# Patient Record
Sex: Male | Born: 1960 | Race: Black or African American | Hispanic: No | Smoking: Former smoker
Health system: Southern US, Community
[De-identification: ages and names within clinical notes are randomized; demographics above are authoritative.]

## PROBLEM LIST (undated history)

## (undated) DIAGNOSIS — E785 Hyperlipidemia, unspecified: Secondary | ICD-10-CM

## (undated) DIAGNOSIS — F1910 Other psychoactive substance abuse, uncomplicated: Secondary | ICD-10-CM

## (undated) DIAGNOSIS — I639 Cerebral infarction, unspecified: Secondary | ICD-10-CM

## (undated) DIAGNOSIS — M1990 Unspecified osteoarthritis, unspecified site: Secondary | ICD-10-CM

## (undated) DIAGNOSIS — I10 Essential (primary) hypertension: Secondary | ICD-10-CM

## (undated) HISTORY — PX: KNEE SURGERY: SHX244

---

## 1999-01-26 ENCOUNTER — Encounter: Payer: Self-pay | Admitting: *Deleted

## 1999-01-26 ENCOUNTER — Emergency Department (HOSPITAL_COMMUNITY): Admission: EM | Admit: 1999-01-26 | Discharge: 1999-01-26 | Payer: Self-pay | Admitting: Emergency Medicine

## 1999-02-03 ENCOUNTER — Emergency Department (HOSPITAL_COMMUNITY): Admission: EM | Admit: 1999-02-03 | Discharge: 1999-02-03 | Payer: Self-pay | Admitting: Emergency Medicine

## 2013-02-28 ENCOUNTER — Emergency Department (HOSPITAL_COMMUNITY)
Admission: EM | Admit: 2013-02-28 | Discharge: 2013-02-28 | Disposition: A | Discharge status: Home / Self Care | Payer: PRIVATE HEALTH INSURANCE | Source: Home / Self Care

## 2013-02-28 ENCOUNTER — Encounter (HOSPITAL_COMMUNITY): Payer: Self-pay | Admitting: *Deleted

## 2013-02-28 DIAGNOSIS — H109 Unspecified conjunctivitis: Secondary | ICD-10-CM

## 2013-02-28 DIAGNOSIS — J309 Allergic rhinitis, unspecified: Secondary | ICD-10-CM

## 2013-02-28 DIAGNOSIS — J0100 Acute maxillary sinusitis, unspecified: Secondary | ICD-10-CM

## 2013-02-28 MED ORDER — FEXOFENADINE HCL 180 MG PO TABS: 180.0000 mg | ORAL_TABLET | Freq: Every day | ORAL | Status: DC

## 2013-02-28 MED ORDER — CEFUROXIME AXETIL 500 MG PO TABS: 500.0000 mg | ORAL_TABLET | Freq: Two times a day (BID) | ORAL | Status: DC

## 2013-02-28 MED ORDER — KETOTIFEN FUMARATE 0.025 % OP SOLN: 1.0000 [drp] | Freq: Two times a day (BID) | OPHTHALMIC | Status: DC

## 2013-02-28 NOTE — ED Notes (Signed)
Pt  Reo=ports  Symptoms  Of  Cough        Sinus  Congested  With  Foul  Drainage  Returned            He  Reports  r  Side  Of face  Is  Swollen and       He  Reports  A  History of  Sinus   Problems in past  He    denys  Any toothache  Or any  Dental pain

## 2013-02-28 NOTE — ED Provider Notes (Signed)
CSN: [id number]     Arrival date & time 02/28/13  1236 History   First MD Initiated Contact with Patient 02/28/13 1410     Chief Complaint  Patient presents with  . Facial Pain   (Consider location/radiation/quality/duration/timing/severity/associated sxs/prior Treatment) HPI Comments: 52 year old male presents with right maxillary facial pain and swelling for the past 2 days. It is worse when he is a time. This was preceded by typical allergy symptoms watery asked, NT asked, runny nose, nasal stuffiness,  and a foul tasting and smelling PND.   History reviewed. No pertinent past medical history. Past Surgical History  Procedure Laterality Date  . Knee surgery     History reviewed. No pertinent family history. History  Substance Use Topics  . Smoking status: Current Every Day Smoker  . Smokeless tobacco: Not on file  . Alcohol Use: Yes    Review of Systems  Constitutional: Positive for activity change. Negative for fever, diaphoresis and fatigue.  HENT: Positive for congestion, sore throat, facial swelling, rhinorrhea, postnasal drip and sinus pressure. Negative for ear pain, trouble swallowing, neck pain and neck stiffness.   Eyes: Negative for pain, discharge and redness.  Respiratory: Positive for cough. Negative for chest tightness and shortness of breath.   Cardiovascular: Negative.   Gastrointestinal: Negative.   Musculoskeletal: Negative.   Skin: Negative for rash.  Neurological: Negative.     Allergies  Review of patient's allergies indicates no known allergies.  Home Medications   Current Outpatient Rx  Name  Route  Sig  Dispense  Refill  . cefUROXime (CEFTIN) 500 MG tablet   Oral   Take 1 tablet (500 mg total) by mouth 2 (two) times daily.   20 tablet   0   . fexofenadine (ALLEGRA) 180 MG tablet   Oral   Take 1 tablet (180 mg total) by mouth daily.   30 tablet   0   . ketotifen (ZADITOR) 0.025 % ophthalmic solution   Both Eyes   Place 1 drop into  both eyes 2 (two) times daily.   5 mL   0    BP 143/81  Pulse 72  Temp(Src) 98.7 F (37.1 C) (Oral)  Resp 16  SpO2 100% Physical Exam  Nursing note and vitals reviewed. Constitutional: He is oriented to person, place, and time. He appears well-developed and well-nourished. No distress.  HENT:  Bilateral TMs are normal. Oropharynx is a combination of brown, erythema and greenish PND. Swelling of the right face primarily over the maxilla.  Eyes: EOM are normal. Pupils are equal, round, and reactive to light.  Bilateral conjunctiva with minor erythema and covered with a clear mucoid liquid.  Neck: Normal range of motion. Neck supple.  Cardiovascular: Normal rate, regular rhythm and normal heart sounds.   Pulmonary/Chest: Effort normal and breath sounds normal. No respiratory distress. He has no wheezes.  Musculoskeletal: Normal range of motion. He exhibits no edema.  Lymphadenopathy:    He has no cervical adenopathy.  Neurological: He is alert and oriented to person, place, and time.  Skin: Skin is warm and dry. No rash noted.  Psychiatric: He has a normal mood and affect.    ED Course  Procedures (including critical care time) Labs Review Labs Reviewed - No data to display Imaging Review No results found.  MDM   1. Sinusitis, acute maxillary   2. Allergic rhinitis due to allergen   3. Conjunctivitis of both eyes      Allegra 180 mg daily Continue the  Sudafed PE 10 mg daily 4 hours when necessary Ceftin 500 mg twice a day for 10 days Zaditor ophthalmic solution one drop each eye twice a day  Hayden Rasmussen, NP 02/28/13 1443

## 2013-03-01 ENCOUNTER — Telehealth (HOSPITAL_COMMUNITY): Payer: Self-pay | Admitting: Emergency Medicine

## 2013-03-01 NOTE — ED Notes (Signed)
Patient requesting change in antibiotic, to cheaper antibiotic.  Nathan Rasmussen, np agreed to change.  Nathan Rasmussen, np ordered augmentin 875mg  1 po bid x 10days, #20.  [facility] at [phone].  Called and left message on patient's cell phone [phone]

## 2013-03-01 NOTE — ED Provider Notes (Signed)
Medical screening examination/treatment/procedure(s) were performed by resident physician or non-physician practitioner and as supervising physician I was immediately available for consultation/collaboration.   KINDL,JAMES DOUGLAS MD.   James D Kindl, MD 03/01/13 0936 

## 2013-03-22 ENCOUNTER — Ambulatory Visit: Payer: Self-pay

## 2013-04-05 ENCOUNTER — Encounter: Payer: Self-pay | Admitting: Internal Medicine

## 2013-04-05 ENCOUNTER — Ambulatory Visit: Payer: PRIVATE HEALTH INSURANCE | Attending: Internal Medicine | Admitting: Internal Medicine

## 2013-04-05 VITALS — BP 186/110 | HR 69 | Temp 98.2°F | Resp 16 | Ht 72.0 in | Wt 170.0 lb

## 2013-04-05 DIAGNOSIS — Z7189 Other specified counseling: Secondary | ICD-10-CM

## 2013-04-05 DIAGNOSIS — M25569 Pain in unspecified knee: Secondary | ICD-10-CM | POA: Insufficient documentation

## 2013-04-05 DIAGNOSIS — I10 Essential (primary) hypertension: Secondary | ICD-10-CM | POA: Insufficient documentation

## 2013-04-05 DIAGNOSIS — Z7689 Persons encountering health services in other specified circumstances: Secondary | ICD-10-CM | POA: Insufficient documentation

## 2013-04-05 DIAGNOSIS — M25562 Pain in left knee: Secondary | ICD-10-CM

## 2013-04-05 DIAGNOSIS — H547 Unspecified visual loss: Secondary | ICD-10-CM

## 2013-04-05 LAB — CMP AND LIVER
ALT: 18 U/L (ref 0–53)
AST: 22 U/L (ref 0–37)
Albumin: 4.7 g/dL (ref 3.5–5.2)
Alkaline Phosphatase: 70 U/L (ref 39–117)
BUN: 5 mg/dL — ABNORMAL LOW (ref 6–23)
Bilirubin, Direct: 0.1 mg/dL (ref 0.0–0.3)
CO2: 25 mEq/L (ref 19–32)
Calcium: 9.7 mg/dL (ref 8.4–10.5)
Chloride: 102 mEq/L (ref 96–112)
Creat: 0.81 mg/dL (ref 0.50–1.35)
Glucose, Bld: 85 mg/dL (ref 70–99)
Indirect Bilirubin: 0.5 mg/dL (ref 0.0–0.9)
Potassium: 3.7 mEq/L (ref 3.5–5.3)
Sodium: 139 mEq/L (ref 135–145)
Total Bilirubin: 0.6 mg/dL (ref 0.3–1.2)
Total Protein: 8.1 g/dL (ref 6.0–8.3)

## 2013-04-05 LAB — CBC WITH DIFFERENTIAL/PLATELET
Basophils Absolute: 0 10*3/uL (ref 0.0–0.1)
Basophils Relative: 1 % (ref 0–1)
Eosinophils Absolute: 0.3 10*3/uL (ref 0.0–0.7)
Eosinophils Relative: 4 % (ref 0–5)
HCT: 43.5 % (ref 39.0–52.0)
Hemoglobin: 15.2 g/dL (ref 13.0–17.0)
Lymphocytes Relative: 33 % (ref 12–46)
Lymphs Abs: 2.5 10*3/uL (ref 0.7–4.0)
MCH: 28.5 pg (ref 26.0–34.0)
MCHC: 34.9 g/dL (ref 30.0–36.0)
MCV: 81.6 fL (ref 78.0–100.0)
Monocytes Absolute: 1.1 10*3/uL — ABNORMAL HIGH (ref 0.1–1.0)
Monocytes Relative: 14 % — ABNORMAL HIGH (ref 3–12)
Neutro Abs: 3.8 10*3/uL (ref 1.7–7.7)
Neutrophils Relative %: 48 % (ref 43–77)
Platelets: 328 10*3/uL (ref 150–400)
RBC: 5.33 MIL/uL (ref 4.22–5.81)
RDW: 15.3 % (ref 11.5–15.5)
WBC: 7.7 10*3/uL (ref 4.0–10.5)

## 2013-04-05 LAB — LIPID PANEL
Cholesterol: 256 mg/dL — ABNORMAL HIGH (ref 0–200)
HDL: 73 mg/dL (ref >39)
LDL Cholesterol: 163 mg/dL — ABNORMAL HIGH (ref 0–99)
Total CHOL/HDL Ratio: 3.5 Ratio
Triglycerides: 101 mg/dL (ref <150)
VLDL: 20 mg/dL (ref 0–40)

## 2013-04-05 MED ORDER — NAPROXEN 500 MG PO TABS: 500.0000 mg | ORAL_TABLET | Freq: Two times a day (BID) | ORAL | Status: DC

## 2013-04-05 NOTE — Progress Notes (Signed)
Pt is here today to establish care. Pt reports having sciatic nerve pain. Pt had orthoscopic SX on both knees years ago and he is now having pain and having knee trouble recently.

## 2013-04-05 NOTE — Patient Instructions (Signed)
DASH Diet The DASH diet stands for "Dietary Approaches to Stop Hypertension." It is a healthy eating plan that has been shown to reduce high blood pressure (hypertension) in as little as 14 days, while also possibly providing other significant health benefits. These other health benefits include reducing the risk of breast cancer after menopause and reducing the risk of type 2 diabetes, heart disease, colon cancer, and stroke. Health benefits also include weight loss and slowing kidney failure in patients with chronic kidney disease.  DIET GUIDELINES  Limit salt (sodium). Your diet should contain less than 1500 mg of sodium daily.  Limit refined or processed carbohydrates. Your diet should include mostly whole grains. Desserts and added sugars should be used sparingly.  Include small amounts of heart-healthy fats. These types of fats include nuts, oils, and tub margarine. Limit saturated and trans fats. These fats have been shown to be harmful in the body. CHOOSING FOODS  The following food groups are based on a 2000 calorie diet. See your Registered Dietitian for individual calorie needs. Grains and Grain Products (6 to 8 servings daily)  Eat More Often: Whole-wheat bread, brown rice, whole-grain or wheat pasta, quinoa, popcorn without added fat or salt (air popped).  Eat Less Often: White bread, white pasta, white rice, cornbread. Vegetables (4 to 5 servings daily)  Eat More Often: Fresh, frozen, and canned vegetables. Vegetables may be raw, steamed, roasted, or grilled with a minimal amount of fat.  Eat Less Often/Avoid: Creamed or fried vegetables. Vegetables in a cheese sauce. Fruit (4 to 5 servings daily)  Eat More Often: All fresh, canned (in natural juice), or frozen fruits. Dried fruits without added sugar. One hundred percent fruit juice ( cup [237 mL] daily).  Eat Less Often: Dried fruits with added sugar. Canned fruit in light or heavy syrup. [employer], Fish, and Poultry (2  servings or less daily. One serving is 3 to 4 oz [85-114 g]).  Eat More Often: Ninety percent or leaner ground beef, tenderloin, sirloin. Round cuts of beef, chicken breast, [place] breast. All fish. Grill, bake, or broil your meat. Nothing should be fried.  Eat Less Often/Avoid: Fatty cuts of meat, [place], or chicken leg, thigh, or wing. Fried cuts of meat or fish. Dairy (2 to 3 servings)  Eat More Often: Low-fat or fat-free milk, low-fat plain or light yogurt, reduced-fat or part-skim cheese.  Eat Less Often/Avoid: Milk (whole, 2%).Whole milk yogurt. Full-fat cheeses. Nuts, Seeds, and Legumes (4 to 5 servings per week)  Eat More Often: All without added salt.  Eat Less Often/Avoid: Salted nuts and seeds, canned beans with added salt. Fats and Sweets (limited)  Eat More Often: Vegetable oils, tub margarines without trans fats, sugar-free gelatin. Mayonnaise and salad dressings.  Eat Less Often/Avoid: Coconut oils, palm oils, butter, stick margarine, cream, half and half, cookies, candy, pie. FOR MORE INFORMATION The Dash Diet Eating Plan: www.dashdiet.org Document Released: 05/12/2011 Document Revised: 08/15/2011 Document Reviewed: 05/12/2011 [facility] Patient Information 2014 [place], [place]. Knee Pain The knee is the complex joint between your thigh and your lower leg. It is made up of bones, tendons, ligaments, and cartilage. The bones that make up the knee are:  The femur in the thigh.  The tibia and fibula in the lower leg.  The patella or kneecap riding in the groove on the lower femur. CAUSES  Knee pain is a common complaint with many causes. A few of these causes are:  Injury, such as:  A ruptured ligament or tendon  injury.  Torn cartilage.  Medical conditions, such as:  Gout  Arthritis  Infections  Overuse, over training or overdoing a physical activity. Knee pain can be minor or severe. Knee pain can accompany debilitating injury. Minor knee problems often  respond well to self-care measures or get well on their own. More serious injuries may need medical intervention or even surgery. SYMPTOMS The knee is complex. Symptoms of knee problems can vary widely. Some of the problems are:  Pain with movement and weight bearing.  Swelling and tenderness.  Buckling of the knee.  Inability to straighten or extend your knee.  Your knee locks and you cannot straighten it.  Warmth and redness with pain and fever.  Deformity or dislocation of the kneecap. DIAGNOSIS  Determining what is wrong may be very straight forward such as when there is an injury. It can also be challenging because of the complexity of the knee. Tests to make a diagnosis may include:  Your caregiver taking a history and doing a physical exam.  Routine X-rays can be used to rule out other problems. X-rays will not reveal a cartilage tear. Some injuries of the knee can be diagnosed by:  Arthroscopy a surgical technique by which a small video camera is inserted through tiny incisions on the sides of the knee. This procedure is used to examine and repair internal knee joint problems. Tiny instruments can be used during arthroscopy to repair the torn knee cartilage (meniscus).  Arthrography is a radiology technique. A contrast liquid is directly injected into the knee joint. Internal structures of the knee joint then become visible on X-ray film.  An MRI scan is a non x-ray radiology procedure in which magnetic fields and a computer produce two- or three-dimensional images of the inside of the knee. Cartilage tears are often visible using an MRI scanner. MRI scans have largely replaced arthrography in diagnosing cartilage tears of the knee.  Blood work.  Examination of the fluid that helps to lubricate the knee joint (synovial fluid). This is done by taking a sample out using a needle and a syringe. TREATMENT The treatment of knee problems depends on the cause. Some of these  treatments are:  Depending on the injury, proper casting, splinting, surgery or physical therapy care will be needed.  Give yourself adequate recovery time. Do not overuse your joints. If you begin to get sore during workout routines, back off. Slow down or do fewer repetitions.  For repetitive activities such as cycling or running, maintain your strength and nutrition.  Alternate muscle groups. For example if you are a weight lifter, work the upper body on one day and the lower body the next.  Either tight or weak muscles do not give the proper support for your knee. Tight or weak muscles do not absorb the stress placed on the knee joint. Keep the muscles surrounding the knee strong.  Take care of mechanical problems.  If you have flat feet, orthotics or special shoes may help. See your caregiver if you need help.  Arch supports, sometimes with wedges on the inner or outer aspect of the heel, can help. These can shift pressure away from the side of the knee most bothered by osteoarthritis.  A brace called an "unloader" brace also may be used to help ease the pressure on the most arthritic side of the knee.  If your caregiver has prescribed crutches, braces, wraps or ice, use as directed. The acronym for this is PRICE. This means protection, rest,  ice, compression and elevation.  Nonsteroidal anti-inflammatory drugs (NSAID's), can help relieve pain. But if taken immediately after an injury, they may actually increase swelling. Take NSAID's with food in your stomach. Stop them if you develop stomach problems. Do not take these if you have a history of ulcers, stomach pain or bleeding from the bowel. Do not take without your caregiver's approval if you have problems with fluid retention, heart failure, or kidney problems.  For ongoing knee problems, physical therapy may be helpful.  Glucosamine and chondroitin are over-the-counter dietary supplements. Both may help relieve the pain of  osteoarthritis in the knee. These medicines are different from the usual anti-inflammatory drugs. Glucosamine may decrease the rate of cartilage destruction.  Injections of a corticosteroid drug into your knee joint may help reduce the symptoms of an arthritis flare-up. They may provide pain relief that lasts a few months. You may have to wait a few months between injections. The injections do have a small increased risk of infection, water retention and elevated blood sugar levels.  Hyaluronic acid injected into damaged joints may ease pain and provide lubrication. These injections may work by reducing inflammation. A series of shots may give relief for as long as 6 months.  Topical painkillers. Applying certain ointments to your skin may help relieve the pain and stiffness of osteoarthritis. Ask your pharmacist for suggestions. Many over the-counter products are approved for temporary relief of arthritis pain.  In some countries, doctors often prescribe topical NSAID's for relief of chronic conditions such as arthritis and tendinitis. A review of treatment with NSAID creams found that they worked as well as oral medications but without the serious side effects. PREVENTION  Maintain a healthy weight. Extra pounds put more strain on your joints.  Get strong, stay limber. Weak muscles are a common cause of knee injuries. Stretching is important. Include flexibility exercises in your workouts.  Be smart about exercise. If you have osteoarthritis, chronic knee pain or recurring injuries, you may need to change the way you exercise. This does not mean you have to stop being active. If your knees ache after jogging or playing basketball, consider switching to swimming, water aerobics or other low-impact activities, at least for a few days a week. Sometimes limiting high-impact activities will provide relief.  Make sure your shoes fit well. Choose footwear that is right for your sport.  Protect your  knees. Use the proper gear for knee-sensitive activities. Use kneepads when playing volleyball or laying carpet. Buckle your seat belt every time you drive. Most shattered kneecaps occur in car accidents.  Rest when you are tired. SEEK MEDICAL CARE IF:  You have knee pain that is continual and does not seem to be getting better.  SEEK IMMEDIATE MEDICAL CARE IF:  Your knee joint feels hot to the touch and you have a high fever. MAKE SURE YOU:   Understand these instructions.  Will watch your condition.  Will get help right away if you are not doing well or get worse. Document Released: 03/20/2007 Document Revised: 08/15/2011 Document Reviewed: 03/20/2007 [facility] Patient Information 2014 [place], [place].

## 2013-04-05 NOTE — Progress Notes (Signed)
Patient ID: Nathan Hernandez, male   DOB: Dec 12, 1960, 52 y.o.   MRN: [id number] Patient Demographics  Nathan Hernandez, is a 52 y.o. male  WUJ:[id number]  NFA:[id number]  DOB - Aug 03, 1960  CC:  Chief Complaint  Patient presents with  . Establish Care       HPI: Nathan Hernandez is a 52 y.o. male here today to establish medical care. Patient has no significant past medical history. He is not on any chronic medication. His major complaint today is right knee pain. He has had arthroscopy in both knees long time ago, he claims to have had torn ligaments due to excessive sport at younger age, he did well following surgery until lately when the right knee begins to give him trouble, he now wears braces and uses ibuprofen when necessary. He claims never to have been diagnosed with high blood pressure no diabetes but there is family history of both, hypertension in his mother and diabetes in father. He smokes cigar and drinks alcohol at least 2 cans of beer daily Patient has No headache, No chest pain, No abdominal pain - No Nausea, No new weakness tingling or numbness, No Cough - SOB.  No Known Allergies History reviewed. No pertinent past medical history. Current Outpatient Prescriptions on File Prior to Visit  Medication Sig Dispense Refill  . cefUROXime (CEFTIN) 500 MG tablet Take 1 tablet (500 mg total) by mouth 2 (two) times daily.  20 tablet  0  . fexofenadine (ALLEGRA) 180 MG tablet Take 1 tablet (180 mg total) by mouth daily.  30 tablet  0  . ketotifen (ZADITOR) 0.025 % ophthalmic solution Place 1 drop into both eyes 2 (two) times daily.  5 mL  0   No current facility-administered medications on file prior to visit.   History reviewed. No pertinent family history. History   Social History  . Marital Status: Legally Separated    Spouse Name: N/A    Number of Children: N/A  . Years of Education: N/A   Occupational History  . Not on file.   Social History Main Topics  . Smoking  status: Current Every Day Smoker -- 1.00 packs/day    Types: Cigars  . Smokeless tobacco: Not on file  . Alcohol Use: Yes  . Drug Use: 7.00 per week    Special: Marijuana  . Sexual Activity: Not on file   Other Topics Concern  . Not on file   Social History Narrative  . No narrative on file    Review of Systems: Constitutional: Negative for fever, chills, diaphoresis, activity change, appetite change and fatigue. HENT: Negative for ear pain, nosebleeds, congestion, facial swelling, rhinorrhea, neck pain, neck stiffness and ear discharge.  Eyes: Negative for pain, discharge, redness, itching and visual disturbance. Respiratory: Negative for cough, choking, chest tightness, shortness of breath, wheezing and stridor.  Cardiovascular: Negative for chest pain, palpitations and leg swelling. Gastrointestinal: Negative for abdominal distention. Genitourinary: Negative for dysuria, urgency, frequency, hematuria, flank pain, decreased urine volume, difficulty urinating and dyspareunia.  Musculoskeletal: Negative for back pain, joint swelling, arthralgia and gait problem. Neurological: Negative for dizziness, tremors, seizures, syncope, facial asymmetry, speech difficulty, weakness, light-headedness, numbness and headaches.  Hematological: Negative for adenopathy. Does not bruise/bleed easily. Psychiatric/Behavioral: Negative for hallucinations, behavioral problems, confusion, dysphoric mood, decreased concentration and agitation.    Objective:   Filed Vitals:   04/05/13 1419  BP: 186/110  Pulse: 69  Temp: 98.2 F (36.8 C)  Resp: 16    Physical Exam: Constitutional: Patient  appears well-developed and well-nourished. No distress. HENT: Normocephalic, atraumatic, External right and left ear normal. Oropharynx is clear and moist.  Eyes: Conjunctivae and EOM are normal. PERRLA, no scleral icterus. Neck: Normal ROM. Neck supple. No JVD. No tracheal deviation. No thyromegaly. CVS: RRR,  S1/S2 +, no murmurs, no gallops, no carotid bruit.  Pulmonary: Effort and breath sounds normal, no stridor, rhonchi, wheezes, rales.  Abdominal: Soft. BS +, no distension, tenderness, rebound or guarding.  Musculoskeletal: Normal range of motion. No edema and no tenderness.  Lymphadenopathy: No lymphadenopathy noted, cervical, inguinal or axillary Neuro: Alert. Normal reflexes, muscle tone coordination. No cranial nerve deficit. Skin: Skin is warm and dry. No rash noted. Not diaphoretic. No erythema. No pallor. Psychiatric: Normal mood and affect. Behavior, judgment, thought content normal.  No results found for this basename: WBC, HGB, HCT, MCV, PLT   No results found for this basename: CREATININE, BUN, NA, K, CL, CO2    No results found for this basename: HGBA1C   Lipid Panel  No results found for this basename: chol, trig, hdl, cholhdl, vldl, ldlcalc       Assessment and plan:   Patient Active Problem List   Diagnosis Date Noted  . Establishing care with new doctor, encounter for 04/05/2013  . Poor vision 04/05/2013  . High blood pressure 04/05/2013  . Knee pain, acute 04/05/2013    Plan: Comprehensive metabolic panel CBC D. Complete urinalysis Lipid panel Hemoglobin A1c  Right Knee pain X-ray right knee  Naproxen 500 mg tablet by mouth twice a day  Patient counseled extensively about smoking cessation Patient counseled extensively about nutrition and exercise  Pre-hypertension Patient has been instructed to record ambulatory blood pressure and bring the log in one week, if blood pressure remains above 140/90, we'll start antihypertensive     Follow up in one week for blood pressure check   The patient was given clear instructions to go to ER or return to medical center if symptoms don't improve, worsen or new problems develop. The patient verbalized understanding. The patient was told to call to get lab results if they haven't heard anything in the next week.      Jeanann Lewandowsky, MD, MHA, FACP, FAAP [facility] [place], [place] [phone]   04/05/2013, 2:54 PM

## 2013-04-06 LAB — URINALYSIS, COMPLETE
Bacteria, UA: NONE SEEN
Bilirubin Urine: NEGATIVE
Casts: NONE SEEN
Crystals: NONE SEEN
Glucose, UA: NEGATIVE mg/dL
Hgb urine dipstick: NEGATIVE
Ketones, ur: NEGATIVE mg/dL
Leukocytes, UA: NEGATIVE
Nitrite: NEGATIVE
Protein, ur: NEGATIVE mg/dL
Specific Gravity, Urine: 1.005 (ref 1.005–1.030)
Urobilinogen, UA: 0.2 mg/dL (ref 0.0–1.0)
pH: 6.5 (ref 5.0–8.0)

## 2013-04-06 LAB — VITAMIN D 25 HYDROXY (VIT D DEFICIENCY, FRACTURES): Vit D, 25-Hydroxy: 11 ng/mL — ABNORMAL LOW (ref 30–89)

## 2013-07-03 ENCOUNTER — Ambulatory Visit: Payer: Self-pay

## 2013-07-05 ENCOUNTER — Ambulatory Visit: Payer: Self-pay | Admitting: Internal Medicine

## 2013-07-29 ENCOUNTER — Ambulatory Visit: Payer: No Typology Code available for payment source | Attending: Internal Medicine | Admitting: Internal Medicine

## 2013-07-29 ENCOUNTER — Encounter: Payer: Self-pay | Admitting: Internal Medicine

## 2013-07-29 ENCOUNTER — Ambulatory Visit: Payer: No Typology Code available for payment source | Attending: Internal Medicine

## 2013-07-29 VITALS — BP 146/84 | HR 62 | Temp 98.7°F | Resp 14 | Ht 72.0 in | Wt 178.0 lb

## 2013-07-29 DIAGNOSIS — M25561 Pain in right knee: Secondary | ICD-10-CM

## 2013-07-29 DIAGNOSIS — Z0000 Encounter for general adult medical examination without abnormal findings: Secondary | ICD-10-CM

## 2013-07-29 DIAGNOSIS — M545 Low back pain, unspecified: Secondary | ICD-10-CM | POA: Insufficient documentation

## 2013-07-29 DIAGNOSIS — F17200 Nicotine dependence, unspecified, uncomplicated: Secondary | ICD-10-CM | POA: Insufficient documentation

## 2013-07-29 DIAGNOSIS — M25569 Pain in unspecified knee: Secondary | ICD-10-CM | POA: Insufficient documentation

## 2013-07-29 DIAGNOSIS — Z79899 Other long term (current) drug therapy: Secondary | ICD-10-CM | POA: Insufficient documentation

## 2013-07-29 MED ORDER — MELOXICAM 15 MG PO TABS: 15.0000 mg | ORAL_TABLET | Freq: Every day | ORAL | Status: DC

## 2013-07-29 NOTE — Progress Notes (Signed)
Patient is here for a follow up and check up. Complains of Rt knee pain x4 years. Has not went for imaging for his knee yet. Has some stiffness in knee, but no real pain. Pain only occurs while walking or sitting too long. Pain spreads from lower back down to Rt leg.

## 2013-07-29 NOTE — Progress Notes (Signed)
Patient ID: Nathan Hernandez, male   DOB: 09-05-1960, 53 y.o.   MRN: [id number]   CC:  HPI: Patient here for followup of right knee pain, currently taking ibuprofen as needed. The patient complains of pain in his lower back as well as his hip. Denies any numbness or tingling in his leg.  No Known Allergies No past medical history on file. Current Outpatient Prescriptions on File Prior to Visit  Medication Sig Dispense Refill  . cefUROXime (CEFTIN) 500 MG tablet Take 1 tablet (500 mg total) by mouth 2 (two) times daily.  20 tablet  0  . fexofenadine (ALLEGRA) 180 MG tablet Take 1 tablet (180 mg total) by mouth daily.  30 tablet  0  . ketotifen (ZADITOR) 0.025 % ophthalmic solution Place 1 drop into both eyes 2 (two) times daily.  5 mL  0  . naproxen (NAPROSYN) 500 MG tablet Take 1 tablet (500 mg total) by mouth 2 (two) times daily with a meal.  30 tablet  0   No current facility-administered medications on file prior to visit.   No family history on file. History   Social History  . Marital Status: Legally Separated    Spouse Name: N/A    Number of Children: N/A  . Years of Education: N/A   Occupational History  . Not on file.   Social History Main Topics  . Smoking status: Current Every Day Smoker -- 1.00 packs/day    Types: Cigars  . Smokeless tobacco: Not on file  . Alcohol Use: Yes  . Drug Use: 7.00 per week    Special: Marijuana  . Sexual Activity: Not on file   Other Topics Concern  . Not on file   Social History Narrative  . No narrative on file    Review of Systems  Constitutional: Negative for fever, chills, diaphoresis, activity change, appetite change and fatigue.  HENT: Negative for ear pain, nosebleeds, congestion, facial swelling, rhinorrhea, neck pain, neck stiffness and ear discharge.   Eyes: Negative for pain, discharge, redness, itching and visual disturbance.  Respiratory: Negative for cough, choking, chest tightness, shortness of breath, wheezing  and stridor.   Cardiovascular: Negative for chest pain, palpitations and leg swelling.  Gastrointestinal: Negative for abdominal distention.  Genitourinary: Negative for dysuria, urgency, frequency, hematuria, flank pain, decreased urine volume, difficulty urinating and dyspareunia.  Musculoskeletal: Negative for back pain, joint swelling, arthralgias and gait problem.  Neurological: Negative for dizziness, tremors, seizures, syncope, facial asymmetry, speech difficulty, weakness, light-headedness, numbness and headaches.  Hematological: Negative for adenopathy. Does not bruise/bleed easily.  Psychiatric/Behavioral: Negative for hallucinations, behavioral problems, confusion, dysphoric mood, decreased concentration and agitation.    Objective:   Filed Vitals:   07/29/13 1021  BP: 146/84  Pulse: 62  Temp: 98.7 F (37.1 C)  Resp: 14    Physical Exam  Constitutional: Appears well-developed and well-nourished. No distress.  HENT: Normocephalic. External right and left ear normal. Oropharynx is clear and moist.  Eyes: Conjunctivae and EOM are normal. PERRLA, no scleral icterus.  Neck: Normal ROM. Neck supple. No JVD. No tracheal deviation. No thyromegaly.  CVS: RRR, S1/S2 +, no murmurs, no gallops, no carotid bruit.  Pulmonary: Effort and breath sounds normal, no stridor, rhonchi, wheezes, rales.  Abdominal: Soft. BS +,  no distension, tenderness, rebound or guarding.  Musculoskeletal: Normal range of motion. No edema and no tenderness.  Lymphadenopathy: No lymphadenopathy noted, cervical, inguinal. Neuro: Alert. Normal reflexes, muscle tone coordination. No cranial nerve deficit. Skin: Skin is  warm and dry. No rash noted. Not diaphoretic. No erythema. No pallor.  Psychiatric: Normal mood and affect. Behavior, judgment, thought content normal.   Lab Results  Component Value Date   WBC 7.7 04/05/2013   HGB 15.2 04/05/2013   HCT 43.5 04/05/2013   MCV 81.6 04/05/2013   PLT 328  04/05/2013   Lab Results  Component Value Date   CREATININE 0.81 04/05/2013   BUN 5* 04/05/2013   NA 139 04/05/2013   K 3.7 04/05/2013   CL 102 04/05/2013   CO2 25 04/05/2013    No results found for this basename: HGBA1C   Lipid Panel     Component Value Date/Time   CHOL 256* 04/05/2013 1456   TRIG 101 04/05/2013 1456   HDL 73 04/05/2013 1456   CHOLHDL 3.5 04/05/2013 1456   VLDL 20 04/05/2013 1456   LDLCALC 163* 04/05/2013 1456       Assessment and plan:   Patient Active Problem List   Diagnosis Date Noted  . Establishing care with new doctor, encounter for 04/05/2013  . Poor vision 04/05/2013  . High blood pressure 04/05/2013  . Knee pain, acute 04/05/2013   Right knee pain Patient had arthroscopic surgery in the 1990s    Will obtain MRI of the right knee X-rays of the lumbar spine and right hip X. Start the patient on meloxicam Sports medicine referral possible corticosteroid injection in the joint  Follow up in 3 months   The patient was given clear instructions to go to ER or return to medical center if symptoms don't improve, worsen or new problems develop. The patient verbalized understanding. The patient was told to call to get any lab results if not heard anything in the next week.

## 2013-08-12 ENCOUNTER — Ambulatory Visit (HOSPITAL_COMMUNITY): Admission: RE | Admit: 2013-08-12 | Payer: Self-pay | Source: Ambulatory Visit

## 2013-10-24 ENCOUNTER — Ambulatory Visit: Payer: Self-pay | Admitting: Internal Medicine

## 2013-11-06 ENCOUNTER — Ambulatory Visit: Payer: Self-pay | Admitting: Internal Medicine

## 2013-11-12 ENCOUNTER — Ambulatory Visit (HOSPITAL_COMMUNITY)
Admission: RE | Admit: 2013-11-12 | Discharge: 2013-11-12 | Disposition: A | Discharge status: Home / Self Care | Payer: No Typology Code available for payment source | Source: Ambulatory Visit | Attending: Internal Medicine | Admitting: Internal Medicine

## 2013-11-12 DIAGNOSIS — M1710 Unilateral primary osteoarthritis, unspecified knee: Secondary | ICD-10-CM | POA: Insufficient documentation

## 2013-11-12 DIAGNOSIS — M25561 Pain in right knee: Secondary | ICD-10-CM

## 2013-11-12 DIAGNOSIS — IMO0002 Reserved for concepts with insufficient information to code with codable children: Secondary | ICD-10-CM | POA: Insufficient documentation

## 2013-11-14 ENCOUNTER — Encounter: Payer: Self-pay | Admitting: Internal Medicine

## 2013-11-14 ENCOUNTER — Ambulatory Visit: Payer: No Typology Code available for payment source | Attending: Internal Medicine | Admitting: Internal Medicine

## 2013-11-14 VITALS — BP 128/76 | HR 73 | Temp 97.4°F | Resp 16 | Ht 72.0 in | Wt 167.8 lb

## 2013-11-14 DIAGNOSIS — M7650 Patellar tendinitis, unspecified knee: Secondary | ICD-10-CM | POA: Insufficient documentation

## 2013-11-14 DIAGNOSIS — M6780 Other specified disorders of synovium and tendon, unspecified site: Secondary | ICD-10-CM

## 2013-11-14 DIAGNOSIS — F17200 Nicotine dependence, unspecified, uncomplicated: Secondary | ICD-10-CM | POA: Insufficient documentation

## 2013-11-14 DIAGNOSIS — M719 Bursopathy, unspecified: Secondary | ICD-10-CM

## 2013-11-14 DIAGNOSIS — IMO0002 Reserved for concepts with insufficient information to code with codable children: Secondary | ICD-10-CM

## 2013-11-14 DIAGNOSIS — M6790 Unspecified disorder of synovium and tendon, unspecified site: Secondary | ICD-10-CM

## 2013-11-14 DIAGNOSIS — M1710 Unilateral primary osteoarthritis, unspecified knee: Secondary | ICD-10-CM

## 2013-11-14 DIAGNOSIS — M25569 Pain in unspecified knee: Secondary | ICD-10-CM | POA: Insufficient documentation

## 2013-11-14 NOTE — Progress Notes (Signed)
Patient here for right knee pain f/u.  MRI of right knee done 11/12/2013.

## 2013-11-14 NOTE — Patient Instructions (Signed)
Osteoarthritis Osteoarthritis is a disease that causes soreness and swelling (inflammation) of a joint. It occurs when the cartilage at the affected joint wears down. Cartilage acts as a cushion, covering the ends of bones where they meet to form a joint. Osteoarthritis is the most common form of arthritis. It often occurs in older people. The joints affected most often by this condition include those in the:  Ends of the fingers.  Thumbs.  Neck.  Lower back.  Knees.  Hips. CAUSES  Over time, the cartilage that covers the ends of bones begins to wear away. This causes bone to rub on bone, producing pain and stiffness in the affected joints.  RISK FACTORS Certain factors can increase your chances of having osteoarthritis, including:  Older age.  Excessive body weight.  Overuse of joints. SIGNS AND SYMPTOMS   Pain, swelling, and stiffness in the joint.  Over time, the joint may lose its normal shape.  Small deposits of bone (osteophytes) may grow on the edges of the joint.  Bits of bone or cartilage can break off and float inside the joint space. This may cause more pain and damage. DIAGNOSIS  Your health care provider will do a physical exam and ask about your symptoms. Various tests may be ordered, such as:  X-rays of the affected joint.  An MRI scan.  Blood tests to rule out other types of arthritis.  Joint fluid tests. This involves using a needle to draw fluid from the joint and examining the fluid under a microscope. TREATMENT  Goals of treatment are to control pain and improve joint function. Treatment plans may include:  A prescribed exercise program that allows for rest and joint relief.  A weight control plan.  Pain relief techniques, such as:  Properly applied heat and cold.  Electric pulses delivered to nerve endings under the skin (transcutaneous electrical nerve stimulation, TENS).  Massage.  Certain nutritional supplements.  Medicines to  control pain, such as:  Acetaminophen.  Nonsteroidal anti-inflammatory drugs (NSAIDs), such as naproxen.  Narcotic or central-acting agents, such as tramadol.  Corticosteroids. These can be given orally or as an injection.  Surgery to reposition the bones and relieve pain (osteotomy) or to remove loose pieces of bone and cartilage. Joint replacement may be needed in advanced states of osteoarthritis. HOME CARE INSTRUCTIONS   Only take over-the-counter or prescription medicines as directed by your health care provider. Take all medicines exactly as instructed.  Maintain a healthy weight. Follow your health care provider's instructions for weight control. This may include dietary instructions.  Exercise as directed. Your health care provider can recommend specific types of exercise. These may include:  Strengthening exercises These are done to strengthen the muscles that support joints affected by arthritis. They can be performed with weights or with exercise bands to add resistance.  Aerobic activities These are exercises, such as brisk walking or low-impact aerobics, that get your heart pumping.  Range-of-motion activities These keep your joints limber.  Balance and agility exercises These help you maintain daily living skills.  Rest your affected joints as directed by your health care provider.  Follow up with your health care provider as directed. SEEK MEDICAL CARE IF:   Your skin turns red.  You develop a rash in addition to your joint pain.  You have worsening joint pain. SEEK IMMEDIATE MEDICAL CARE IF:  You have a significant loss of weight or appetite.  You have a fever along with joint or muscle aches.  You have   night sweats. FOR MORE INFORMATION  National Institute of Arthritis and Musculoskeletal and Skin Diseases: www.niams.nih.gov National Institute on Aging: www.nia.nih.gov American College of Rheumatology: www.rheumatology.org Document Released: 05/23/2005  Document Revised: 03/13/2013 Document Reviewed: 01/28/2013 [employer] Patient Information 2014 [employer].  

## 2013-11-14 NOTE — Progress Notes (Signed)
Patient ID: Nathan Hernandez, male   DOB: September 11, 1960, 53 y.o.   MRN: [id number]  CC: f/u knee pain  HPI:  Patient reports to clinic today for a follow up visit of knee pain.  Patient reports pain in bilateral knees but greater intensity in right knee.  Patient had MRI of knee completed two days ago and would like the results while he is here.  Patient reports that pain is manageable at this point and only causes him severe pain rarely.  He reports that it feels liek his knee bones are rubbing together.  He denies swelling, redness, warmth, or tenderness of knee.  He denies fever or chills.  No Known Allergies No past medical history on file. Current Outpatient Prescriptions on File Prior to Visit  Medication Sig Dispense Refill  . ibuprofen (ADVIL,MOTRIN) 100 MG tablet Take 100 mg by mouth every 6 (six) hours as needed for fever.      . cefUROXime (CEFTIN) 500 MG tablet Take 1 tablet (500 mg total) by mouth 2 (two) times daily.  20 tablet  0  . fexofenadine (ALLEGRA) 180 MG tablet Take 1 tablet (180 mg total) by mouth daily.  30 tablet  0  . ketotifen (ZADITOR) 0.025 % ophthalmic solution Place 1 drop into both eyes 2 (two) times daily.  5 mL  0  . meloxicam (MOBIC) 15 MG tablet Take 1 tablet (15 mg total) by mouth daily.  30 tablet  2  . naproxen (NAPROSYN) 500 MG tablet Take 1 tablet (500 mg total) by mouth 2 (two) times daily with a meal.  30 tablet  0   No current facility-administered medications on file prior to visit.   No family history on file. History   Social History  . Marital Status: Divorced    Spouse Name: N/A    Number of Children: N/A  . Years of Education: N/A   Occupational History  . Not on file.   Social History Main Topics  . Smoking status: Current Every Day Smoker -- 1.00 packs/day    Types: Cigars  . Smokeless tobacco: Not on file  . Alcohol Use: Yes  . Drug Use: 7.00 per week    Special: Marijuana  . Sexual Activity: Not on file   Other Topics Concern   . Not on file   Social History Narrative  . No narrative on file    Review of Systems: See HPI   Objective:   Filed Vitals:   11/14/13 1646  BP: 128/76  Pulse: 73  Temp: 97.4 F (36.3 C)  Resp: 16   Physical Exam  Vitals reviewed. Constitutional: He is oriented to person, place, and time.  Cardiovascular: Normal rate, regular rhythm and normal heart sounds.   Pulmonary/Chest: Effort normal and breath sounds normal.  Abdominal: Soft. Bowel sounds are normal.  Musculoskeletal: He exhibits no edema and no tenderness.  Pain and crepitus with bilateral knee ROM  Neurological: He is alert and oriented to person, place, and time. He has normal reflexes.     Lab Results  Component Value Date   WBC 7.7 04/05/2013   HGB 15.2 04/05/2013   HCT 43.5 04/05/2013   MCV 81.6 04/05/2013   PLT 328 04/05/2013   Lab Results  Component Value Date   CREATININE 0.81 04/05/2013   BUN 5* 04/05/2013   NA 139 04/05/2013   K 3.7 04/05/2013   CL 102 04/05/2013   CO2 25 04/05/2013    No results found for this basename: HGBA1C  Lipid Panel     Component Value Date/Time   CHOL 256* 04/05/2013 1456   TRIG 101 04/05/2013 1456   HDL 73 04/05/2013 1456   CHOLHDL 3.5 04/05/2013 1456   VLDL 20 04/05/2013 1456   LDLCALC 163* 04/05/2013 1456       Assessment and plan:   Nathan Hernandez was seen today for follow up knee pain.  Diagnoses and associated orders for this visit:  Tricompartmental disease of knee - Ambulatory referral to Orthopedic Surgery - Ambulatory referral to Sports Medicine Explained to patient that he may require injections or eventually a knee replacement Tendinosis Patient reports that he has been taking Naproxen as needed and refused any medication stronger today.        Holland Commons, NP-C [facility] [phone] 11/17/2013, 11:12 PM

## 2013-11-27 ENCOUNTER — Encounter: Payer: Self-pay | Admitting: Emergency Medicine

## 2013-11-27 ENCOUNTER — Ambulatory Visit (INDEPENDENT_AMBULATORY_CARE_PROVIDER_SITE_OTHER): Payer: No Typology Code available for payment source | Admitting: Emergency Medicine

## 2013-11-27 VITALS — BP 139/84 | Ht 72.0 in | Wt 170.0 lb

## 2013-11-27 DIAGNOSIS — M1710 Unilateral primary osteoarthritis, unspecified knee: Secondary | ICD-10-CM

## 2013-11-27 DIAGNOSIS — M25569 Pain in unspecified knee: Secondary | ICD-10-CM

## 2013-11-27 DIAGNOSIS — M25561 Pain in right knee: Secondary | ICD-10-CM

## 2013-11-27 DIAGNOSIS — M1711 Unilateral primary osteoarthritis, right knee: Secondary | ICD-10-CM | POA: Insufficient documentation

## 2013-11-27 MED ORDER — METHYLPREDNISOLONE ACETATE 40 MG/ML IJ SUSP
40.0000 mg | Freq: Once | INTRAMUSCULAR | Status: AC
  Administered 2013-11-27: 40 mg via INTRA_ARTICULAR

## 2013-11-27 MED ORDER — MELOXICAM 15 MG PO TABS: 15.0000 mg | ORAL_TABLET | Freq: Every day | ORAL | Status: DC

## 2013-11-27 NOTE — Progress Notes (Signed)
Patient ID: Nathan Hernandez, male   DOB: 07-May-1961, 53 y.o.   MRN: [id number] 53 year old male presents as referral from internal medicine for evaluation of knee pain and osteoarthritis. 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 He reports progressively increasing right knee pain for several years. Remote history of meniscectomy. Pain limits his activities. He used to work as a Customer service managerconcrete laborer however, he has been unable to do that secondary to knee pain. Pain limits his ability to run. His knee does swell and occasion. He also reports a mild left knee pain which is not very bothersome at this time. No swelling of his left knee.  Pertinent past medical history: Hypertension  Social history: Pack-a-day smoker occasional alcohol  Review of systems: As per history of present illness otherwise all systems negative  Examination: BP 139/84  Ht 6' (1.829 m)  Wt 170 lb (77.111 kg)  BMI 23.05 kg/m2 Well-developed well-nourished 53 year old [place] male awake alert oriented no acute distress Right Knee: Mild effusion noted on examination with bony irregularity of the medial joint line. Palpation with tenderness to the medial lateral joint lines. Also patellar crepitus and tenderness noted. ROM full in flexion and extension and lower leg rotation. Ligaments with solid consistent endpoints including ACL, PCL, LCL, MCL. Negative Mcmurray's, Apley's, and Thessalonian tests. Pain with patellar compression. Patellar glide with crepitus. Patellar and quadriceps tendons unremarkable. Hamstring and quadriceps strength is normal.   Examination of the left knee is unremarkable.  His recent MRI results were reviewed. These are consistent with tricompartmental osteoarthritis.  Procedure:  Injection of right knee Consent obtained and verified. Time-out conducted. Noted no overlying erythema, induration, or other signs of local infection. Skin prepped in a  sterile fashion. Topical analgesic spray: Ethyl chloride. Completed without difficulty. Meds: 80mg  depo/4cc lido Pain immediately improved suggesting accurate placement of the medication. Advised to call if fevers/chills, erythema, induration, drainage, or persistent bleeding.

## 2013-11-27 NOTE — Assessment & Plan Note (Signed)
Corticosteroid injection done today patient part of the procedure well. Meloxicam prescribed. Patient will followup as needed. We did discuss the merits of total knee replacement.

## 2013-11-27 NOTE — Assessment & Plan Note (Signed)
Corticosteroid injection was done today in the office. In addition he was given a prescription for meloxicam. He will followup as needed

## 2014-05-12 ENCOUNTER — Encounter: Payer: Self-pay | Admitting: Internal Medicine

## 2014-05-12 ENCOUNTER — Ambulatory Visit (HOSPITAL_BASED_OUTPATIENT_CLINIC_OR_DEPARTMENT_OTHER): Payer: Self-pay | Admitting: *Deleted

## 2014-05-12 ENCOUNTER — Ambulatory Visit: Payer: Self-pay | Attending: Internal Medicine | Admitting: Internal Medicine

## 2014-05-12 VITALS — BP 132/75 | HR 61 | Temp 97.6°F | Resp 16 | Ht 72.0 in | Wt 168.0 lb

## 2014-05-12 DIAGNOSIS — Z79899 Other long term (current) drug therapy: Secondary | ICD-10-CM | POA: Insufficient documentation

## 2014-05-12 DIAGNOSIS — M25511 Pain in right shoulder: Secondary | ICD-10-CM

## 2014-05-12 DIAGNOSIS — F17200 Nicotine dependence, unspecified, uncomplicated: Secondary | ICD-10-CM | POA: Insufficient documentation

## 2014-05-12 DIAGNOSIS — Z23 Encounter for immunization: Secondary | ICD-10-CM

## 2014-05-12 DIAGNOSIS — E785 Hyperlipidemia, unspecified: Secondary | ICD-10-CM

## 2014-05-12 DIAGNOSIS — Z0000 Encounter for general adult medical examination without abnormal findings: Secondary | ICD-10-CM

## 2014-05-12 MED ORDER — TRAMADOL HCL 50 MG PO TABS
50.0000 mg | ORAL_TABLET | Freq: Three times a day (TID) | ORAL | Status: DC | PRN
Start: 2014-05-12 — End: 2017-11-22

## 2014-05-12 MED ORDER — IBUPROFEN 600 MG PO TABS: 600.0000 mg | ORAL_TABLET | Freq: Three times a day (TID) | ORAL | Status: DC | PRN

## 2014-05-12 NOTE — Progress Notes (Signed)
Patient ID: Nathan Hernandez, male   DOB: 04-14-1961, 53 y.o.   MRN: [id number]  CC: shoulder pain  HPI: Nathan Hernandez is a 53 y.o. male with a past medical history of right knee osteoarthritis.  He present to clinic today for right shoulder pain for the past three months.  He reports that he fell down some stairs in his garage and he landed on his right side 2 months ago.  He reports that he never went to have it evaluated because he was not having pain at the time.  This right shoulder pain has been present for several years since he played sports when he was younger.  The pain has become a constant sharp pain that is limiting his ROM.  The pain is aggravated by attempting to lift above his head.  He states that he was giving cortisone injections in his right knee in the past and it did seem to help his pain.  He is wondering if he will need more injections.    No Known Allergies History reviewed. No pertinent past medical history. Current Outpatient Prescriptions on File Prior to Visit  Medication Sig Dispense Refill  . ibuprofen (ADVIL,MOTRIN) 100 MG tablet Take 100 mg by mouth every 6 (six) hours as needed for fever.    . cefUROXime (CEFTIN) 500 MG tablet Take 1 tablet (500 mg total) by mouth 2 (two) times daily. (Patient not taking: Reported on 05/12/2014) 20 tablet 0  . fexofenadine (ALLEGRA) 180 MG tablet Take 1 tablet (180 mg total) by mouth daily. (Patient not taking: Reported on 05/12/2014) 30 tablet 0  . ketotifen (ZADITOR) 0.025 % ophthalmic solution Place 1 drop into both eyes 2 (two) times daily. (Patient not taking: Reported on 05/12/2014) 5 mL 0  . meloxicam (MOBIC) 15 MG tablet Take 1 tablet (15 mg total) by mouth daily. (Patient not taking: Reported on 05/12/2014) 30 tablet 2  . meloxicam (MOBIC) 15 MG tablet Take 1 tablet (15 mg total) by mouth daily. (Patient not taking: Reported on 05/12/2014) 30 tablet 2  . naproxen (NAPROSYN) 500 MG tablet Take 1 tablet (500 mg total) by mouth 2  (two) times daily with a meal. (Patient not taking: Reported on 05/12/2014) 30 tablet 0   No current facility-administered medications on file prior to visit.   History reviewed. No pertinent family history. History   Social History  . Marital Status: Divorced    Spouse Name: N/A    Number of Children: N/A  . Years of Education: N/A   Occupational History  . Not on file.   Social History Main Topics  . Smoking status: Current Every Day Smoker -- 1.00 packs/day    Types: Cigars  . Smokeless tobacco: Not on file  . Alcohol Use: Yes  . Drug Use: 7.00 per week    Special: Marijuana  . Sexual Activity: Not on file   Other Topics Concern  . Not on file   Social History Narrative    Review of Systems  Musculoskeletal: Positive for joint pain.  All other systems reviewed and are negative.     Objective:   Filed Vitals:   05/12/14 1109  BP: 132/75  Pulse: 61  Temp: 97.6 F (36.4 C)  Resp: 16    Physical Exam  Constitutional: He is oriented to person, place, and time.  Cardiovascular: Normal rate, regular rhythm and normal heart sounds.   Pulmonary/Chest: Effort normal and breath sounds normal.  Musculoskeletal: He exhibits no tenderness.  Right shoulder: He exhibits decreased range of motion, pain and spasm. He exhibits no bony tenderness, no swelling, no crepitus and no deformity.  Neurological: He is alert and oriented to person, place, and time.  Skin: Skin is warm and dry.  Psychiatric: He has a normal mood and affect.     Lab Results  Component Value Date   WBC 7.7 04/05/2013   HGB 15.2 04/05/2013   HCT 43.5 04/05/2013   MCV 81.6 04/05/2013   PLT 328 04/05/2013   Lab Results  Component Value Date   CREATININE 0.81 04/05/2013   BUN 5* 04/05/2013   NA 139 04/05/2013   K 3.7 04/05/2013   CL 102 04/05/2013   CO2 25 04/05/2013    No results found for: HGBA1C Lipid Panel     Component Value Date/Time   CHOL 256* 04/05/2013 1456   TRIG 101  04/05/2013 1456   HDL 73 04/05/2013 1456   CHOLHDL 3.5 04/05/2013 1456   VLDL 20 04/05/2013 1456   LDLCALC 163* 04/05/2013 1456       Assessment and plan:   Nathan NeedleMichael was seen today for follow-up.  Diagnoses and associated orders for this visit:  Right shoulder pain - DG Shoulder Right; Future - ibuprofen (ADVIL,MOTRIN) 600 MG tablet; Take 1 tablet (600 mg total) by mouth every 8 (eight) hours as needed. - traMADol (ULTRAM) 50 MG tablet; Take 1 tablet (50 mg total) by mouth every 8 (eight) hours as needed. Stressed the use of heat and NSAID's.    Need for influenza vaccination Influenza injection received.  Explained side effects and contraindications to patient. Information sheet given to patient.  Preventative health care - Lipid panel; Future - COMPLETE METABOLIC PANEL WITH GFR; Future - CBC; Future - PSA; Future - Vitamin D, 25-hydroxy; Future ASCVD risk of 10.1% will call with statin  May follow up if symptoms do not improve. Will call patient with xray results.        Holland CommonsKECK, VALERIE, NP-C [facility] [phone] 05/12/2014, 11:43 AM

## 2014-05-12 NOTE — Patient Instructions (Signed)
Smoking Cessation Quitting smoking is important to your health and has many advantages. However, it is not always easy to quit since nicotine is a very addictive drug. Oftentimes, people try 3 times or more before being able to quit. This document explains the best ways for you to prepare to quit smoking. Quitting takes hard work and a lot of effort, but you can do it. ADVANTAGES OF QUITTING SMOKING  You will live longer, feel better, and live better.  Your body will feel the impact of quitting smoking almost immediately.  Within 20 minutes, blood pressure decreases. Your pulse returns to its normal level.  After 8 hours, carbon monoxide levels in the blood return to normal. Your oxygen level increases.  After 24 hours, the chance of having a heart attack starts to decrease. Your breath, hair, and body stop smelling like smoke.  After 48 hours, damaged nerve endings begin to recover. Your sense of taste and smell improve.  After 72 hours, the body is virtually free of nicotine. Your bronchial tubes relax and breathing becomes easier.  After 2 to 12 weeks, lungs can hold more air. Exercise becomes easier and circulation improves.  The risk of having a heart attack, stroke, cancer, or lung disease is greatly reduced.  After 1 year, the risk of coronary heart disease is cut in half.  After 5 years, the risk of stroke falls to the same as a nonsmoker.  After 10 years, the risk of lung cancer is cut in half and the risk of other cancers decreases significantly.  After 15 years, the risk of coronary heart disease drops, usually to the level of a nonsmoker.  If you are pregnant, quitting smoking will improve your chances of having a healthy baby.  The people you live with, especially any children, will be healthier.  You will have extra money to spend on things other than cigarettes. QUESTIONS TO THINK ABOUT BEFORE ATTEMPTING TO QUIT You may want to talk about your answers with your  health care provider.  Why do you want to quit?  If you tried to quit in the past, what helped and what did not?  What will be the most difficult situations for you after you quit? How will you plan to handle them?  Who can help you through the tough times? Your family? Friends? A health care provider?  What pleasures do you get from smoking? What ways can you still get pleasure if you quit? Here are some questions to ask your health care provider:  How can you help me to be successful at quitting?  What medicine do you think would be best for me and how should I take it?  What should I do if I need more help?  What is smoking withdrawal like? How can I get information on withdrawal? GET READY  Set a quit date.  Change your environment by getting rid of all cigarettes, ashtrays, matches, and lighters in your home, car, or work. Do not let people smoke in your home.  Review your past attempts to quit. Think about what worked and what did not. GET SUPPORT AND ENCOURAGEMENT You have a better chance of being successful if you have help. You can get support in many ways.  Tell your family, friends, and coworkers that you are going to quit and need their support. Ask them not to smoke around you.  Get individual, group, or telephone counseling and support. Programs are available at local hospitals and health centers. Call   your local health department for information about programs in your area.  Spiritual beliefs and practices may help some smokers quit.  Download a "quit meter" on your computer to keep track of quit statistics, such as how long you have gone without smoking, cigarettes not smoked, and money saved.  Get a self-help book about quitting smoking and staying off tobacco. LEARN NEW SKILLS AND BEHAVIORS  Distract yourself from urges to smoke. Talk to someone, go for a walk, or occupy your time with a task.  Change your normal routine. Take a different route to work.  Drink tea instead of coffee. Eat breakfast in a different place.  Reduce your stress. Take a hot bath, exercise, or read a book.  Plan something enjoyable to do every day. Reward yourself for not smoking.  Explore interactive web-based programs that specialize in helping you quit. GET MEDICINE AND USE IT CORRECTLY Medicines can help you stop smoking and decrease the urge to smoke. Combining medicine with the above behavioral methods and support can greatly increase your chances of successfully quitting smoking.  Nicotine replacement therapy helps deliver nicotine to your body without the negative effects and risks of smoking. Nicotine replacement therapy includes nicotine gum, lozenges, inhalers, nasal sprays, and skin patches. Some may be available over-the-counter and others require a prescription.  Antidepressant medicine helps people abstain from smoking, but how this works is unknown. This medicine is available by prescription.  Nicotinic receptor partial agonist medicine simulates the effect of nicotine in your brain. This medicine is available by prescription. Ask your health care provider for advice about which medicines to use and how to use them based on your health history. Your health care provider will tell you what side effects to look out for if you choose to be on a medicine or therapy. Carefully read the information on the package. Do not use any other product containing nicotine while using a nicotine replacement product.  RELAPSE OR DIFFICULT SITUATIONS Most relapses occur within the first 3 months after quitting. Do not be discouraged if you start smoking again. Remember, most people try several times before finally quitting. You may have symptoms of withdrawal because your body is used to nicotine. You may crave cigarettes, be irritable, feel very hungry, cough often, get headaches, or have difficulty concentrating. The withdrawal symptoms are only temporary. They are strongest  when you first quit, but they will go away within 10-14 days. To reduce the chances of relapse, try to:  Avoid drinking alcohol. Drinking lowers your chances of successfully quitting.  Reduce the amount of caffeine you consume. Once you quit smoking, the amount of caffeine in your body increases and can give you symptoms, such as a rapid heartbeat, sweating, and anxiety.  Avoid smokers because they can make you want to smoke.  Do not let weight gain distract you. Many smokers will gain weight when they quit, usually less than 10 pounds. Eat a healthy diet and stay active. You can always lose the weight gained after you quit.  Find ways to improve your mood other than smoking. FOR MORE INFORMATION  www.smokefree.gov  Document Released: 05/17/2001 Document Revised: 10/07/2013 Document Reviewed: 09/01/2011 Exit[employer] Patient Information 2015 [employer]. This information is not intended to replace advice given to you by your health care provider. Make sure you discuss any questions you have with your health care provider.  

## 2014-05-12 NOTE — Progress Notes (Signed)
Pt is here today c/o right shoulder pain for 3 months. Pt received a shot in his knee due to right knee pain and wants to get another one.

## 2014-05-15 ENCOUNTER — Ambulatory Visit: Payer: Self-pay | Attending: Internal Medicine

## 2014-05-15 DIAGNOSIS — Z0000 Encounter for general adult medical examination without abnormal findings: Secondary | ICD-10-CM

## 2014-05-15 LAB — COMPLETE METABOLIC PANEL WITH GFR
ALT: 13 U/L (ref 0–53)
AST: 22 U/L (ref 0–37)
Albumin: 4.6 g/dL (ref 3.5–5.2)
Alkaline Phosphatase: 63 U/L (ref 39–117)
BILIRUBIN TOTAL: 0.6 mg/dL (ref 0.2–1.2)
BUN: 8 mg/dL (ref 6–23)
CO2: 25 meq/L (ref 19–32)
CREATININE: 0.83 mg/dL (ref 0.50–1.35)
Calcium: 9.6 mg/dL (ref 8.4–10.5)
Chloride: 102 mEq/L (ref 96–112)
GFR, Est African American: >89 mL/min
Glucose, Bld: 91 mg/dL (ref 70–99)
Potassium: 4 mEq/L (ref 3.5–5.3)
Sodium: 138 mEq/L (ref 135–145)
Total Protein: 7.4 g/dL (ref 6.0–8.3)

## 2014-05-15 LAB — LIPID PANEL
CHOLESTEROL: 235 mg/dL — AB (ref 0–200)
HDL: 66 mg/dL (ref >39)
LDL CALC: 154 mg/dL — AB (ref 0–99)
Total CHOL/HDL Ratio: 3.6 Ratio
Triglycerides: 77 mg/dL (ref <150)
VLDL: 15 mg/dL (ref 0–40)

## 2014-05-15 LAB — CBC
HCT: 43.7 % (ref 39.0–52.0)
HEMOGLOBIN: 15 g/dL (ref 13.0–17.0)
MCH: 28.5 pg (ref 26.0–34.0)
MCHC: 34.3 g/dL (ref 30.0–36.0)
MCV: 83.1 fL (ref 78.0–100.0)
MPV: 9 fL — AB (ref 9.4–12.4)
Platelets: 262 10*3/uL (ref 150–400)
RBC: 5.26 MIL/uL (ref 4.22–5.81)
RDW: 14.9 % (ref 11.5–15.5)
WBC: 4.8 10*3/uL (ref 4.0–10.5)

## 2014-05-16 LAB — VITAMIN D 25 HYDROXY (VIT D DEFICIENCY, FRACTURES): VIT D 25 HYDROXY: 6 ng/mL — AB (ref 30–100)

## 2014-05-16 LAB — PSA: PSA: 1.93 ng/mL (ref <=4.00)

## 2014-05-20 ENCOUNTER — Telehealth: Payer: Self-pay | Admitting: Emergency Medicine

## 2014-05-20 DIAGNOSIS — E785 Hyperlipidemia, unspecified: Secondary | ICD-10-CM | POA: Insufficient documentation

## 2014-05-20 MED ORDER — ATORVASTATIN CALCIUM 20 MG PO TABS: 20.0000 mg | ORAL_TABLET | Freq: Every day | ORAL | Status: DC

## 2014-05-20 NOTE — Telephone Encounter (Signed)
Left message on VM pt lab results with instructions to start taking prescribed Lipitor 20 mg tab daily Medication e-scribed to [facility]

## 2014-05-20 NOTE — Telephone Encounter (Signed)
-----   Message from Ambrose FinlandValerie A Keck, NP sent at 05/20/2014  1:05 PM EST ----- Regarding: results Sorry I wrote a note stating that I wanted this patient on Lipitor 10 mg but I just calculated his risk of a cardiovascular event and I want him on Lipitor 20 mg.  I have already placed the prescription for it and it is sent to out pharmacy. I believe I wrote some other results on him as well so please read when you call patient. Thanks

## 2014-05-21 ENCOUNTER — Telehealth: Payer: Self-pay | Admitting: Emergency Medicine

## 2014-05-21 MED ORDER — VITAMIN D (ERGOCALCIFEROL) 1.25 MG (50000 UNIT) PO CAPS: 50000.0000 [IU] | ORAL_CAPSULE | ORAL | Status: DC

## 2014-05-21 NOTE — Telephone Encounter (Signed)
Left message with lab results and medication instructions to start Vitamin d 50,000 units once weekly x 12 weeks and Lipitor 10 mg tab daily to prevent heart disease Medications e-scribed to [facility] pharmacy

## 2014-05-21 NOTE — Telephone Encounter (Signed)
-----   Message from Ambrose FinlandValerie A Keck, NP sent at 05/18/2014 10:23 PM EST ----- Cholesterol continues to be elevated. Please send prescription for atorvastatin 10 mg daily. Please provide appropriate education regarding diet and exercise and medication compliance to prevent heart disease.  Vitamin D is very low.  Please send Drisdol 50,000 IU to take ONCE weekly for 12 weeks. 12 tables with no refills.

## 2014-07-16 ENCOUNTER — Other Ambulatory Visit: Payer: Self-pay | Admitting: Internal Medicine

## 2014-07-25 ENCOUNTER — Ambulatory Visit: Payer: Self-pay | Admitting: Internal Medicine

## 2014-08-01 ENCOUNTER — Ambulatory Visit: Payer: Self-pay | Attending: Internal Medicine | Admitting: Internal Medicine

## 2014-08-01 ENCOUNTER — Encounter: Payer: Self-pay | Admitting: Internal Medicine

## 2014-08-01 VITALS — BP 114/69 | HR 65 | Temp 97.9°F | Resp 16 | Ht 72.0 in | Wt 173.0 lb

## 2014-08-01 DIAGNOSIS — Z79899 Other long term (current) drug therapy: Secondary | ICD-10-CM | POA: Insufficient documentation

## 2014-08-01 DIAGNOSIS — M25511 Pain in right shoulder: Secondary | ICD-10-CM

## 2014-08-01 DIAGNOSIS — F17210 Nicotine dependence, cigarettes, uncomplicated: Secondary | ICD-10-CM | POA: Insufficient documentation

## 2014-08-01 DIAGNOSIS — M1711 Unilateral primary osteoarthritis, right knee: Secondary | ICD-10-CM

## 2014-08-01 DIAGNOSIS — M179 Osteoarthritis of knee, unspecified: Secondary | ICD-10-CM

## 2014-08-01 DIAGNOSIS — E785 Hyperlipidemia, unspecified: Secondary | ICD-10-CM

## 2014-08-01 MED ORDER — ATORVASTATIN CALCIUM 20 MG PO TABS: 20.0000 mg | ORAL_TABLET | Freq: Every day | ORAL | Status: DC

## 2014-08-01 MED ORDER — IBUPROFEN 600 MG PO TABS: 600.0000 mg | ORAL_TABLET | Freq: Three times a day (TID) | ORAL | Status: DC | PRN

## 2014-08-01 NOTE — Progress Notes (Signed)
Patient ID: Nathan Hernandez, male   DOB: 21-Feb-1961, 54 y.o.   MRN: [id number]  CC: medication refills  HPI: Nathan Hernandez is a 54 y.o. male with a past medical history of right knee osteoarthritis. He presents to clinic today for right shoulder pain for the past 4 months. Four months ago he fell down some stairs in his garage and he landed on his right side. This right shoulder pain has been present for several years since he played sports when he was younger. The pain has become a constant sharp pain that is limiting his ROM. The pain is aggravated by attempting to lift above his head. He was referred to Orthopedics last month. The ibuprofen has helped the most with his pain.    Patient has No headache, No chest pain, No abdominal pain - No Nausea, No new weakness tingling or numbness, No Cough - SOB.  No Known Allergies History reviewed. No pertinent past medical history. Current Outpatient Prescriptions on File Prior to Visit  Medication Sig Dispense Refill  . atorvastatin (LIPITOR) 20 MG tablet Take 1 tablet (20 mg total) by mouth daily at 6 PM. 30 tablet 3  . ibuprofen (ADVIL,MOTRIN) 600 MG tablet Take 1 tablet (600 mg total) by mouth every 8 (eight) hours as needed. 60 tablet 0  . traMADol (ULTRAM) 50 MG tablet Take 1 tablet (50 mg total) by mouth every 8 (eight) hours as needed. (Patient not taking: Reported on 08/01/2014) 30 tablet 0  . Vitamin D, Ergocalciferol, (DRISDOL) 50000 UNITS CAPS capsule Take 1 capsule (50,000 Units total) by mouth every 7 (seven) days. (Patient not taking: Reported on 08/01/2014) 12 capsule 0   No current facility-administered medications on file prior to visit.   History reviewed. No pertinent family history. History   Social History  . Marital Status: Divorced    Spouse Name: N/A  . Number of Children: N/A  . Years of Education: N/A   Occupational History  . Not on file.   Social History Main Topics  . Smoking status: Current Every Day  Smoker -- 1.00 packs/day    Types: Cigars  . Smokeless tobacco: Not on file  . Alcohol Use: Yes  . Drug Use: 7.00 per week    Special: Marijuana  . Sexual Activity: Not on file   Other Topics Concern  . Not on file   Social History Narrative    Review of Systems: See history of present illness   Objective:   Filed Vitals:   08/01/14 1523  BP: 114/69  Pulse: 65  Temp: 97.9 F (36.6 C)  Resp: 16    Physical Exam  Constitutional: He is oriented to person, place, and time.  Cardiovascular: Normal rate, regular rhythm and normal heart sounds.   Pulmonary/Chest: Effort normal and breath sounds normal.  Musculoskeletal: He exhibits no edema.  No tenderness at shoulder joint but pain with passive range of motion  Neurological: He is alert and oriented to person, place, and time.  Skin: Skin is warm and dry.     Lab Results  Component Value Date   WBC 4.8 05/15/2014   HGB 15.0 05/15/2014   HCT 43.7 05/15/2014   MCV 83.1 05/15/2014   PLT 262 05/15/2014   Lab Results  Component Value Date   CREATININE 0.83 05/15/2014   BUN 8 05/15/2014   NA 138 05/15/2014   K 4.0 05/15/2014   CL 102 05/15/2014   CO2 25 05/15/2014    No results found for: HGBA1C Lipid  Panel     Component Value Date/Time   CHOL 235* 05/15/2014 0904   TRIG 77 05/15/2014 0904   HDL 66 05/15/2014 0904   CHOLHDL 3.6 05/15/2014 0904   VLDL 15 05/15/2014 0904   LDLCALC 154* 05/15/2014 0904       Assessment and plan:   [name] was seen today for follow-up.  Diagnoses and all orders for this visit:  Right shoulder pain/Osteoarthritis of right knee, unspecified osteoarthritis type Orders: -     Refill ibuprofen (ADVIL,MOTRIN) 600 MG tablet; Take 1 tablet (600 mg total) by mouth every 8 (eight) hours as needed. -     AMB referral to orthopedics  HLD (hyperlipidemia) Orders: -    Refill atorvastatin (LIPITOR) 20 MG tablet; Take 1 tablet (20 mg total) by mouth daily at 6 PM.  May follow-up  as needed or if pain does not improve       Holland CommonsKECK, Sharrie Self, NP-C [facility] [phone] 08/01/2014, 3:36 PM

## 2014-08-01 NOTE — Progress Notes (Signed)
Pt is here following up on his chronic pain in his shoulders. Pt is needing his medications refilled.

## 2014-08-01 NOTE — Patient Instructions (Signed)
Fat and Cholesterol Control Diet Fat and cholesterol levels in your blood and organs are influenced by your diet. High levels of fat and cholesterol may lead to diseases of the heart, small and large blood vessels, gallbladder, liver, and pancreas. CONTROLLING FAT AND CHOLESTEROL WITH DIET Although exercise and lifestyle factors are important, your diet is key. That is because certain foods are known to raise cholesterol and others to lower it. The goal is to balance foods for their effect on cholesterol and more importantly, to replace saturated and trans fat with other types of fat, such as monounsaturated fat, polyunsaturated fat, and omega-3 fatty acids. On average, a person should consume no more than 15 to 17 g of saturated fat daily. Saturated and trans fats are considered "bad" fats, and they will raise LDL cholesterol. Saturated fats are primarily found in animal products such as meats, butter, and cream. However, that does not mean you need to give up all your favorite foods. Today, there are good tasting, low-fat, low-cholesterol substitutes for most of the things you like to eat. Choose low-fat or nonfat alternatives. Choose round or loin cuts of red meat. These types of cuts are lowest in fat and cholesterol. Chicken (without the skin), fish, veal, and ground turkey breast are great choices. Eliminate fatty meats, such as hot dogs and salami. Even shellfish have little or no saturated fat. Have a 3 oz (85 g) portion when you eat lean meat, poultry, or fish. Trans fats are also called "partially hydrogenated oils." They are oils that have been scientifically manipulated so that they are solid at room temperature resulting in a longer shelf life and improved taste and texture of foods in which they are added. Trans fats are found in stick margarine, some tub margarines, cookies, crackers, and baked goods.  When baking and cooking, oils are a great substitute for butter. The monounsaturated oils are  especially beneficial since it is believed they lower LDL and raise HDL. The oils you should avoid entirely are saturated tropical oils, such as coconut and palm.  Remember to eat a lot from food groups that are naturally free of saturated and trans fat, including fish, fruit, vegetables, beans, grains (barley, rice, couscous, bulgur wheat), and pasta (without cream sauces).  IDENTIFYING FOODS THAT LOWER FAT AND CHOLESTEROL  Soluble fiber may lower your cholesterol. This type of fiber is found in fruits such as apples, vegetables such as broccoli, potatoes, and carrots, legumes such as beans, peas, and lentils, and grains such as barley. Foods fortified with plant sterols (phytosterol) may also lower cholesterol. You should eat at least 2 g per day of these foods for a cholesterol lowering effect.  Read package labels to identify low-saturated fats, trans fat free, and low-fat foods at the supermarket. Select cheeses that have only 2 to 3 g saturated fat per ounce. Use a heart-healthy tub margarine that is free of trans fats or partially hydrogenated oil. When buying baked goods (cookies, crackers), avoid partially hydrogenated oils. Breads and muffins should be made from whole grains (whole-wheat or whole oat flour, instead of "flour" or "enriched flour"). Buy non-creamy canned soups with reduced salt and no added fats.  FOOD PREPARATION TECHNIQUES  Never deep-fry. If you must fry, either stir-fry, which uses very little fat, or use non-stick cooking sprays. When possible, broil, bake, or roast meats, and steam vegetables. Instead of putting butter or margarine on vegetables, use lemon and herbs, applesauce, and cinnamon (for squash and sweet potatoes). Use nonfat   yogurt, salsa, and low-fat dressings for salads.  LOW-SATURATED FAT / LOW-FAT FOOD SUBSTITUTES Meats / Saturated Fat (g)  Avoid: Steak, marbled (3 oz/85 g) / 11 g  Choose: Steak, lean (3 oz/85 g) / 4 g  Avoid: Hamburger (3 oz/85 g) / 7  g  Choose: Hamburger, lean (3 oz/85 g) / 5 g  Avoid: Ham (3 oz/85 g) / 6 g  Choose: Ham, lean cut (3 oz/85 g) / 2.4 g  Avoid: Chicken, with skin, dark meat (3 oz/85 g) / 4 g  Choose: Chicken, skin removed, dark meat (3 oz/85 g) / 2 g  Avoid: Chicken, with skin, light meat (3 oz/85 g) / 2.5 g  Choose: Chicken, skin removed, light meat (3 oz/85 g) / 1 g Dairy / Saturated Fat (g)  Avoid: Whole milk (1 cup) / 5 g  Choose: Low-fat milk, 2% (1 cup) / 3 g  Choose: Low-fat milk, 1% (1 cup) / 1.5 g  Choose: Skim milk (1 cup) / 0.3 g  Avoid: Hard cheese (1 oz/28 g) / 6 g  Choose: Skim milk cheese (1 oz/28 g) / 2 to 3 g  Avoid: Cottage cheese, 4% fat (1 cup) / 6.5 g  Choose: Low-fat cottage cheese, 1% fat (1 cup) / 1.5 g  Avoid: Ice cream (1 cup) / 9 g  Choose: Sherbet (1 cup) / 2.5 g  Choose: Nonfat frozen yogurt (1 cup) / 0.3 g  Choose: Frozen fruit bar / trace  Avoid: Whipped cream (1 tbs) / 3.5 g  Choose: Nondairy whipped topping (1 tbs) / 1 g Condiments / Saturated Fat (g)  Avoid: Mayonnaise (1 tbs) / 2 g  Choose: Low-fat mayonnaise (1 tbs) / 1 g  Avoid: Butter (1 tbs) / 7 g  Choose: Extra light margarine (1 tbs) / 1 g  Avoid: Coconut oil (1 tbs) / 11.8 g  Choose: Olive oil (1 tbs) / 1.8 g  Choose: Corn oil (1 tbs) / 1.7 g  Choose: Safflower oil (1 tbs) / 1.2 g  Choose: Sunflower oil (1 tbs) / 1.4 g  Choose: Soybean oil (1 tbs) / 2.4 g  Choose: Canola oil (1 tbs) / 1 g Document Released: 05/23/2005 Document Revised: 09/17/2012 Document Reviewed: 08/21/2013 [employer] Patient Information 2015 [employer]. This information is not intended to replace advice given to you by your health care provider. Make sure you discuss any questions you have with your health care provider.  

## 2015-10-08 ENCOUNTER — Emergency Department (HOSPITAL_COMMUNITY)
Admission: EM | Admit: 2015-10-08 | Discharge: 2015-10-08 | Disposition: A | Discharge status: Home / Self Care | Payer: No Typology Code available for payment source | Attending: Emergency Medicine | Admitting: Emergency Medicine

## 2015-10-08 ENCOUNTER — Emergency Department (HOSPITAL_COMMUNITY): Payer: No Typology Code available for payment source

## 2015-10-08 ENCOUNTER — Encounter (HOSPITAL_COMMUNITY): Payer: Self-pay | Admitting: Emergency Medicine

## 2015-10-08 DIAGNOSIS — Z79899 Other long term (current) drug therapy: Secondary | ICD-10-CM | POA: Diagnosis not present

## 2015-10-08 DIAGNOSIS — S4991XA Unspecified injury of right shoulder and upper arm, initial encounter: Secondary | ICD-10-CM | POA: Diagnosis present

## 2015-10-08 DIAGNOSIS — S8992XA Unspecified injury of left lower leg, initial encounter: Secondary | ICD-10-CM | POA: Diagnosis not present

## 2015-10-08 DIAGNOSIS — G8929 Other chronic pain: Secondary | ICD-10-CM | POA: Insufficient documentation

## 2015-10-08 DIAGNOSIS — Y92410 Unspecified street and highway as the place of occurrence of the external cause: Secondary | ICD-10-CM | POA: Diagnosis not present

## 2015-10-08 DIAGNOSIS — F17210 Nicotine dependence, cigarettes, uncomplicated: Secondary | ICD-10-CM | POA: Insufficient documentation

## 2015-10-08 DIAGNOSIS — Y9389 Activity, other specified: Secondary | ICD-10-CM | POA: Insufficient documentation

## 2015-10-08 DIAGNOSIS — Y998 Other external cause status: Secondary | ICD-10-CM | POA: Diagnosis not present

## 2015-10-08 DIAGNOSIS — Z9889 Other specified postprocedural states: Secondary | ICD-10-CM | POA: Diagnosis not present

## 2015-10-08 DIAGNOSIS — M25562 Pain in left knee: Secondary | ICD-10-CM

## 2015-10-08 DIAGNOSIS — M25511 Pain in right shoulder: Secondary | ICD-10-CM

## 2015-10-08 MED ORDER — METHOCARBAMOL 500 MG PO TABS: 500.0000 mg | ORAL_TABLET | Freq: Two times a day (BID) | ORAL | Status: DC

## 2015-10-08 MED ORDER — NAPROXEN 500 MG PO TABS: 500.0000 mg | ORAL_TABLET | Freq: Two times a day (BID) | ORAL | Status: DC
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
## 2015-10-08 NOTE — ED Notes (Signed)
Patient transported to X-ray 

## 2015-10-08 NOTE — ED Notes (Signed)
PT DISCHARGED. INSTRUCTIONS AND PRESCRIPTIONS GIVEN. AAOX3. PT IN NO APPARENT DISTRESS. THE OPPORTUNITY TO ASK QUESTIONS WAS PROVIDED. 
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
## 2017-11-20 ENCOUNTER — Emergency Department (HOSPITAL_COMMUNITY): Payer: Medicaid Other

## 2017-11-20 ENCOUNTER — Inpatient Hospital Stay (HOSPITAL_COMMUNITY)
Admission: EM | Admit: 2017-11-20 | Discharge: 2017-11-22 | DRG: 066 | Disposition: A | Discharge status: Home / Self Care | Payer: Medicaid Other | Attending: Internal Medicine | Admitting: Internal Medicine

## 2017-11-20 ENCOUNTER — Encounter (HOSPITAL_COMMUNITY): Payer: Self-pay

## 2017-11-20 ENCOUNTER — Inpatient Hospital Stay (HOSPITAL_COMMUNITY): Payer: Medicaid Other

## 2017-11-20 ENCOUNTER — Other Ambulatory Visit: Payer: Self-pay

## 2017-11-20 DIAGNOSIS — F17210 Nicotine dependence, cigarettes, uncomplicated: Secondary | ICD-10-CM | POA: Diagnosis present

## 2017-11-20 DIAGNOSIS — Z79899 Other long term (current) drug therapy: Secondary | ICD-10-CM

## 2017-11-20 DIAGNOSIS — F1910 Other psychoactive substance abuse, uncomplicated: Secondary | ICD-10-CM | POA: Diagnosis present

## 2017-11-20 DIAGNOSIS — I6389 Other cerebral infarction: Secondary | ICD-10-CM | POA: Diagnosis present

## 2017-11-20 DIAGNOSIS — I639 Cerebral infarction, unspecified: Secondary | ICD-10-CM | POA: Diagnosis not present

## 2017-11-20 DIAGNOSIS — Z7289 Other problems related to lifestyle: Secondary | ICD-10-CM

## 2017-11-20 DIAGNOSIS — R402142 Coma scale, eyes open, spontaneous, at arrival to emergency department: Secondary | ICD-10-CM | POA: Diagnosis present

## 2017-11-20 DIAGNOSIS — F1290 Cannabis use, unspecified, uncomplicated: Secondary | ICD-10-CM

## 2017-11-20 DIAGNOSIS — Z9114 Patient's other noncompliance with medication regimen: Secondary | ICD-10-CM | POA: Diagnosis not present

## 2017-11-20 DIAGNOSIS — IMO0001 Reserved for inherently not codable concepts without codable children: Secondary | ICD-10-CM

## 2017-11-20 DIAGNOSIS — I6521 Occlusion and stenosis of right carotid artery: Secondary | ICD-10-CM | POA: Diagnosis present

## 2017-11-20 DIAGNOSIS — R29701 NIHSS score 1: Secondary | ICD-10-CM | POA: Diagnosis present

## 2017-11-20 DIAGNOSIS — F17290 Nicotine dependence, other tobacco product, uncomplicated: Secondary | ICD-10-CM | POA: Diagnosis present

## 2017-11-20 DIAGNOSIS — E785 Hyperlipidemia, unspecified: Secondary | ICD-10-CM | POA: Diagnosis present

## 2017-11-20 DIAGNOSIS — M170 Bilateral primary osteoarthritis of knee: Secondary | ICD-10-CM | POA: Diagnosis present

## 2017-11-20 DIAGNOSIS — I10 Essential (primary) hypertension: Secondary | ICD-10-CM | POA: Diagnosis present

## 2017-11-20 DIAGNOSIS — R209 Unspecified disturbances of skin sensation: Secondary | ICD-10-CM | POA: Diagnosis not present

## 2017-11-20 DIAGNOSIS — R402252 Coma scale, best verbal response, oriented, at arrival to emergency department: Secondary | ICD-10-CM | POA: Diagnosis present

## 2017-11-20 DIAGNOSIS — R531 Weakness: Secondary | ICD-10-CM

## 2017-11-20 DIAGNOSIS — R402362 Coma scale, best motor response, obeys commands, at arrival to emergency department: Secondary | ICD-10-CM | POA: Diagnosis present

## 2017-11-20 HISTORY — DX: Unspecified osteoarthritis, unspecified site: M19.90

## 2017-11-20 HISTORY — DX: Other psychoactive substance abuse, uncomplicated: F19.10

## 2017-11-20 HISTORY — DX: Hyperlipidemia, unspecified: E78.5

## 2017-11-20 HISTORY — DX: Essential (primary) hypertension: I10

## 2017-11-20 LAB — COMPREHENSIVE METABOLIC PANEL
ALBUMIN: 4.1 g/dL (ref 3.5–5.0)
ALK PHOS: 75 U/L (ref 38–126)
ALT: 18 U/L (ref 17–63)
ANION GAP: 10 (ref 5–15)
AST: 24 U/L (ref 15–41)
BUN: <5 mg/dL — ABNORMAL LOW (ref 6–20)
CALCIUM: 9.3 mg/dL (ref 8.9–10.3)
CHLORIDE: 105 mmol/L (ref 101–111)
CO2: 25 mmol/L (ref 22–32)
Creatinine, Ser: 0.73 mg/dL (ref 0.61–1.24)
GFR calc Af Amer: >60 mL/min (ref >60)
GFR calc non Af Amer: >60 mL/min (ref >60)
GLUCOSE: 96 mg/dL (ref 65–99)
Potassium: 3.9 mmol/L (ref 3.5–5.1)
SODIUM: 140 mmol/L (ref 135–145)
Total Bilirubin: 0.6 mg/dL (ref 0.3–1.2)
Total Protein: 7.3 g/dL (ref 6.5–8.1)

## 2017-11-20 LAB — RAPID URINE DRUG SCREEN, HOSP PERFORMED
AMPHETAMINES: NOT DETECTED
BENZODIAZEPINES: NOT DETECTED
Cocaine: NOT DETECTED
OPIATES: NOT DETECTED
TETRAHYDROCANNABINOL: POSITIVE — AB

## 2017-11-20 LAB — DIFFERENTIAL
Abs Immature Granulocytes: 0 10*3/uL (ref 0.0–0.1)
BASOS ABS: 0.1 10*3/uL (ref 0.0–0.1)
BASOS PCT: 1 %
Eosinophils Absolute: 0.3 10*3/uL (ref 0.0–0.7)
Eosinophils Relative: 5 %
IMMATURE GRANULOCYTES: 0 %
LYMPHS PCT: 40 %
Lymphs Abs: 2.2 10*3/uL (ref 0.7–4.0)
Monocytes Absolute: 1 10*3/uL (ref 0.1–1.0)
Monocytes Relative: 19 %
NEUTROS ABS: 1.9 10*3/uL (ref 1.7–7.7)
NEUTROS PCT: 35 %

## 2017-11-20 LAB — CBC
HEMATOCRIT: 48.8 % (ref 39.0–52.0)
HEMOGLOBIN: 15.9 g/dL (ref 13.0–17.0)
MCH: 28.4 pg (ref 26.0–34.0)
MCHC: 32.6 g/dL (ref 30.0–36.0)
MCV: 87.3 fL (ref 78.0–100.0)
Platelets: 311 10*3/uL (ref 150–400)
RBC: 5.59 MIL/uL (ref 4.22–5.81)
RDW: 15.6 % — ABNORMAL HIGH (ref 11.5–15.5)
WBC: 5.4 10*3/uL (ref 4.0–10.5)

## 2017-11-20 LAB — I-STAT TROPONIN, ED: Troponin i, poc: 0.01 ng/mL (ref 0.00–0.08)

## 2017-11-20 LAB — PROTIME-INR
INR: 1
Prothrombin Time: 13.1 seconds (ref 11.4–15.2)

## 2017-11-20 LAB — ETHANOL: Alcohol, Ethyl (B): 52 mg/dL — ABNORMAL HIGH (ref <10)

## 2017-11-20 LAB — APTT: APTT: 29 s (ref 24–36)

## 2017-11-20 MED ORDER — LORAZEPAM 1 MG PO TABS
1.0000 mg | ORAL_TABLET | Freq: Four times a day (QID) | ORAL | Status: DC | PRN
Start: 2017-11-20 — End: 2017-11-22

## 2017-11-20 MED ORDER — ACETAMINOPHEN 650 MG RE SUPP: 650.0000 mg | RECTAL | Status: DC | PRN

## 2017-11-20 MED ORDER — FOLIC ACID 1 MG PO TABS
1.0000 mg | ORAL_TABLET | Freq: Every day | ORAL | Status: DC
  Administered 2017-11-21 – 2017-11-22 (×2): 1 mg via ORAL
  Filled 2017-11-20 (×2): qty 1

## 2017-11-20 MED ORDER — VITAMIN B-1 100 MG PO TABS
100.0000 mg | ORAL_TABLET | Freq: Every day | ORAL | Status: DC
  Administered 2017-11-21 – 2017-11-22 (×2): 100 mg via ORAL
  Filled 2017-11-20 (×2): qty 1

## 2017-11-20 MED ORDER — ENOXAPARIN SODIUM 40 MG/0.4ML ~~LOC~~ SOLN
40.0000 mg | SUBCUTANEOUS | Status: DC
  Administered 2017-11-21 – 2017-11-22 (×2): 40 mg via SUBCUTANEOUS
  Filled 2017-11-20 (×2): qty 0.4

## 2017-11-20 MED ORDER — SENNOSIDES-DOCUSATE SODIUM 8.6-50 MG PO TABS: 1.0000 | ORAL_TABLET | Freq: Every evening | ORAL | Status: DC | PRN

## 2017-11-20 MED ORDER — THIAMINE HCL 100 MG/ML IJ SOLN: 100.0000 mg | Freq: Every day | INTRAMUSCULAR | Status: DC

## 2017-11-20 MED ORDER — IOPAMIDOL (ISOVUE-370) INJECTION 76%
INTRAVENOUS | Status: AC
  Filled 2017-11-20: qty 50

## 2017-11-20 MED ORDER — STROKE: EARLY STAGES OF RECOVERY BOOK
Freq: Once | Status: AC
  Administered 2017-11-20: 1
  Filled 2017-11-20: qty 1

## 2017-11-20 MED ORDER — ASPIRIN 325 MG PO TABS
325.0000 mg | ORAL_TABLET | Freq: Every day | ORAL | Status: DC
  Administered 2017-11-20 – 2017-11-22 (×3): 325 mg via ORAL
  Filled 2017-11-20 (×3): qty 1

## 2017-11-20 MED ORDER — ADULT MULTIVITAMIN W/MINERALS CH
1.0000 | ORAL_TABLET | Freq: Every day | ORAL | Status: DC
  Administered 2017-11-21 – 2017-11-22 (×2): 1 via ORAL
  Filled 2017-11-20 (×2): qty 1

## 2017-11-20 MED ORDER — ACETAMINOPHEN 325 MG PO TABS
650.0000 mg | ORAL_TABLET | ORAL | Status: DC | PRN
  Administered 2017-11-21: 650 mg via ORAL
  Filled 2017-11-20: qty 2

## 2017-11-20 MED ORDER — LORAZEPAM 2 MG/ML IJ SOLN: 1.0000 mg | Freq: Four times a day (QID) | INTRAMUSCULAR | Status: DC | PRN

## 2017-11-20 MED ORDER — HYDRALAZINE HCL 20 MG/ML IJ SOLN
10.0000 mg | Freq: Four times a day (QID) | INTRAMUSCULAR | Status: DC | PRN
Start: 2017-11-20 — End: 2017-11-22

## 2017-11-20 MED ORDER — ATORVASTATIN CALCIUM 80 MG PO TABS
80.0000 mg | ORAL_TABLET | Freq: Every day | ORAL | Status: DC
  Administered 2017-11-20 – 2017-11-21 (×2): 80 mg via ORAL
  Filled 2017-11-20: qty 4
  Filled 2017-11-20: qty 1

## 2017-11-20 MED ORDER — ACETAMINOPHEN 160 MG/5ML PO SOLN: 650.0000 mg | ORAL | Status: DC | PRN

## 2017-11-20 MED ORDER — NICOTINE 21 MG/24HR TD PT24
21.0000 mg | MEDICATED_PATCH | Freq: Every day | TRANSDERMAL | Status: DC
  Administered 2017-11-21 – 2017-11-22 (×2): 21 mg via TRANSDERMAL
  Filled 2017-11-20 (×2): qty 1

## 2017-11-20 MED ORDER — ASPIRIN 325 MG PO TABS: 325.0000 mg | ORAL_TABLET | Freq: Once | ORAL | Status: DC

## 2017-11-20 MED ORDER — IOPAMIDOL (ISOVUE-370) INJECTION 76%
50.0000 mL | Freq: Once | INTRAVENOUS | Status: AC | PRN
  Administered 2017-11-20: 50 mL via INTRAVENOUS

## 2017-11-20 NOTE — ED Notes (Signed)
Pt transported to CT ?

## 2017-11-20 NOTE — ED Notes (Signed)
Pt informed to provide UA when possible. Pt verbalized understanding.   

## 2017-11-20 NOTE — ED Triage Notes (Signed)
Pt states he has had left side weakness for several months. He states he is unable to walk without a cane. Pt states he feels numbness and tingling in his extremities. Pt AOX4. Weaker on the left side.

## 2017-11-20 NOTE — ED Notes (Signed)
Attempted iv stick x2 unsuccessfully  

## 2017-11-20 NOTE — Consult Note (Signed)
NEURO HOSPITALIST      Requesting Physician: Dr. Madilyn Hook    Chief Complaint: left sided weakness  History obtained from:  Patient    HPI:                                                                                                                                         Nathan Hernandez is an 57 y.o. male  With PMH significant for HLD, presents to [facility] for 6 month history of gradual left side weakness.  Per patient this weekend his left side became weaker than normal on Saturday, but he refused to come to the hospital so they convinced him to come today. Over the past 6 months patient states that he has had numbness, and tingling on the left side, but none today. 6 months ago when he first noticed the weakness he states that it came on suddenly, but he thought it was related to his knee and did not think that it was a stroke. He denies any facial droop or slurring of words with this first episode. He walks with a cane at baseline due to left knee pain and bilateral OA of the knees.  Today he is having trouble gripping things with his left hand. Patient states that he has fallen in the past 6 months due to this weakness. Denies ever having any visual problems, n/v, trouble speaking or slurred speech. Patient  Smoked 1/2 pack of cigarettes per day for several years, but quit smoking cigarettes for the past 4-5 years, but does smoke 2-3 black and milds (Cigars)/ day, drinks 2-3 beers per day, and does smoke marijuana. Denies any other drug use. Patient also denies a history of HTN. He is not currently on any medications.   Last known normal:  6 months ago  Time: unknown  ED course:  CT Head showed no hemorrhage. 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 MRA of head shows  Abnormal distal cervical and proximal  intracranial portions of right ICA with mild diffuse narrowing.   Last BP was: 144/98  NIHSS: 1 Rankin: 1    History reviewed. No pertinent past medical history.  Past Surgical History:  Procedure Laterality Date  . KNEE SURGERY      Family History  Problem Relation Age of Onset  . Diabetes Mother   . Hypertension Mother   . Hypertension Father   . Diabetes Father      Social History:  reports that he has been smoking cigars.  He has been smoking  about 1.00 pack per day. He has never used smokeless tobacco. He reports that he drinks alcohol. He reports that he has current or past drug history. Drug: Marijuana. Frequency: 7.00 times per week.  Allergies: No Known Allergies  Medications:                                                                                                                           No current facility-administered medications for this encounter.    Current Outpatient Medications  Medication Sig Dispense Refill  . ibuprofen (ADVIL,MOTRIN) 200 MG tablet Take 800 mg by mouth every 6 (six) hours as needed for moderate pain.    Marland Kitchen atorvastatin (LIPITOR) 20 MG tablet Take 1 tablet (20 mg total) by mouth daily at 6 PM. (Patient not taking: Reported on 11/20/2017) 30 tablet 4  . ibuprofen (ADVIL,MOTRIN) 600 MG tablet Take 1 tablet (600 mg total) by mouth every 8 (eight) hours as needed. (Patient not taking: Reported on 11/20/2017) 60 tablet 4  . methocarbamol (ROBAXIN) 500 MG tablet Take 1 tablet (500 mg total) by mouth 2 (two) times daily. (Patient not taking: Reported on 11/20/2017) 20 tablet 0  . naproxen (NAPROSYN) 500 MG tablet Take 1 tablet (500 mg total) by mouth 2 (two) times daily with a meal. (Patient not taking: Reported on 11/20/2017) 30 tablet 0  . traMADol (ULTRAM) 50 MG tablet Take 1 tablet (50 mg total) by mouth every 8 (eight) hours as needed. (Patient not taking: Reported on 08/01/2014) 30 tablet 0     ROS:                                                                                                                                        History obtained from chart review  General ROS: negative for - chills, fatigue, fever, night sweats, weight gain or weight loss Ophthalmic ROS: negative for - blurry vision, double vision, eye pain or loss of vision ENT ROS: negative for - epistaxis, nasal discharge, oral lesions, sore throat, tinnitus or vertigo, problems swallowing Respiratory ROS: negative for - cough,  shortness of breath or wheezing Cardiovascular ROS: negative for - chest pain, dyspnea on exertion,  Gastrointestinal ROS: negative for - abdominal pain,nausea/vomitin Musculoskeletal ROS: positive for - left side muscular weakness Neurological ROS: as noted in HPI   General Examination:  Blood pressure (!) 144/98, pulse 70, temperature 98.7 F (37.1 C), temperature source Oral, resp. rate 14, SpO2 100 %.  HEENT-  Normocephalic, no lesions, without obvious abnormality.  Normal external eye and conjunctiva. Bilateral halos present around iris. Cardiovascular- S1-S2 audible, pulses palpable throughout   Lungs-no rhonchi or wheezing noted, no excessive working breathing.  Saturations within normal limits on RA Extremities- Warm, dry and intact Musculoskeletal-left arm and leg weakness, left knee pain ( braced) Skin-warm and dry, intact  Neurological Examination Mental Status: Alert, oriented, to person, place, time, situation.  No dysarthria or aphasia.  Able to follow commands with no difficulty. Cranial Nerves: II:  Visual fields grossly normal,  III,IV, VI: ptosis not present, extra-ocular motions intact bilaterally, pupils equal, round, reactive to light and accommodation  V,VII: smile symmetric, facial light touch/temperature sensation normal bilaterally VIII: hearing normal bilaterally IX,X: uvula rises symmetrically XI:  bilateral shoulder shrug XII: midline tongue extension Motor: Right : Upper extremity   5/5  Left:     Upper extremity   5/5  Lower extremity   5/5  Lower extremity   5/5 With left side being weaker than the right side. Left arm pronator drift noted. Tone and bulk left side has increased tone throughout, no atrophy noted Sensory:light touch/ temperature intact throughout, bilaterally Deep Tendon Reflexes: right leg 2+, left leg 4+ with clonus present Left arm  Plantars: Right: downgoing   Left: downgoing Cerebellar:  left FNF is slower than right side, but no ataxia noted. normal heel-to-shin test, but left side restricted by pain. Gait: did not test   Lab Results: Basic Metabolic Panel: Recent Labs  Lab 11/20/17 1143  NA 140  K 3.9  CL 105  CO2 25  GLUCOSE 96  BUN <5*  CREATININE 0.73  CALCIUM 9.3    CBC: Recent Labs  Lab 11/20/17 1143  WBC 5.4  NEUTROABS 1.9  HGB 15.9  HCT 48.8  MCV 87.3  PLT 311    Lipid Panel: No results for input(s): CHOL, TRIG, HDL, CHOLHDL, VLDL, LDLCALC in the last 168 hours.  CBG: No results for input(s): GLUCAP in the last 168 hours.  Imaging: Ct Head Wo Contrast  Result Date: 11/20/2017 CLINICAL DATA:  Left-sided weakness the past few months. Numbness and tingling of the extremities. EXAM: CT HEAD WITHOUT CONTRAST TECHNIQUE: Contiguous axial images were obtained from the base of the skull through the vertex without intravenous contrast. COMPARISON:  CT head report dated January 26, 1999. FINDINGS: Brain: Focal loss of the normal gray-white matter differentiation in the right pre and postcentral gyri. No evidence of acute hemorrhage, hydrocephalus, extra-axial collection or mass lesion/mass effect. Old lacunar infarcts in the bilateral basal ganglia and right pons. Vascular: Atherosclerotic vascular calcification of the carotid siphons. No hyperdense vessel. Skull: Normal. Negative for fracture or focal lesion. Sinuses/Orbits: No acute  finding. Other: None. IMPRESSION: 1. Loss of the normal gray-white matter differentiation in the right pre and postcentral gyri, suspicious for subacute infarct given clinical history. 2. Chronic appearing lacunar infarcts in the bilateral basal ganglia and right pons. Electronically Signed   By: Obie Dredge M.D.   On: 11/20/2017 12:41   Mr Maxine Glenn Head Wo Contrast  Result Date: 11/20/2017 CLINICAL DATA:  Left-sided weakness for several months. Numbness and tingling in the extremities. EXAM: MRI HEAD WITHOUT CONTRAST MRA HEAD WITHOUT CONTRAST MRI CERVICAL SPINE WITHOUT CONTRAST TECHNIQUE: Multiplanar, multiecho pulse sequences of the brain and surrounding structures, and cervical spine, to include the craniocervical junction  and cervicothoracic junction, were obtained without intravenous contrast. Angiographic images of the head were obtained using MRA technique without contrast. COMPARISON:  Head CT 11/20/2017 FINDINGS: MRI HEAD FINDINGS Brain: Small scattered acute to early subacute cortical and white matter infarcts are present in the right cerebral hemisphere which appear to largely be along superficial and deep watershed zones. The largest confluent area of infarction involves 2 cm of right frontoparietal cortex at the vertex, corresponding to the abnormality described on CT. Multiple sagittally oriented infarcts are present in the right centrum semiovale. Small right parieto-occipital cortical infarcts are noted in the posterior MCA/watershed territory. There are a couple of punctate acute right temporal lobe cortical infarcts, and there is a small acute infarct involving the right basal ganglia and anterior limb of internal capsule. No acute left cerebral hemispheric or posterior fossa infarcts are identified. Chronic microhemorrhages are noted in the posterior left frontal lobe at the level of the corona radiata and in the right cerebellum. There are small cortical infarcts in the posterior right  temporal lobe and lateral right occipital lobe as well as small white matter infarcts in the right centrum semiovale which have a more late subacute to chronic appearance. Chronic lacunar infarcts are also noted in the basal ganglia bilaterally and in the right pons. The ventricles are normal in size. No mass, midline shift, or extra-axial fluid collection is present. Vascular: Abnormal appearance of the distal right internal carotid artery with some is centric. Skull and upper cervical spine: Unremarkable bone marrow signal. Sinuses/Orbits: Unremarkable orbits. Minimal mucosal thickening in the frontal, ethmoid, and right sphenoid sinuses. Clear mastoid air cells. Other: None. MRA HEAD FINDINGS The visualized distal vertebral arteries are patent to the basilar and codominant. There is mild left V4 stenosis at the vertebrobasilar junction. Patent PICA, AICA, and SCA origins are visualized bilaterally. The basilar artery is patent with motion artifact through its proximal portion and no evidence of significant stenosis. There is a predominantly fetal type origin of the right PCA, possibly with a diminutive and diseased right P1 segment. The large right posterior communicating artery is widely patent, as is the left PCA. The included distal cervical and intracranial segments of both internal carotid arteries are patent to the carotid termini. However, the right ICA is diffusely small in caliber compared to the left. There is abnormal signal diffusely surrounding the right ICA as it enters the skull base as well as throughout the petrous portion. Flow related enhancement is mildly diminished throughout the distal right petrous ICA extending to the petrous-cavernous junction, and there is superimposed mild additional narrowing throughout this region. The ACAs and MCAs are patent without evidence of proximal branch occlusion or significant proximal stenosis, although the right ACA and right MCA appears slightly  attenuated with less robust flow diffusely compared to the contralateral side. No aneurysm is identified. MRI CERVICAL SPINE FINDINGS The study is mildly motion degraded. Alignment: Cervical spine straightening.  No significant listhesis. Vertebrae: No fracture, suspicious osseous lesion, or significant marrow edema. Type 2 degenerative endplate changes from C5-T1. Cord: Normal signal and morphology. Posterior Fossa, vertebral arteries, paraspinal tissues: Preserved vertebral artery flow voids. Abnormal appearance of the proximal right ICA flow void. Unremarkable paraspinal soft tissues. Disc levels: C2-3: Asymmetric right facet arthrosis with likely ankylosis across the joint. Mild uncovertebral spurring. Mild right neural foraminal stenosis. No spinal stenosis. C3-4: Moderate disc space narrowing. Disc bulging, uncovertebral spurring, and mild facet arthrosis result in mild spinal stenosis and moderate to severe bilateral neural  foraminal stenosis. C4-5: Mild disc bulging, mild uncovertebral spurring, and mild facet arthrosis result in mild-to-moderate right and moderate left neural foraminal stenosis and borderline spinal stenosis. C5-6: Right greater than left uncovertebral hypertrophy results in severe right and moderate to severe left neural foraminal stenosis. Minimal disc bulging does not result in significant spinal stenosis. C6-7: Uncovertebral spurring results in moderate to severe bilateral neural foraminal stenosis. No spinal stenosis. C7-T1: Uncovertebral spurring and mild disc bulging result in moderate to severe bilateral neural foraminal stenosis without spinal stenosis. T1-2: Bilateral foraminal disc protrusions result in moderate bilateral neural foraminal stenosis without spinal stenosis. IMPRESSION: 1. 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 2. 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 3. Diffusely abnormal appearance of the distal cervical and proximal intracranial portions of the right ICA including mild diffuse narrowing. Head and neck CTA is recommended to further evaluate for a possible dissection or flow limiting proximal stenosis. 4. Diffuse cervical spondylosis with moderate to severe multilevel neural foraminal stenosis as above. 5. Mild spinal stenosis at C3-4. Electronically Signed   By: Sebastian Ache M.D.   On: 11/20/2017 16:21   Mr Brain Wo Contrast  Result Date: 11/20/2017 CLINICAL DATA:  Left-sided weakness for several months. Numbness and tingling in the extremities. EXAM: MRI HEAD WITHOUT CONTRAST MRA HEAD WITHOUT CONTRAST MRI CERVICAL SPINE WITHOUT CONTRAST TECHNIQUE: Multiplanar, multiecho pulse sequences of the brain and surrounding structures, and cervical spine, to include the craniocervical junction and cervicothoracic junction, were obtained without intravenous contrast. Angiographic images of the head were obtained using MRA technique without contrast. COMPARISON:  Head CT 11/20/2017 FINDINGS: MRI HEAD FINDINGS Brain: Small scattered acute to early subacute cortical and white matter infarcts are present in the right cerebral hemisphere which appear to largely be along superficial and deep watershed zones. The largest confluent area of infarction involves 2 cm of right frontoparietal cortex at the vertex, corresponding to the abnormality described on CT. Multiple sagittally oriented infarcts are present in the right centrum semiovale. Small right parieto-occipital cortical infarcts are noted in the posterior MCA/watershed territory. There are a couple of punctate acute right temporal lobe cortical infarcts, and there is a small acute infarct involving the right basal ganglia and anterior limb of internal capsule. No acute left cerebral hemispheric or posterior fossa infarcts are identified. Chronic microhemorrhages are noted in the posterior  left frontal lobe at the level of the corona radiata and in the right cerebellum. There are small cortical infarcts in the posterior right temporal lobe and lateral right occipital lobe as well as small white matter infarcts in the right centrum semiovale which have a more late subacute to chronic appearance. Chronic lacunar infarcts are also noted in the basal ganglia bilaterally and in the right pons. The ventricles are normal in size. No mass, midline shift, or extra-axial fluid collection is present. Vascular: Abnormal appearance of the distal right internal carotid artery with some is centric. Skull and upper cervical spine: Unremarkable bone marrow signal. Sinuses/Orbits: Unremarkable orbits. Minimal mucosal thickening in the frontal, ethmoid, and right sphenoid sinuses. Clear mastoid air cells. Other: None. MRA HEAD FINDINGS The visualized distal vertebral arteries are patent to the basilar and codominant. There is mild left V4 stenosis at the vertebrobasilar junction. Patent PICA, AICA, and SCA origins are visualized bilaterally. The basilar artery is patent with motion artifact through its proximal portion and no evidence of significant  stenosis. There is a predominantly fetal type origin of the right PCA, possibly with a diminutive and diseased right P1 segment. The large right posterior communicating artery is widely patent, as is the left PCA. The included distal cervical and intracranial segments of both internal carotid arteries are patent to the carotid termini. However, the right ICA is diffusely small in caliber compared to the left. There is abnormal signal diffusely surrounding the right ICA as it enters the skull base as well as throughout the petrous portion. Flow related enhancement is mildly diminished throughout the distal right petrous ICA extending to the petrous-cavernous junction, and there is superimposed mild additional narrowing throughout this region. The ACAs and MCAs are patent  without evidence of proximal branch occlusion or significant proximal stenosis, although the right ACA and right MCA appears slightly attenuated with less robust flow diffusely compared to the contralateral side. No aneurysm is identified. MRI CERVICAL SPINE FINDINGS The study is mildly motion degraded. Alignment: Cervical spine straightening.  No significant listhesis. Vertebrae: No fracture, suspicious osseous lesion, or significant marrow edema. Type 2 degenerative endplate changes from C5-T1. Cord: Normal signal and morphology. Posterior Fossa, vertebral arteries, paraspinal tissues: Preserved vertebral artery flow voids. Abnormal appearance of the proximal right ICA flow void. Unremarkable paraspinal soft tissues. Disc levels: C2-3: Asymmetric right facet arthrosis with likely ankylosis across the joint. Mild uncovertebral spurring. Mild right neural foraminal stenosis. No spinal stenosis. C3-4: Moderate disc space narrowing. Disc bulging, uncovertebral spurring, and mild facet arthrosis result in mild spinal stenosis and moderate to severe bilateral neural foraminal stenosis. C4-5: Mild disc bulging, mild uncovertebral spurring, and mild facet arthrosis result in mild-to-moderate right and moderate left neural foraminal stenosis and borderline spinal stenosis. C5-6: Right greater than left uncovertebral hypertrophy results in severe right and moderate to severe left neural foraminal stenosis. Minimal disc bulging does not result in significant spinal stenosis. C6-7: Uncovertebral spurring results in moderate to severe bilateral neural foraminal stenosis. No spinal stenosis. C7-T1: Uncovertebral spurring and mild disc bulging result in moderate to severe bilateral neural foraminal stenosis without spinal stenosis. T1-2: Bilateral foraminal disc protrusions result in moderate bilateral neural foraminal stenosis without spinal stenosis. IMPRESSION: 1. Multiple small infarcts throughout the right cerebral  hemisphere primarily in a watershed distribution and varying in age from acute to late subacute/chronic. 2. 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 3. Diffusely abnormal appearance of the distal cervical and proximal intracranial portions of the right ICA including mild diffuse narrowing. Head and neck CTA is recommended to further evaluate for a possible dissection or flow limiting proximal stenosis. 4. Diffuse cervical spondylosis with moderate to severe multilevel neural foraminal stenosis as above. 5. Mild spinal stenosis at C3-4. Electronically Signed   By: Sebastian Ache M.D.   On: 11/20/2017 16:21   Mr Cervical Spine Wo Contrast  Result Date: 11/20/2017 CLINICAL DATA:  Left-sided weakness for several months. Numbness and tingling in the extremities. EXAM: MRI HEAD WITHOUT CONTRAST MRA HEAD WITHOUT CONTRAST MRI CERVICAL SPINE WITHOUT CONTRAST TECHNIQUE: Multiplanar, multiecho pulse sequences of the brain and surrounding structures, and cervical spine, to include the craniocervical junction and cervicothoracic junction, were obtained without intravenous contrast. Angiographic images of the head were obtained using MRA technique without contrast. COMPARISON:  Head CT 11/20/2017 FINDINGS: MRI HEAD FINDINGS Brain: Small scattered acute to early subacute cortical and white matter infarcts are present in the right cerebral hemisphere which appear to largely be along superficial and deep watershed zones. The largest confluent area  of infarction involves 2 cm of right frontoparietal cortex at the vertex, corresponding to the abnormality described on CT. Multiple sagittally oriented infarcts are present in the right centrum semiovale. Small right parieto-occipital cortical infarcts are noted in the posterior MCA/watershed territory. There are a couple of punctate acute right temporal lobe cortical infarcts, and there is a small acute infarct involving the right basal ganglia and anterior limb of  internal capsule. No acute left cerebral hemispheric or posterior fossa infarcts are identified. Chronic microhemorrhages are noted in the posterior left frontal lobe at the level of the corona radiata and in the right cerebellum. There are small cortical infarcts in the posterior right temporal lobe and lateral right occipital lobe as well as small white matter infarcts in the right centrum semiovale which have a more late subacute to chronic appearance. Chronic lacunar infarcts are also noted in the basal ganglia bilaterally and in the right pons. The ventricles are normal in size. No mass, midline shift, or extra-axial fluid collection is present. Vascular: Abnormal appearance of the distal right internal carotid artery with some is centric. Skull and upper cervical spine: Unremarkable bone marrow signal. Sinuses/Orbits: Unremarkable orbits. Minimal mucosal thickening in the frontal, ethmoid, and right sphenoid sinuses. Clear mastoid air cells. Other: None. MRA HEAD FINDINGS The visualized distal vertebral arteries are patent to the basilar and codominant. There is mild left V4 stenosis at the vertebrobasilar junction. Patent PICA, AICA, and SCA origins are visualized bilaterally. The basilar artery is patent with motion artifact through its proximal portion and no evidence of significant stenosis. There is a predominantly fetal type origin of the right PCA, possibly with a diminutive and diseased right P1 segment. The large right posterior communicating artery is widely patent, as is the left PCA. The included distal cervical and intracranial segments of both internal carotid arteries are patent to the carotid termini. However, the right ICA is diffusely small in caliber compared to the left. There is abnormal signal diffusely surrounding the right ICA as it enters the skull base as well as throughout the petrous portion. Flow related enhancement is mildly diminished throughout the distal right petrous ICA  extending to the petrous-cavernous junction, and there is superimposed mild additional narrowing throughout this region. The ACAs and MCAs are patent without evidence of proximal branch occlusion or significant proximal stenosis, although the right ACA and right MCA appears slightly attenuated with less robust flow diffusely compared to the contralateral side. No aneurysm is identified. MRI CERVICAL SPINE FINDINGS The study is mildly motion degraded. Alignment: Cervical spine straightening.  No significant listhesis. Vertebrae: No fracture, suspicious osseous lesion, or significant marrow edema. Type 2 degenerative endplate changes from C5-T1. Cord: Normal signal and morphology. Posterior Fossa, vertebral arteries, paraspinal tissues: Preserved vertebral artery flow voids. Abnormal appearance of the proximal right ICA flow void. Unremarkable paraspinal soft tissues. Disc levels: C2-3: Asymmetric right facet arthrosis with likely ankylosis across the joint. Mild uncovertebral spurring. Mild right neural foraminal stenosis. No spinal stenosis. C3-4: Moderate disc space narrowing. Disc bulging, uncovertebral spurring, and mild facet arthrosis result in mild spinal stenosis and moderate to severe bilateral neural foraminal stenosis. C4-5: Mild disc bulging, mild uncovertebral spurring, and mild facet arthrosis result in mild-to-moderate right and moderate left neural foraminal stenosis and borderline spinal stenosis. C5-6: Right greater than left uncovertebral hypertrophy results in severe right and moderate to severe left neural foraminal stenosis. Minimal disc bulging does not result in significant spinal stenosis. C6-7: Uncovertebral spurring results in moderate to  severe bilateral neural foraminal stenosis. No spinal stenosis. C7-T1: Uncovertebral spurring and mild disc bulging result in moderate to severe bilateral neural foraminal stenosis without spinal stenosis. T1-2: Bilateral foraminal disc protrusions result  in moderate bilateral neural foraminal stenosis without spinal stenosis. IMPRESSION: 1. 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 2. 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 3. Diffusely abnormal appearance of the distal cervical and proximal intracranial portions of the right ICA including mild diffuse narrowing. Head and neck CTA is recommended to further evaluate for a possible dissection or flow limiting proximal stenosis. 4. Diffuse cervical spondylosis with moderate to severe multilevel neural foraminal stenosis as above. 5. Mild spinal stenosis at C3-4. Electronically Signed   By: Sebastian Ache M.D.   On: 11/20/2017 16:21    Assessment and plan discussed with with attending physician and they are in agreement.    Valentina Lucks, MSN, NP-C [facility] [phone]  11/20/2017, 4:15 PM    NEUROHOSPITALIST ADDENDUM Seen and examined the patient today. I have reviewed the contents of history and physical exam as documented by PA/ARNP/Resident and agree with above documentation.  I have discussed and formulated the above plan as documented. Edits to the note have been made as needed.   Assessment: 57 year old AA male with PMH, HLD, and smoking presents to ED for 6 month history of increasing left side weakness. CT head showed no hemorrhage, MRI showed  Multiple subacute and chronic  Infarcts in watershed territory.   Suspect the patient likely had an acute stroke 6 months ago and perhaps has had more strokes in the same territory resulting in progressive left-sided weakness. MRA  head shows narrowing of the right ICA.  CTA neck is pending.    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 Dual platelets may interfere with performing CEA if needed) -- atorvastatin 40mg  -- HgbA1c, fasting lipid panel -- PT consult, OT consult, Speech consult --Telemetry monitoring --Frequent neuro checks --Stroke swallow screen  --please page stroke NP  Or  PA  Or MD from 8am -4 pm  as this patient from this time will be  followed by the stroke.   You can look them up on [email or web address]  Password [facility]     Georgiana Spinner Stela Iwasaki MD [facility] [phone]

## 2017-11-20 NOTE — Progress Notes (Signed)
Patient arrived from ED around 2000 alert and oriented with left upper and lower extremity weakness as well as some loss of sensation, also some left knee pain he has family stopping in to see him. BP was high on initial assessment has permissive hypertension protocol in place.  Will continue to monitor.

## 2017-11-20 NOTE — ED Notes (Signed)
Attempted report 

## 2017-11-20 NOTE — H&P (Signed)
History and Physical    Nathan Hernandez ZOX:[id number] DOB: [birth year] DOA: 11/20/2017  **Will admit patient based on the expectation that the patient will need hospitalization/ hospital care that crosses at least 2 midnights  PCP: Patient, No Pcp Per   Attending physician: Catha Gosselin  Patient coming from/Resides with: Private residence  Chief Complaint: Acute on chronic left-sided weakness and numbness  HPI: Nathan Hernandez is a 57 y.o. male with medical history significant for dyslipidemia, ?  Hypertension, tobacco abuse, daily alcohol and marijuana use.  Patient also has a history of osteoarthritis and has chronic left knee pain.  Patient reports has had left-sided weakness primarily involving the LUE for several months this is been associated with numbness and tingling in the LUE and as well as the left foot.  He has to mobilize with a cane.  Over the past several days he has noticed increased numbness tingling and difficulty moving the LUE and difficulty with gait and balance.  CT of the head revealed chronic appearing lacunar infarcts in the bilateral basal ganglia and right pons.  MRI/MRA brain revealed multiple small infarcts throughout the right cerebral hemisphere primarily in a watershed distribution and varying in age from acute to late subacute/chronic.  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  There was also diffusely abnormal appearance of the distal cervical and proximal intracranial portions of the right ICA including mild diffuse narrowing.  Head and neck CTA recommended to better evaluate for possible dissection or flow-limiting proximal stenosis of the right ICA.  Patient has been evaluated by the neuro hospitalist team and will be admitted for acute on chronic stroke.  ED Course:  Vital Signs: BP 123/66   Pulse 80   Temp 98.2 F (36.8 C)   Resp 18   SpO2 97%  CT head, MRI/MRA brain: As above Lab data: Sodium 140, potassium  3.9, chloride 105, CO2 25, glucose 96, BUN less than 5, creatinine 0.73, anion gap 10, LFTs normal, troponin normal, white count 5400 with normal differential, hemoglobin 15.9, platelets 311,000, coags normal Medications and treatments: Aspirin 325 mg x 1  Review of Systems:  In addition to the HPI above,  No Fever-chills, myalgias or other constitutional symptoms No Headache, changes with Vision or hearing, dizziness, dysarthria or word finding difficulty, tremors or seizure activity No problems swallowing food or Liquids, indigestion/reflux, choking or coughing while eating, abdominal pain with or after eating No Chest pain, Cough or Shortness of Breath, palpitations, orthopnea or DOE No Abdominal pain, N/V, melena,hematochezia, dark tarry stools, constipation No dysuria, malodorous urine, hematuria or flank pain No new skin rashes, lesions, masses or bruises, No new joint pains, aches, swelling or redness No recent unintentional weight gain or loss No polyuria, polydypsia or polyphagia   Past Medical History:  Diagnosis Date  . HLD (hyperlipidemia)   . Hypertension   . Osteoarthritis   . Polysubstance abuse [facility])     Past Surgical History:  Procedure Laterality Date  . KNEE SURGERY      Social History   Socioeconomic History  . Marital status: Divorced    Spouse name: Not on file  . Number of children: Not on file  . Years of education: Not on file  . Highest education level: Not on file  Occupational History  . Not on file  Social Needs  . Financial resource strain: Not on file  . Food insecurity:    Worry: Not on file  Inability: Not on file  . Transportation needs:    Medical: Not on file    Non-medical: Not on file  Tobacco Use  . Smoking status: Current Every Day Smoker    Packs/day: 1.00    Types: Cigars  . Smokeless tobacco: Never Used  Substance and Sexual Activity  . Alcohol use: Yes  . Drug use: Yes    Frequency: 7.0 times per week    Types:  Marijuana  . Sexual activity: Not on file  Lifestyle  . Physical activity:    Days per week: Not on file    Minutes per session: Not on file  . Stress: Not on file  Relationships  . Social connections:    Talks on phone: Not on file    Gets together: Not on file    Attends religious service: Not on file    Active member of club or organization: Not on file    Attends meetings of clubs or organizations: Not on file    Relationship status: Not on file  . Intimate partner violence:    Fear of current or ex partner: Not on file    Emotionally abused: Not on file    Physically abused: Not on file    Forced sexual activity: Not on file  Other Topics Concern  . Not on file  Social History Narrative  . Not on file    Mobility: Utilizes a walking stick type cane Work history: Not obtained   No Known Allergies  Family History  Problem Relation Age of Onset  . Diabetes Mother   . Hypertension Mother   . Hypertension Father   . Diabetes Father       Prior to Admission medications   Medication Sig Start Date End Date Taking? Authorizing Provider  ibuprofen (ADVIL,MOTRIN) 200 MG tablet Take 800 mg by mouth every 6 (six) hours as needed for moderate pain.   Yes [provider]  atorvastatin (LIPITOR) 20 MG tablet Take 1 tablet (20 mg total) by mouth daily at 6 PM. Patient not taking: Reported on 11/20/2017 08/01/14   Ambrose FinlandKeck, Valerie A, NP  ibuprofen (ADVIL,MOTRIN) 600 MG tablet Take 1 tablet (600 mg total) by mouth every 8 (eight) hours as needed. Patient not taking: Reported on 11/20/2017 08/01/14   Ambrose FinlandKeck, Valerie A, NP  methocarbamol (ROBAXIN) 500 MG tablet Take 1 tablet (500 mg total) by mouth 2 (two) times daily. Patient not taking: Reported on 11/20/2017 10/08/15   Garlon HatchetSanders, Lisa M, PA-C  naproxen (NAPROSYN) 500 MG tablet Take 1 tablet (500 mg total) by mouth 2 (two) times daily with a meal. Patient not taking: Reported on 11/20/2017 10/08/15   Garlon HatchetSanders, Lisa M, PA-C  traMADol  (ULTRAM) 50 MG tablet Take 1 tablet (50 mg total) by mouth every 8 (eight) hours as needed. Patient not taking: Reported on 08/01/2014 05/12/14   Ambrose FinlandKeck, Valerie A, NP    Physical Exam: Vitals:   11/20/17 1645 11/20/17 1658 11/20/17 1715 11/20/17 1801  BP: (!) 156/80  123/66   Pulse: 64   80  Resp: 17  18   Temp:  98.2 F (36.8 C)    TempSrc:      SpO2: 97%         Constitutional: NAD, calm, comfortable Eyes: PERRL, lids and conjunctivae normal ENMT: Mucous membranes are moist. Posterior pharynx clear of any exudate or lesions. Neck: normal, supple, no masses, no thyromegaly Respiratory: clear to auscultation bilaterally, no wheezing, no crackles. Normal respiratory effort. No  accessory muscle use.  Cardiovascular: Regular rate and rhythm, no murmurs / rubs / gallops. No extremity edema. 2+ pedal pulses. No carotid bruits.  Abdomen: no tenderness, no masses palpated. No hepatosplenomegaly. Bowel sounds positive.  Musculoskeletal: no clubbing / cyanosis. No joint deformity upper and lower extremities. Good ROM, no contractures. Normal muscle tone.  Skin: no rashes, lesions, ulcers. No induration Neurologic: CN 2-12 grossly intact. Sensation intact, DTR normal. Strength 5/5 on right side; LUE strength both flexion and extension 3/5.  Patient had equal bilateral leg lift strength but had notable decreased extension/flexion strength of left foot at 3/5 Psychiatric: Normal judgment and insight. Alert and oriented x 3. Normal mood.    Labs on Admission: I have personally reviewed following labs and imaging studies  CBC: Recent Labs  Lab 11/20/17 1143  WBC 5.4  NEUTROABS 1.9  HGB 15.9  HCT 48.8  MCV 87.3  PLT 311   Basic Metabolic Panel: Recent Labs  Lab 11/20/17 1143  NA 140  K 3.9  CL 105  CO2 25  GLUCOSE 96  BUN <5*  CREATININE 0.73  CALCIUM 9.3   GFR: CrCl cannot be calculated (Unknown ideal weight.). Liver Function Tests: Recent Labs  Lab 11/20/17 1143  AST 24   ALT 18  ALKPHOS 75  BILITOT 0.6  PROT 7.3  ALBUMIN 4.1   No results for input(s): LIPASE, AMYLASE in the last 168 hours. No results for input(s): AMMONIA in the last 168 hours. Coagulation Profile: Recent Labs  Lab 11/20/17 1143  INR 1.00   Cardiac Enzymes: No results for input(s): CKTOTAL, CKMB, CKMBINDEX, TROPONINI in the last 168 hours. BNP (last 3 results) No results for input(s): PROBNP in the last 8760 hours. HbA1C: No results for input(s): HGBA1C in the last 72 hours. CBG: No results for input(s): GLUCAP in the last 168 hours. Lipid Profile: No results for input(s): CHOL, HDL, LDLCALC, TRIG, CHOLHDL, LDLDIRECT in the last 72 hours. Thyroid Function Tests: No results for input(s): TSH, T4TOTAL, FREET4, T3FREE, THYROIDAB in the last 72 hours. Anemia Panel: No results for input(s): VITAMINB12, FOLATE, FERRITIN, TIBC, IRON, RETICCTPCT in the last 72 hours. Urine analysis:    Component Value Date/Time   COLORURINE YELLOW 04/05/2013 1456   APPEARANCEUR CLEAR 04/05/2013 1456   LABSPEC 1.005 04/05/2013 1456   PHURINE 6.5 04/05/2013 1456   GLUCOSEU NEG 04/05/2013 1456   HGBUR NEG 04/05/2013 1456   BILIRUBINUR NEG 04/05/2013 1456   KETONESUR NEG 04/05/2013 1456   PROTEINUR NEG 04/05/2013 1456   UROBILINOGEN 0.2 04/05/2013 1456   NITRITE NEG 04/05/2013 1456   LEUKOCYTESUR NEG 04/05/2013 1456   Sepsis Labs: @LABRCNTIP (procalcitonin:4,lacticidven:4) )No results found for this or any previous visit (from the past 240 hour(s)).   Radiological Exams on Admission: Ct Head Wo Contrast  Result Date: 11/20/2017 CLINICAL DATA:  Left-sided weakness the past few months. Numbness and tingling of the extremities. EXAM: CT HEAD WITHOUT CONTRAST TECHNIQUE: Contiguous axial images were obtained from the base of the skull through the vertex without intravenous contrast. COMPARISON:  CT head report dated January 26, 1999. FINDINGS: Brain: Focal loss of the normal gray-white matter  differentiation in the right pre and postcentral gyri. No evidence of acute hemorrhage, hydrocephalus, extra-axial collection or mass lesion/mass effect. Old lacunar infarcts in the bilateral basal ganglia and right pons. Vascular: Atherosclerotic vascular calcification of the carotid siphons. No hyperdense vessel. Skull: Normal. Negative for fracture or focal lesion. Sinuses/Orbits: No acute finding. Other: None. IMPRESSION: 1. Loss of the normal  gray-white matter differentiation in the right pre and postcentral gyri, suspicious for subacute infarct given clinical history. 2. Chronic appearing lacunar infarcts in the bilateral basal ganglia and right pons. Electronically Signed   By: Obie Dredge M.D.   On: 11/20/2017 12:41   Mr Maxine Glenn Head Wo Contrast  Result Date: 11/20/2017 CLINICAL DATA:  Left-sided weakness for several months. Numbness and tingling in the extremities. EXAM: MRI HEAD WITHOUT CONTRAST MRA HEAD WITHOUT CONTRAST MRI CERVICAL SPINE WITHOUT CONTRAST TECHNIQUE: Multiplanar, multiecho pulse sequences of the brain and surrounding structures, and cervical spine, to include the craniocervical junction and cervicothoracic junction, were obtained without intravenous contrast. Angiographic images of the head were obtained using MRA technique without contrast. COMPARISON:  Head CT 11/20/2017 FINDINGS: MRI HEAD FINDINGS Brain: Small scattered acute to early subacute cortical and white matter infarcts are present in the right cerebral hemisphere which appear to largely be along superficial and deep watershed zones. The largest confluent area of infarction involves 2 cm of right frontoparietal cortex at the vertex, corresponding to the abnormality described on CT. Multiple sagittally oriented infarcts are present in the right centrum semiovale. Small right parieto-occipital cortical infarcts are noted in the posterior MCA/watershed territory. There are a couple of punctate acute right temporal lobe cortical  infarcts, and there is a small acute infarct involving the right basal ganglia and anterior limb of internal capsule. No acute left cerebral hemispheric or posterior fossa infarcts are identified. Chronic microhemorrhages are noted in the posterior left frontal lobe at the level of the corona radiata and in the right cerebellum. There are small cortical infarcts in the posterior right temporal lobe and lateral right occipital lobe as well as small white matter infarcts in the right centrum semiovale which have a more late subacute to chronic appearance. Chronic lacunar infarcts are also noted in the basal ganglia bilaterally and in the right pons. The ventricles are normal in size. No mass, midline shift, or extra-axial fluid collection is present. Vascular: Abnormal appearance of the distal right internal carotid artery with some is centric. Skull and upper cervical spine: Unremarkable bone marrow signal. Sinuses/Orbits: Unremarkable orbits. Minimal mucosal thickening in the frontal, ethmoid, and right sphenoid sinuses. Clear mastoid air cells. Other: None. MRA HEAD FINDINGS The visualized distal vertebral arteries are patent to the basilar and codominant. There is mild left V4 stenosis at the vertebrobasilar junction. Patent PICA, AICA, and SCA origins are visualized bilaterally. The basilar artery is patent with motion artifact through its proximal portion and no evidence of significant stenosis. There is a predominantly fetal type origin of the right PCA, possibly with a diminutive and diseased right P1 segment. The large right posterior communicating artery is widely patent, as is the left PCA. The included distal cervical and intracranial segments of both internal carotid arteries are patent to the carotid termini. However, the right ICA is diffusely small in caliber compared to the left. There is abnormal signal diffusely surrounding the right ICA as it enters the skull base as well as throughout the petrous  portion. Flow related enhancement is mildly diminished throughout the distal right petrous ICA extending to the petrous-cavernous junction, and there is superimposed mild additional narrowing throughout this region. The ACAs and MCAs are patent without evidence of proximal branch occlusion or significant proximal stenosis, although the right ACA and right MCA appears slightly attenuated with less robust flow diffusely compared to the contralateral side. No aneurysm is identified. MRI CERVICAL SPINE FINDINGS The study is mildly motion degraded.  Alignment: Cervical spine straightening.  No significant listhesis. Vertebrae: No fracture, suspicious osseous lesion, or significant marrow edema. Type 2 degenerative endplate changes from C5-T1. Cord: Normal signal and morphology. Posterior Fossa, vertebral arteries, paraspinal tissues: Preserved vertebral artery flow voids. Abnormal appearance of the proximal right ICA flow void. Unremarkable paraspinal soft tissues. Disc levels: C2-3: Asymmetric right facet arthrosis with likely ankylosis across the joint. Mild uncovertebral spurring. Mild right neural foraminal stenosis. No spinal stenosis. C3-4: Moderate disc space narrowing. Disc bulging, uncovertebral spurring, and mild facet arthrosis result in mild spinal stenosis and moderate to severe bilateral neural foraminal stenosis. C4-5: Mild disc bulging, mild uncovertebral spurring, and mild facet arthrosis result in mild-to-moderate right and moderate left neural foraminal stenosis and borderline spinal stenosis. C5-6: Right greater than left uncovertebral hypertrophy results in severe right and moderate to severe left neural foraminal stenosis. Minimal disc bulging does not result in significant spinal stenosis. C6-7: Uncovertebral spurring results in moderate to severe bilateral neural foraminal stenosis. No spinal stenosis. C7-T1: Uncovertebral spurring and mild disc bulging result in moderate to severe bilateral neural  foraminal stenosis without spinal stenosis. T1-2: Bilateral foraminal disc protrusions result in moderate bilateral neural foraminal stenosis without spinal stenosis. IMPRESSION: 1. 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 2. 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 3. Diffusely abnormal appearance of the distal cervical and proximal intracranial portions of the right ICA including mild diffuse narrowing. Head and neck CTA is recommended to further evaluate for a possible dissection or flow limiting proximal stenosis. 4. Diffuse cervical spondylosis with moderate to severe multilevel neural foraminal stenosis as above. 5. Mild spinal stenosis at C3-4. Electronically Signed   By: Sebastian Ache M.D.   On: 11/20/2017 16:21   Mr Brain Wo Contrast  Result Date: 11/20/2017 CLINICAL DATA:  Left-sided weakness for several months. Numbness and tingling in the extremities. EXAM: MRI HEAD WITHOUT CONTRAST MRA HEAD WITHOUT CONTRAST MRI CERVICAL SPINE WITHOUT CONTRAST TECHNIQUE: Multiplanar, multiecho pulse sequences of the brain and surrounding structures, and cervical spine, to include the craniocervical junction and cervicothoracic junction, were obtained without intravenous contrast. Angiographic images of the head were obtained using MRA technique without contrast. COMPARISON:  Head CT 11/20/2017 FINDINGS: MRI HEAD FINDINGS Brain: Small scattered acute to early subacute cortical and white matter infarcts are present in the right cerebral hemisphere which appear to largely be along superficial and deep watershed zones. The largest confluent area of infarction involves 2 cm of right frontoparietal cortex at the vertex, corresponding to the abnormality described on CT. Multiple sagittally oriented infarcts are present in the right centrum semiovale. Small right parieto-occipital cortical  infarcts are noted in the posterior MCA/watershed territory. There are a couple of punctate acute right temporal lobe cortical infarcts, and there is a small acute infarct involving the right basal ganglia and anterior limb of internal capsule. No acute left cerebral hemispheric or posterior fossa infarcts are identified. Chronic microhemorrhages are noted in the posterior left frontal lobe at the level of the corona radiata and in the right cerebellum. There are small cortical infarcts in the posterior right temporal lobe and lateral right occipital lobe as well as small white matter infarcts in the right centrum semiovale which have a more late subacute to chronic appearance. Chronic lacunar infarcts are also noted in the basal ganglia bilaterally and in the right pons. The ventricles are normal in size. No mass,  midline shift, or extra-axial fluid collection is present. Vascular: Abnormal appearance of the distal right internal carotid artery with some is centric. Skull and upper cervical spine: Unremarkable bone marrow signal. Sinuses/Orbits: Unremarkable orbits. Minimal mucosal thickening in the frontal, ethmoid, and right sphenoid sinuses. Clear mastoid air cells. Other: None. MRA HEAD FINDINGS The visualized distal vertebral arteries are patent to the basilar and codominant. There is mild left V4 stenosis at the vertebrobasilar junction. Patent PICA, AICA, and SCA origins are visualized bilaterally. The basilar artery is patent with motion artifact through its proximal portion and no evidence of significant stenosis. There is a predominantly fetal type origin of the right PCA, possibly with a diminutive and diseased right P1 segment. The large right posterior communicating artery is widely patent, as is the left PCA. The included distal cervical and intracranial segments of both internal carotid arteries are patent to the carotid termini. However, the right ICA is diffusely small in caliber compared to the  left. There is abnormal signal diffusely surrounding the right ICA as it enters the skull base as well as throughout the petrous portion. Flow related enhancement is mildly diminished throughout the distal right petrous ICA extending to the petrous-cavernous junction, and there is superimposed mild additional narrowing throughout this region. The ACAs and MCAs are patent without evidence of proximal branch occlusion or significant proximal stenosis, although the right ACA and right MCA appears slightly attenuated with less robust flow diffusely compared to the contralateral side. No aneurysm is identified. MRI CERVICAL SPINE FINDINGS The study is mildly motion degraded. Alignment: Cervical spine straightening.  No significant listhesis. Vertebrae: No fracture, suspicious osseous lesion, or significant marrow edema. Type 2 degenerative endplate changes from C5-T1. Cord: Normal signal and morphology. Posterior Fossa, vertebral arteries, paraspinal tissues: Preserved vertebral artery flow voids. Abnormal appearance of the proximal right ICA flow void. Unremarkable paraspinal soft tissues. Disc levels: C2-3: Asymmetric right facet arthrosis with likely ankylosis across the joint. Mild uncovertebral spurring. Mild right neural foraminal stenosis. No spinal stenosis. C3-4: Moderate disc space narrowing. Disc bulging, uncovertebral spurring, and mild facet arthrosis result in mild spinal stenosis and moderate to severe bilateral neural foraminal stenosis. C4-5: Mild disc bulging, mild uncovertebral spurring, and mild facet arthrosis result in mild-to-moderate right and moderate left neural foraminal stenosis and borderline spinal stenosis. C5-6: Right greater than left uncovertebral hypertrophy results in severe right and moderate to severe left neural foraminal stenosis. Minimal disc bulging does not result in significant spinal stenosis. C6-7: Uncovertebral spurring results in moderate to severe bilateral neural  foraminal stenosis. No spinal stenosis. C7-T1: Uncovertebral spurring and mild disc bulging result in moderate to severe bilateral neural foraminal stenosis without spinal stenosis. T1-2: Bilateral foraminal disc protrusions result in moderate bilateral neural foraminal stenosis without spinal stenosis. IMPRESSION: 1. 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 2. 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 3. Diffusely abnormal appearance of the distal cervical and proximal intracranial portions of the right ICA including mild diffuse narrowing. Head and neck CTA is recommended to further evaluate for a possible dissection or flow limiting proximal stenosis. 4. Diffuse cervical spondylosis with moderate to severe multilevel neural foraminal stenosis as above. 5. Mild spinal stenosis at C3-4. Electronically Signed   By: Sebastian Ache M.D.   On: 11/20/2017 16:21   Mr Cervical Spine Wo Contrast  Result Date: 11/20/2017 CLINICAL DATA:  Left-sided weakness for several months.  Numbness and tingling in the extremities. EXAM: MRI HEAD WITHOUT CONTRAST MRA HEAD WITHOUT CONTRAST MRI CERVICAL SPINE WITHOUT CONTRAST TECHNIQUE: Multiplanar, multiecho pulse sequences of the brain and surrounding structures, and cervical spine, to include the craniocervical junction and cervicothoracic junction, were obtained without intravenous contrast. Angiographic images of the head were obtained using MRA technique without contrast. COMPARISON:  Head CT 11/20/2017 FINDINGS: MRI HEAD FINDINGS Brain: Small scattered acute to early subacute cortical and white matter infarcts are present in the right cerebral hemisphere which appear to largely be along superficial and deep watershed zones. The largest confluent area of infarction involves 2 cm of right frontoparietal cortex at the vertex, corresponding to the  abnormality described on CT. Multiple sagittally oriented infarcts are present in the right centrum semiovale. Small right parieto-occipital cortical infarcts are noted in the posterior MCA/watershed territory. There are a couple of punctate acute right temporal lobe cortical infarcts, and there is a small acute infarct involving the right basal ganglia and anterior limb of internal capsule. No acute left cerebral hemispheric or posterior fossa infarcts are identified. Chronic microhemorrhages are noted in the posterior left frontal lobe at the level of the corona radiata and in the right cerebellum. There are small cortical infarcts in the posterior right temporal lobe and lateral right occipital lobe as well as small white matter infarcts in the right centrum semiovale which have a more late subacute to chronic appearance. Chronic lacunar infarcts are also noted in the basal ganglia bilaterally and in the right pons. The ventricles are normal in size. No mass, midline shift, or extra-axial fluid collection is present. Vascular: Abnormal appearance of the distal right internal carotid artery with some is centric. Skull and upper cervical spine: Unremarkable bone marrow signal. Sinuses/Orbits: Unremarkable orbits. Minimal mucosal thickening in the frontal, ethmoid, and right sphenoid sinuses. Clear mastoid air cells. Other: None. MRA HEAD FINDINGS The visualized distal vertebral arteries are patent to the basilar and codominant. There is mild left V4 stenosis at the vertebrobasilar junction. Patent PICA, AICA, and SCA origins are visualized bilaterally. The basilar artery is patent with motion artifact through its proximal portion and no evidence of significant stenosis. There is a predominantly fetal type origin of the right PCA, possibly with a diminutive and diseased right P1 segment. The large right posterior communicating artery is widely patent, as is the left PCA. The included distal cervical and intracranial  segments of both internal carotid arteries are patent to the carotid termini. However, the right ICA is diffusely small in caliber compared to the left. There is abnormal signal diffusely surrounding the right ICA as it enters the skull base as well as throughout the petrous portion. Flow related enhancement is mildly diminished throughout the distal right petrous ICA extending to the petrous-cavernous junction, and there is superimposed mild additional narrowing throughout this region. The ACAs and MCAs are patent without evidence of proximal branch occlusion or significant proximal stenosis, although the right ACA and right MCA appears slightly attenuated with less robust flow diffusely compared to the contralateral side. No aneurysm is identified. MRI CERVICAL SPINE FINDINGS The study is mildly motion degraded. Alignment: Cervical spine straightening.  No significant listhesis. Vertebrae: No fracture, suspicious osseous lesion, or significant marrow edema. Type 2 degenerative endplate changes from C5-T1. Cord: Normal signal and morphology. Posterior Fossa, vertebral arteries, paraspinal tissues: Preserved vertebral artery flow voids. Abnormal appearance of the proximal right ICA flow void. Unremarkable paraspinal soft tissues. Disc levels: C2-3: Asymmetric right facet arthrosis with likely  ankylosis across the joint. Mild uncovertebral spurring. Mild right neural foraminal stenosis. No spinal stenosis. C3-4: Moderate disc space narrowing. Disc bulging, uncovertebral spurring, and mild facet arthrosis result in mild spinal stenosis and moderate to severe bilateral neural foraminal stenosis. C4-5: Mild disc bulging, mild uncovertebral spurring, and mild facet arthrosis result in mild-to-moderate right and moderate left neural foraminal stenosis and borderline spinal stenosis. C5-6: Right greater than left uncovertebral hypertrophy results in severe right and moderate to severe left neural foraminal stenosis.  Minimal disc bulging does not result in significant spinal stenosis. C6-7: Uncovertebral spurring results in moderate to severe bilateral neural foraminal stenosis. No spinal stenosis. C7-T1: Uncovertebral spurring and mild disc bulging result in moderate to severe bilateral neural foraminal stenosis without spinal stenosis. T1-2: Bilateral foraminal disc protrusions result in moderate bilateral neural foraminal stenosis without spinal stenosis. IMPRESSION: 1. 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 2. 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 3. Diffusely abnormal appearance of the distal cervical and proximal intracranial portions of the right ICA including mild diffuse narrowing. Head and neck CTA is recommended to further evaluate for a possible dissection or flow limiting proximal stenosis. 4. Diffuse cervical spondylosis with moderate to severe multilevel neural foraminal stenosis as above. 5. Mild spinal stenosis at C3-4. Electronically Signed   By: Sebastian Ache M.D.   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 with most acute infarct primarily right cerebral hemisphere -Patient presents with chronic left-sided weakness and numbness for at least 3 to 6 months that has worsened over the past several days with imaging consistent with bilateral cerebral infarcts with most recent being on the right side -Of note there was also some concern over possible right ICA stenosis which pending imaging may be amenable to surgery -Appreciate neuro hospitalist assistance -Stat CTA head and neck to better characterize vascular abnormality seen on MRA of  brain -Echocardiogram -HgbA1c/FLP -Patient was not taking statin at home as previously prescribed back in 2016 therefore will add Lipitor 80 mg daily -Low-dose aspirin for antiplatelet -Permissive hypertension-tx only if SBP >/= 220 w/ IV hydralazine -SLP/PT/OT evaluation -Frequent neurological checks  Active Problems:   Dyslipidemia  -Noncompliant with previously prescribed Lipitor in 2016 -Follow-up on FLP -Statin initiated 11/20/17    ? Hypertension -Patient denies history of previously diagnosed hypertension or requirement for antihypertensive medications -Current blood pressure well controlled    Polysubstance abuse -Patient admits to daily tobacco with at least 3 cigars daily -Also admits to daily THC-no apparent history of cyclic vomiting -Also admits to 2-3, 40 ounce beers daily -Nicotine patch; tobacco cessation counseling ordered -Ativan CIWA w/ prn dosing    **Additional lab, imaging and/or diagnostic evaluation at discretion of supervising physician  DVT prophylaxis: Lovenox Code Status: Full Family Communication: No family at bedside Disposition Plan: TBD pending PT/OT evaluation Consults called: Neuro hospitalist/[place]    , L. ANP-BC [facility] Pager [phone]   If 7PM-7AM, please contact night-coverage [email or web address] Password [facility]  11/20/2017, 6:03 PM

## 2017-11-20 NOTE — ED Provider Notes (Signed)
[facility] Provider Note   CSN: [id number] Arrival date & time: 11/20/17  1106     History   Chief Complaint Chief Complaint  Patient presents with  . Extremity Weakness    HPI Nathan Hernandez is a 57 y.o. male with a PMHx of HLD, HTN, and R knee OA, who presents to the ED with complaints of gradually worsening left-sided weakness for the last 6 months.  He also reports intermittent numbness and tingling in the left fingertips and toe tips as well as left knee pain that radiates down into his leg which also occurs intermittently.  He has had knee pain for a while, had surgery several years ago, therefore he is not sure whether it is associated with his weakness or not.  He notices that sleeping on his left side seems to worsen his symptoms, no treatments tried prior to arrival and no known alleviating factors noted.  [facility]  He denies any fevers, chills, headache, vision changes, CP, SOB, abdominal pain, nausea, vomiting, diarrhea, constipation, dysuria, hematuria, incontinence of urine or stool, saddle anesthesia or cauda equina symptoms, neck pain, other myalgias/arthralgias, or any other complaints at this time.  The history is provided by the patient and medical records. No language interpreter was used.    History reviewed. No pertinent past medical history.  Patient Active Problem List   Diagnosis Date Noted  . HLD (hyperlipidemia) 05/20/2014  . Osteoarthritis of right knee 11/27/2013  . Establishing care with new doctor, encounter for 04/05/2013  . Poor vision 04/05/2013  . High blood pressure 04/05/2013  . Knee pain, acute 04/05/2013    Past Surgical History:  Procedure Laterality Date  . KNEE SURGERY          Home Medications    Prior to Admission medications   Medication Sig Start Date End Date Taking? Authorizing Provider  atorvastatin (LIPITOR) 20 MG tablet Take 1 tablet (20  mg total) by mouth daily at 6 PM. 08/01/14   Ambrose FinlandKeck, Valerie A, NP  ibuprofen (ADVIL,MOTRIN) 600 MG tablet Take 1 tablet (600 mg total) by mouth every 8 (eight) hours as needed. 08/01/14   Ambrose FinlandKeck, Valerie A, NP  methocarbamol (ROBAXIN) 500 MG tablet Take 1 tablet (500 mg total) by mouth 2 (two) times daily. 10/08/15   Garlon HatchetSanders, Lisa M, PA-C  naproxen (NAPROSYN) 500 MG tablet Take 1 tablet (500 mg total) by mouth 2 (two) times daily with a meal. 10/08/15   Garlon HatchetSanders, Lisa M, PA-C  traMADol (ULTRAM) 50 MG tablet Take 1 tablet (50 mg total) by mouth every 8 (eight) hours as needed. Patient not taking: Reported on 08/01/2014 05/12/14   Ambrose FinlandKeck, Valerie A, NP    Family History History reviewed. No pertinent family history.  Social History Social History   Tobacco Use  . Smoking status: Current Every Day Smoker    Packs/day: 1.00    Types: Cigars  . Smokeless tobacco: Never Used  Substance Use Topics  . Alcohol use: Yes  . Drug use: Yes    Frequency: 7.0 times per week    Types: Marijuana     Allergies   Patient has no known allergies.   Review of Systems Review of Systems  Constitutional: Negative for chills and fever.  Eyes: Negative for visual disturbance.  Respiratory: Negative for shortness of breath.   Cardiovascular: Negative for chest pain.  Gastrointestinal: Negative for abdominal pain, constipation, diarrhea, nausea and vomiting.  Genitourinary: Negative for difficulty urinating (no incontinence), dysuria and hematuria.  Musculoskeletal: Positive for arthralgias. Negative for myalgias and neck pain.  Skin: Negative for color change.  Allergic/Immunologic: Negative for immunocompromised state.  Neurological: Positive for weakness. Negative for numbness and headaches.  Psychiatric/Behavioral: Negative for confusion.   All other systems reviewed and are negative for acute change except as noted in the HPI.    Physical Exam Updated Vital Signs BP (!) 141/94 (BP Location: Left Arm)    Pulse 66   Temp 98.7 F (37.1 C) (Oral)   Resp 18   SpO2 100%   Physical Exam  Constitutional: He is oriented to person, place, and time. Vital signs are normal. He appears well-developed and well-nourished.  Non-toxic appearance. No distress.  Afebrile, nontoxic, NAD  HENT:  Head: Normocephalic and atraumatic.  Mouth/Throat: Oropharynx is clear and moist and mucous membranes are normal.  Eyes: Pupils are equal, round, and reactive to light. Conjunctivae and EOM are normal. Right eye exhibits no discharge. Left eye exhibits no discharge.  PERRL, EOMI, no nystagmus  Neck: Normal range of motion. Neck supple. No spinous process tenderness and no muscular tenderness present. No neck rigidity. Normal range of motion present.  FROM intact without spinous process TTP, no bony stepoffs or deformities, no paraspinous muscle TTP or muscle spasms. No rigidity or meningeal signs. No bruising or swelling.   Cardiovascular: Normal rate, regular rhythm, normal heart sounds and intact distal pulses. Exam reveals no gallop and no friction rub.  No murmur heard. Pulmonary/Chest: Effort normal and breath sounds normal. No respiratory distress. He has no decreased breath sounds. He has no wheezes. He has no rhonchi. He has no rales.  Abdominal: Soft. Normal appearance and bowel sounds are normal. He exhibits no distension. There is no tenderness. There is no rigidity, no rebound, no guarding, no CVA tenderness, no tenderness at McBurney's point and negative Murphy's sign.  Musculoskeletal: Normal range of motion.       Cervical back: Normal.       Thoracic back: Normal.       Lumbar back: Normal.  C-spine as above, all other spinal levels nonTTP without bony stepoffs or deformities MAE x4 Strength testing in L arm with mild weakness (4/5) in deltoid and wrist extension muscle testing, but otherwise remainder of muscle groups with symmetric strength bilaterally and strength grossly intact.  Sensation grossly  intact in all extremities Distal pulses intact Gait steady using cane  Neurological: He is alert and oriented to person, place, and time. No cranial nerve deficit or sensory deficit. Coordination and gait normal. GCS eye subscore is 4. GCS verbal subscore is 5. GCS motor subscore is 6.  CN 2-12 grossly intact A&O x4 GCS 15 Sensation and strength intact as mentioned above, with mild weakness to L arm deltoid and wrist extension muscle testing Gait nonataxic including with tandem walking using cane Coordination with finger-to-nose WNL Neg pronator drift   Skin: Skin is warm, dry and intact. No rash noted.  Psychiatric: He has a normal mood and affect.  Nursing note and vitals reviewed.    ED Treatments / Results  Labs (all labs ordered are listed, but only abnormal results are displayed) Labs Reviewed  CBC - Abnormal; Notable for the following components:      Result Value   RDW 15.6 (*)    All other components within normal limits  COMPREHENSIVE METABOLIC PANEL - Abnormal; Notable for the following components:   BUN <5 (*)  All other components within normal limits  PROTIME-INR  APTT  DIFFERENTIAL  I-STAT TROPONIN, ED    EKG EKG Interpretation  Date/Time:  Monday November 20 2017 11:37:19 EDT Ventricular Rate:  68 PR Interval:  156 QRS Duration: 78 QT Interval:  392 QTC Calculation: 416 R Axis:   59 Text Interpretation:  Normal sinus rhythm Normal ECG Confirmed by Tilden Fossa [phone]) on 11/20/2017 12:08:53 PM   Radiology Ct Head Wo Contrast  Result Date: 11/20/2017 CLINICAL DATA:  Left-sided weakness the past few months. Numbness and tingling of the extremities. EXAM: CT HEAD WITHOUT CONTRAST TECHNIQUE: Contiguous axial images were obtained from the base of the skull through the vertex without intravenous contrast. COMPARISON:  CT head report dated January 26, 1999. FINDINGS: Brain: Focal loss of the normal gray-white matter differentiation in the right pre and  postcentral gyri. No evidence of acute hemorrhage, hydrocephalus, extra-axial collection or mass lesion/mass effect. Old lacunar infarcts in the bilateral basal ganglia and right pons. Vascular: Atherosclerotic vascular calcification of the carotid siphons. No hyperdense vessel. Skull: Normal. Negative for fracture or focal lesion. Sinuses/Orbits: No acute finding. Other: None. IMPRESSION: 1. Loss of the normal gray-white matter differentiation in the right pre and postcentral gyri, suspicious for subacute infarct given clinical history. 2. Chronic appearing lacunar infarcts in the bilateral basal ganglia and right pons. Electronically Signed   By: Obie Dredge M.D.   On: 11/20/2017 12:41    Procedures Procedures (including critical care time)  CRITICAL CARE-acute/subacute stroke Performed by: Rhona Raider   Total critical care time: 45 minutes  Critical care time was exclusive of separately billable procedures and treating other patients.  Critical care was necessary to treat or prevent imminent or life-threatening deterioration.  Critical care was time spent personally by me on the following activities: development of treatment plan with patient and/or surrogate as well as nursing, discussions with consultants, evaluation of patient's response to treatment, examination of patient, obtaining history from patient or surrogate, ordering and performing treatments and interventions, ordering and review of laboratory studies, ordering and review of radiographic studies, pulse oximetry and re-evaluation of patient's condition.   Medications Ordered in ED Medications  aspirin tablet 325 mg (325 mg Oral Given 11/20/17 1700)     Initial Impression / Assessment and Plan / ED Course  I have reviewed the triage vital signs and the nursing notes.  Pertinent labs & imaging results that were available during my care of the patient were reviewed by me and considered in my medical decision making  (see chart for details).     57 y.o. male here with 6 months of gradually worsening L sided weakness. On exam, some objective weakness of deltoid and wrist extension functioning, but otherwise fairly symmetric muscle testing bilaterally, no focal neuro deficits, gait steady with cane including tandem walking. Work up thus far reveals: EKG unremarkable, trop neg, INR/APTT WNL, CBC w/diff WNL, CMP WNL, CT head with loss of normal grey-white matter differentiation in R pre/post-central gyri suspicious for subacute infarct given clinical history, and chronic appearing lacunar infarcts of b/l basal ganglia and R pons. Will consult neurology to discuss these findings.   12:55 PM Dr. Laurence Slate returning page, states the history wouldn't match the CT findings, thinks it's likely a neuromuscular disorder; recommends MRI head and c-spine WITHOUT contrast to r/o stroke or other emergent pathology, doesn't want MRA head at this time; if those are normal then outpatient neuro f/up recommended. Discussed case with my attending Dr. Madilyn Hook who  agrees with plan. Will proceed with MRI testing and then reassess after that.   4:50 PM MRI/MRA head multiple small infarcts throughout R cerebral hemisphere primarily in watershed distribution and varying in age from acute to late subacute/chronic; chronic b/l basal ganglia and pontine lacunar infarcts. MRA head added on since MRI head showed strokes. MRI C-spine showing diffusely abnormal appearance to distal cervical and proximal intracranial portions of R ICA including mild diffuse narrowing, recommends CTA head/neck for further eval, also shows diffuse cervical spondylosis with moderate to severe multilevel neural foraminal stenosis and mild spinal stenosis at C3-4. Spoke with Dr. Laurence Slate of neurology again, who stated it's likely he had a stroke remotely and then had another acute stroke more recently, suspects carotid artery etiology, wants CTA neck (but NOT head) at this time, and  medical admission. Wants ASA 325mg  started, and will consult on pt while he's in the hospital. I will consult for medical admission. I've updated pt on plan and he is agreeable.   5:40 PM Junious Silk NP of [facility] returning page and will admit. Holding orders to be placed by admitting team. Please see their notes for further documentation of care. I appreciate their help with this pleasant pt's care. Pt stable at time of admission.    Final Clinical Impressions(s) / ED Diagnoses   Final diagnoses:  Cerebrovascular accident (CVA), unspecified mechanism [facility])  Left-sided weakness  Paresthesias/numbness    ED Discharge Orders    [address] [place], [place] 11/20/17 1740    Tilden Fossa, MD 11/22/17 1425

## 2017-11-21 ENCOUNTER — Inpatient Hospital Stay (HOSPITAL_COMMUNITY): Payer: Medicaid Other

## 2017-11-21 DIAGNOSIS — E785 Hyperlipidemia, unspecified: Secondary | ICD-10-CM

## 2017-11-21 DIAGNOSIS — I6521 Occlusion and stenosis of right carotid artery: Secondary | ICD-10-CM

## 2017-11-21 DIAGNOSIS — I10 Essential (primary) hypertension: Secondary | ICD-10-CM

## 2017-11-21 DIAGNOSIS — F1290 Cannabis use, unspecified, uncomplicated: Secondary | ICD-10-CM

## 2017-11-21 LAB — ECHOCARDIOGRAM COMPLETE
Height: 72 in
WEIGHTICAEL: 2673.74 [oz_av]

## 2017-11-21 LAB — LIPID PANEL
Cholesterol: 161 mg/dL (ref 0–200)
HDL: 57 mg/dL (ref >40)
LDL CALC: 89 mg/dL (ref 0–99)
Total CHOL/HDL Ratio: 2.8 RATIO
Triglycerides: 76 mg/dL (ref <150)
VLDL: 15 mg/dL (ref 0–40)

## 2017-11-21 LAB — HEMOGLOBIN A1C
HEMOGLOBIN A1C: 5.6 % (ref 4.8–5.6)
Mean Plasma Glucose: 114.02 mg/dL

## 2017-11-21 LAB — HIV ANTIBODY (ROUTINE TESTING W REFLEX): HIV Screen 4th Generation wRfx: NONREACTIVE

## 2017-11-21 MED ORDER — CLOPIDOGREL BISULFATE 75 MG PO TABS
75.0000 mg | ORAL_TABLET | Freq: Every day | ORAL | Status: DC
  Administered 2017-11-21 – 2017-11-22 (×2): 75 mg via ORAL
  Filled 2017-11-21 (×2): qty 1

## 2017-11-21 NOTE — Plan of Care (Signed)
  Problem: Clinical Measurements: Goal: Ability to maintain clinical measurements within normal limits will improve Outcome: Progressing Goal: Will remain free from infection Outcome: Progressing   Problem: Activity: Goal: Risk for activity intolerance will decrease Outcome: Progressing   Problem: Nutrition: Goal: Adequate nutrition will be maintained Outcome: Progressing   Problem: Elimination: Goal: Will not experience complications related to bowel motility Outcome: Progressing Goal: Will not experience complications related to urinary retention Outcome: Progressing   Problem: Pain Managment: Goal: General experience of comfort will improve Outcome: Progressing   Problem: Safety: Goal: Ability to remain free from injury will improve Outcome: Progressing   Problem: Skin Integrity: Goal: Risk for impaired skin integrity will decrease Outcome: Progressing   Problem: Nutrition: Goal: Risk of aspiration will decrease Outcome: Progressing   Problem: Ischemic Stroke/TIA Tissue Perfusion: Goal: Complications of ischemic stroke/TIA will be minimized Outcome: Progressing

## 2017-11-21 NOTE — Consult Note (Addendum)
[facility] NOTE   MRN : [id number]  Reason for Consult: right ICA stenosis near occlusion s/p right multiple infarts Referring Physician: Dr. Pearlean Brownie  History of Present Illness: 57 y/o male presented to the ED with acute on chronic left UE weakness and decreased sensation.  He has noted weakness for about 6 month with sudden changes since Saturday.  He denise amaurosis and aphasia.His family was not aware and convinced him to come to the hospital.    He states he doses not go to doctors and does not take any prescription medications.  Past medical history includes: family history of CAD, HTN, Stroke and DM.  He is a smoker and drinks alcohol frequently.  He reports hx of dyslipidemia and was prescribed Lipitor in the past.       Current Facility-Administered Medications  Medication Dose Route Frequency Provider Last Rate Last Dose  . acetaminophen (TYLENOL) tablet 650 mg  650 mg Oral Q4H PRN Russella Dar, NP       Or  . acetaminophen (TYLENOL) solution 650 mg  650 mg Per Tube Q4H PRN Russella Dar, NP       Or  . acetaminophen (TYLENOL) suppository 650 mg  650 mg Rectal Q4H PRN Russella Dar, NP      . aspirin tablet 325 mg  325 mg Oral Daily Russella Dar, NP   325 mg at 11/21/17 0929  . atorvastatin (LIPITOR) tablet 80 mg  80 mg Oral q1800 Russella Dar, NP   80 mg at 11/20/17 1842  . enoxaparin (LOVENOX) injection 40 mg  40 mg Subcutaneous Q24H Russella Dar, NP   40 mg at 11/21/17 1191  . folic acid (FOLVITE) tablet 1 mg  1 mg Oral Daily Russella Dar, NP   1 mg at 11/21/17 0929  . hydrALAZINE (APRESOLINE) injection 10 mg  10 mg Intravenous Q6H PRN Russella Dar, NP      . LORazepam (ATIVAN) tablet 1 mg  1 mg Oral Q6H PRN Russella Dar, NP       Or  . LORazepam (ATIVAN) injection 1 mg  1 mg Intravenous Q6H PRN Russella Dar, NP      . multivitamin with minerals tablet 1 tablet  1 tablet Oral Daily Russella Dar,  NP   1 tablet at 11/21/17 0929  . nicotine (NICODERM CQ - dosed in mg/24 hours) patch 21 mg  21 mg Transdermal Daily Russella Dar, NP   21 mg at 11/21/17 0929  . senna-docusate (Senokot-S) tablet 1 tablet  1 tablet Oral QHS PRN Russella Dar, NP      . thiamine (VITAMIN B-1) tablet 100 mg  100 mg Oral Daily Russella Dar, NP   100 mg at 11/21/17 4782   Or  . thiamine (B-1) injection 100 mg  100 mg Intravenous Daily Russella Dar, NP        Pt meds include: Statin :No Betablocker: No ASA: No Other anticoagulants/antiplatelets: none  Past Medical History:  Diagnosis Date  . HLD (hyperlipidemia)   . Hypertension   . Osteoarthritis   . Polysubstance abuse [facility])     Past Surgical History:  Procedure Laterality Date  . KNEE SURGERY      Social History Social History   Tobacco Use  . Smoking status: Current Every Day Smoker    Packs/day: 1.00    Types: Cigars  . Smokeless tobacco: Never Used  Substance  Use Topics  . Alcohol use: Yes  . Drug use: Yes    Frequency: 7.0 times per week    Types: Marijuana    Family History Family History  Problem Relation Age of Onset  . Diabetes Mother   . Hypertension Mother   . Hypertension Father   . Diabetes Father     No Known Allergies   REVIEW OF SYSTEMS  General: [ ]  Weight loss, [ ]  Fever, [ ]  chills Neurologic: [ ]  Dizziness, [ ]  Blackouts, [ ]  Seizure [ ]  Stroke, [ ]  "Mini stroke", [ ]  Slurred speech, [ ]  Temporary blindness; [x ] weakness in arms or legs, [ ]  Hoarseness [ ]  Dysphagia Cardiac: [ ]  Chest pain/pressure, [ ]  Shortness of breath at rest [ ]  Shortness of breath with exertion, [ ]  Atrial fibrillation or irregular heartbeat  Vascular: [ ]  Pain in legs with walking, [ ]  Pain in legs at rest, [ ]  Pain in legs at night,  [ ]  Non-healing ulcer, [ ]  Blood clot in vein/DVT,   Pulmonary: [ ]  Home oxygen, [ ]  Productive cough, [ ]  Coughing up blood, [ ]  Asthma,  [ ]  Wheezing [ ]  COPD Musculoskeletal:  [x  ] Arthritis, [ ]  Low back pain, [x ] Joint pain Hematologic: [ ]  Easy Bruising, [ ]  Anemia; [ ]  Hepatitis Gastrointestinal: [ ]  Blood in stool, [ ]  Gastroesophageal Reflux/heartburn, Urinary: [ ]  chronic Kidney disease, [ ]  on HD - [ ]  MWF or [ ]  TTHS, [ ]  Burning with urination, [ ]  Difficulty urinating Skin: [ ]  Rashes, [ ]  Wounds Psychological: [ ]  Anxiety, [ ]  Depression  Physical Examination Vitals:   11/21/17 0920 11/21/17 0921 11/21/17 1111 11/21/17 1112  BP: (!) 155/92   137/89  Pulse:   (!) 50 (!) 55  Resp:  16 12 18   Temp:  98 F (36.7 C) (!) 97.5 F (36.4 C)   TempSrc:  Oral Oral   SpO2: 98% 99% 99% 95%  Weight:      Height:       Body mass index is 22.66 kg/m.  General:  WDWN in NAD HENT: WNL Eyes: Pupils equal Pulmonary: normal non-labored breathing , without Rales, rhonchi,  wheezing Cardiac: RRR, without  Murmurs, rubs or gallops; No carotid bruits Abdomen: soft, NT, no masses Skin: no rashes, ulcers noted;  no Gangrene , no cellulitis; no open wounds;   Vascular Exam/Pulses:Palapble radial, femoral, DP/PT pulses B   Musculoskeletal: no muscle wasting or atrophy; no edema  Neurologic: A&O X 3; Appropriate Affect ;  SENSATION: normal; MOTOR FUNCTION: 5/5 right UE/LE, 3/5 left UE, 4/5 left LE Speech is fluent/normal   Significant Diagnostic Studies: CBC Lab Results  Component Value Date   WBC 5.4 11/20/2017   HGB 15.9 11/20/2017   HCT 48.8 11/20/2017   MCV 87.3 11/20/2017   PLT 311 11/20/2017    BMET    Component Value Date/Time   NA 140 11/20/2017 1143   K 3.9 11/20/2017 1143   CL 105 11/20/2017 1143   CO2 25 11/20/2017 1143   GLUCOSE 96 11/20/2017 1143   BUN <5 (L) 11/20/2017 1143   CREATININE 0.73 11/20/2017 1143   CREATININE 0.83 05/15/2014 0904   CALCIUM 9.3 11/20/2017 1143   GFRNONAA >60 11/20/2017 1143   GFRNONAA >89 05/15/2014 0904   GFRAA >60 11/20/2017 1143   GFRAA >89 05/15/2014 0904   Estimated Creatinine Clearance: 110.5  mL/min (by C-G formula based on SCr of 0.73 mg/dL).  COAG Lab Results  Component Value Date   INR 1.00 11/20/2017     Non-Invasive Vascular Imaging:  CTA neck   Right carotid system: Right common carotid artery patent from its origin to the bifurcation without hemodynamically significant stenosis. Mixed atheromatous plaque at the proximal right ICA with severe near occlusive stenosis (series 7, image 191). A string sign is faintly visible on axial sequences. Stenosis begins approximately 4 mm distal to the bifurcation and measures approximately 1 cm in length. Right ICA widely patent distally to the skull base without additional stenosis or occlusion.  Left carotid system: Scattered atheromatous plaque within the left common carotid artery without hemodynamically significant stenosis. A centric calcified plaque about the left bifurcation without significant stenosis. Left ICA patent distally to the skull base without stenosis, dissection, or occlusion.  MRI IMPRESSION: 1. 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 2. 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 3. Diffusely abnormal appearance of the distal cervical and proximal intracranial portions of the right ICA including mild diffuse narrowing. Head and neck CTA is recommended to further evaluate for a possible dissection or flow limiting proximal stenosis. 4. Diffuse cervical spondylosis with moderate to severe multilevel neural foraminal stenosis as above. 5. Mild spinal stenosis at C3-4.   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 Left UE/LE weakness with decreased sensation. Right ICA near occlusion Left ICA 50% stenosis He will need right CEA  in the near future to prevent future stroke events. He has been started on Aspirin 325 mg daily, Lipitor and Nicotine patches to help with smoking cessation.    Mosetta Pigeon 11/21/2017 1:57 PM  I agree with the above.  I have seen and evaluated the patient.  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  He states that he has not had any new symptoms since last Saturday.  He is currently on dual antiplatelet therapy.  He still has residual weakness and fine motor function loss in the left arm and leg.  I discussed proceeding with right carotid endarterectomy.  The risks and benefits of the procedure were discussed with the patient including the risk of stroke, nerve injury, numbness, bleeding, and cardiopulmonary complications.  All of his questions were answered.  He has tentatively been placed on the operating room schedule for Friday.  Durene Cal

## 2017-11-21 NOTE — H&P (View-Only) (Signed)
[facility] NOTE   MRN : [id number]  Reason for Consult: right ICA stenosis near occlusion s/p right multiple infarts Referring Physician: Dr. Pearlean Brownie  History of Present Illness: 57 y/o male presented to the ED with acute on chronic left UE weakness and decreased sensation.  He has noted weakness for about 6 month with sudden changes since Saturday.  He denise amaurosis and aphasia.His family was not aware and convinced him to come to the hospital.    He states he doses not go to doctors and does not take any prescription medications.  Past medical history includes: family history of CAD, HTN, Stroke and DM.  He is a smoker and drinks alcohol frequently.  He reports hx of dyslipidemia and was prescribed Lipitor in the past.       Current Facility-Administered Medications  Medication Dose Route Frequency Provider Last Rate Last Dose  . acetaminophen (TYLENOL) tablet 650 mg  650 mg Oral Q4H PRN Russella Dar, NP       Or  . acetaminophen (TYLENOL) solution 650 mg  650 mg Per Tube Q4H PRN Russella Dar, NP       Or  . acetaminophen (TYLENOL) suppository 650 mg  650 mg Rectal Q4H PRN Russella Dar, NP      . aspirin tablet 325 mg  325 mg Oral Daily Russella Dar, NP   325 mg at 11/21/17 0929  . atorvastatin (LIPITOR) tablet 80 mg  80 mg Oral q1800 Russella Dar, NP   80 mg at 11/20/17 1842  . enoxaparin (LOVENOX) injection 40 mg  40 mg Subcutaneous Q24H Russella Dar, NP   40 mg at 11/21/17 1191  . folic acid (FOLVITE) tablet 1 mg  1 mg Oral Daily Russella Dar, NP   1 mg at 11/21/17 0929  . hydrALAZINE (APRESOLINE) injection 10 mg  10 mg Intravenous Q6H PRN Russella Dar, NP      . LORazepam (ATIVAN) tablet 1 mg  1 mg Oral Q6H PRN Russella Dar, NP       Or  . LORazepam (ATIVAN) injection 1 mg  1 mg Intravenous Q6H PRN Russella Dar, NP      . multivitamin with minerals tablet 1 tablet  1 tablet Oral Daily Russella Dar,  NP   1 tablet at 11/21/17 0929  . nicotine (NICODERM CQ - dosed in mg/24 hours) patch 21 mg  21 mg Transdermal Daily Russella Dar, NP   21 mg at 11/21/17 0929  . senna-docusate (Senokot-S) tablet 1 tablet  1 tablet Oral QHS PRN Russella Dar, NP      . thiamine (VITAMIN B-1) tablet 100 mg  100 mg Oral Daily Russella Dar, NP   100 mg at 11/21/17 4782   Or  . thiamine (B-1) injection 100 mg  100 mg Intravenous Daily Russella Dar, NP        Pt meds include: Statin :No Betablocker: No ASA: No Other anticoagulants/antiplatelets: none  Past Medical History:  Diagnosis Date  . HLD (hyperlipidemia)   . Hypertension   . Osteoarthritis   . Polysubstance abuse [facility])     Past Surgical History:  Procedure Laterality Date  . KNEE SURGERY      Social History Social History   Tobacco Use  . Smoking status: Current Every Day Smoker    Packs/day: 1.00    Types: Cigars  . Smokeless tobacco: Never Used  Substance  Use Topics  . Alcohol use: Yes  . Drug use: Yes    Frequency: 7.0 times per week    Types: Marijuana    Family History Family History  Problem Relation Age of Onset  . Diabetes Mother   . Hypertension Mother   . Hypertension Father   . Diabetes Father     No Known Allergies   REVIEW OF SYSTEMS  General: [ ]  Weight loss, [ ]  Fever, [ ]  chills Neurologic: [ ]  Dizziness, [ ]  Blackouts, [ ]  Seizure [ ]  Stroke, [ ]  "Mini stroke", [ ]  Slurred speech, [ ]  Temporary blindness; [x ] weakness in arms or legs, [ ]  Hoarseness [ ]  Dysphagia Cardiac: [ ]  Chest pain/pressure, [ ]  Shortness of breath at rest [ ]  Shortness of breath with exertion, [ ]  Atrial fibrillation or irregular heartbeat  Vascular: [ ]  Pain in legs with walking, [ ]  Pain in legs at rest, [ ]  Pain in legs at night,  [ ]  Non-healing ulcer, [ ]  Blood clot in vein/DVT,   Pulmonary: [ ]  Home oxygen, [ ]  Productive cough, [ ]  Coughing up blood, [ ]  Asthma,  [ ]  Wheezing [ ]  COPD Musculoskeletal:  [x  ] Arthritis, [ ]  Low back pain, [x ] Joint pain Hematologic: [ ]  Easy Bruising, [ ]  Anemia; [ ]  Hepatitis Gastrointestinal: [ ]  Blood in stool, [ ]  Gastroesophageal Reflux/heartburn, Urinary: [ ]  chronic Kidney disease, [ ]  on HD - [ ]  MWF or [ ]  TTHS, [ ]  Burning with urination, [ ]  Difficulty urinating Skin: [ ]  Rashes, [ ]  Wounds Psychological: [ ]  Anxiety, [ ]  Depression  Physical Examination Vitals:   11/21/17 0920 11/21/17 0921 11/21/17 1111 11/21/17 1112  BP: (!) 155/92   137/89  Pulse:   (!) 50 (!) 55  Resp:  16 12 18   Temp:  98 F (36.7 C) (!) 97.5 F (36.4 C)   TempSrc:  Oral Oral   SpO2: 98% 99% 99% 95%  Weight:      Height:       Body mass index is 22.66 kg/m.  General:  WDWN in NAD HENT: WNL Eyes: Pupils equal Pulmonary: normal non-labored breathing , without Rales, rhonchi,  wheezing Cardiac: RRR, without  Murmurs, rubs or gallops; No carotid bruits Abdomen: soft, NT, no masses Skin: no rashes, ulcers noted;  no Gangrene , no cellulitis; no open wounds;   Vascular Exam/Pulses:Palapble radial, femoral, DP/PT pulses B   Musculoskeletal: no muscle wasting or atrophy; no edema  Neurologic: A&O X 3; Appropriate Affect ;  SENSATION: normal; MOTOR FUNCTION: 5/5 right UE/LE, 3/5 left UE, 4/5 left LE Speech is fluent/normal   Significant Diagnostic Studies: CBC Lab Results  Component Value Date   WBC 5.4 11/20/2017   HGB 15.9 11/20/2017   HCT 48.8 11/20/2017   MCV 87.3 11/20/2017   PLT 311 11/20/2017    BMET    Component Value Date/Time   NA 140 11/20/2017 1143   K 3.9 11/20/2017 1143   CL 105 11/20/2017 1143   CO2 25 11/20/2017 1143   GLUCOSE 96 11/20/2017 1143   BUN <5 (L) 11/20/2017 1143   CREATININE 0.73 11/20/2017 1143   CREATININE 0.83 05/15/2014 0904   CALCIUM 9.3 11/20/2017 1143   GFRNONAA >60 11/20/2017 1143   GFRNONAA >89 05/15/2014 0904   GFRAA >60 11/20/2017 1143   GFRAA >89 05/15/2014 0904   Estimated Creatinine Clearance: 110.5  mL/min (by C-G formula based on SCr of 0.73 mg/dL).  COAG Lab Results  Component Value Date   INR 1.00 11/20/2017     Non-Invasive Vascular Imaging:  CTA neck   Right carotid system: Right common carotid artery patent from its origin to the bifurcation without hemodynamically significant stenosis. Mixed atheromatous plaque at the proximal right ICA with severe near occlusive stenosis (series 7, image 191). A string sign is faintly visible on axial sequences. Stenosis begins approximately 4 mm distal to the bifurcation and measures approximately 1 cm in length. Right ICA widely patent distally to the skull base without additional stenosis or occlusion.  Left carotid system: Scattered atheromatous plaque within the left common carotid artery without hemodynamically significant stenosis. A centric calcified plaque about the left bifurcation without significant stenosis. Left ICA patent distally to the skull base without stenosis, dissection, or occlusion.  MRI IMPRESSION: 1. 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 2. 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 3. Diffusely abnormal appearance of the distal cervical and proximal intracranial portions of the right ICA including mild diffuse narrowing. Head and neck CTA is recommended to further evaluate for a possible dissection or flow limiting proximal stenosis. 4. Diffuse cervical spondylosis with moderate to severe multilevel neural foraminal stenosis as above. 5. Mild spinal stenosis at C3-4.   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 Left UE/LE weakness with decreased sensation. Right ICA near occlusion Left ICA 50% stenosis He will need right CEA  in the near future to prevent future stroke events. He has been started on Aspirin 325 mg daily, Lipitor and Nicotine patches to help with smoking cessation.    Mosetta Pigeon 11/21/2017 1:57 PM  I agree with the above.  I have seen and evaluated the patient.  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  He states that he has not had any new symptoms since last Saturday.  He is currently on dual antiplatelet therapy.  He still has residual weakness and fine motor function loss in the left arm and leg.  I discussed proceeding with right carotid endarterectomy.  The risks and benefits of the procedure were discussed with the patient including the risk of stroke, nerve injury, numbness, bleeding, and cardiopulmonary complications.  All of his questions were answered.  He has tentatively been placed on the operating room schedule for Friday.  Durene Cal

## 2017-11-21 NOTE — Evaluation (Signed)
Physical Therapy Evaluation Patient Details Name: Nathan Hernandez MRN: [id number] DOB: 1960-10-09 Today's Date: 11/21/2017   History of Present Illness  Patient is a 57 y/o male admitted on 11/20/17 due to increased N&T at L UE/LE, as well as gait instability and balance deficits. CT of the head revealed chronic appearing lacunar infarcts in the bilateral basal ganglia and right pons.  MRI/MRA brain revealed multiple small infarcts throughout the right cerebral hemisphere primarily in a watershed distribution and varying in age from acute to late subacute/chronic.  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 Patient with a PMH significant for dyslipidemia, Hypertension, tobacco abuse, daily alcohol and marijuana use.  Patient also has a history of osteoarthritis and has chronic left knee pain.  Clinical Impression  [name] admitted with the above listed diagnosis. Prior to admission, patient reports he lived with his mother, and provided intermittent assist for her, and was independent with mobility with SPC. Patient today with noted weakness of L UE/LE, balance deficits, gait instability - all requiring Min Guard for safety with mobility. PT recommending outpatient PT at this time to progress functional mobility and balance. PT to continue to follow acutely.     Follow Up Recommendations Outpatient PT;Supervision - Intermittent    Equipment Recommendations  None recommended by PT    Recommendations for Other Services OT consult     Precautions / Restrictions Precautions Precautions: Fall Restrictions Weight Bearing Restrictions: No      Mobility  Bed Mobility Overal bed mobility: Needs Assistance Bed Mobility: Supine to Sit     Supine to sit: Supervision     General bed mobility comments: for line management  Transfers Overall transfer level: Needs assistance Equipment used: Straight cane Transfers: Sit to/from Stand Sit to  Stand: Min guard         General transfer comment: for safety  Ambulation/Gait Ambulation/Gait assistance: Min guard Gait Distance (Feet): 150 Feet Assistive device: Straight cane Gait Pattern/deviations: Step-through pattern;Decreased stride length;Drifts right/left;Narrow base of support     General Gait Details: mild instability  Stairs            Wheelchair Mobility    Modified Rankin (Stroke Patients Only) Modified Rankin (Stroke Patients Only) Pre-Morbid Rankin Score: No significant disability Modified Rankin: Moderately severe disability     Balance Overall balance assessment: Needs assistance Sitting-balance support: No upper extremity supported;Feet supported Sitting balance-Leahy Scale: Good     Standing balance support: Single extremity supported;During functional activity Standing balance-Leahy Scale: Fair                               Pertinent Vitals/Pain Pain Assessment: No/denies pain    Home Living Family/patient expects to be discharged to:: Private residence Living Arrangements: Parent Available Help at Discharge: Family;Available PRN/intermittently Type of Home: House Home Access: Stairs to enter;Ramped entrance   Entrance Stairs-Number of Steps: 5 Home Layout: One level Home Equipment: Cane - single point      Prior Function Level of Independence: Independent with assistive device(s)               Hand Dominance        Extremity/Trunk Assessment   Upper Extremity Assessment Upper Extremity Assessment: Defer to OT evaluation    Lower Extremity Assessment Lower Extremity Assessment: Generalized weakness;LLE deficits/detail LLE Deficits / Details: L LE grossly 4/5 - patient feels as if L knee pain is  most limiting factor       Communication   Communication: No difficulties  Cognition Arousal/Alertness: Awake/alert Behavior During Therapy: WFL for tasks assessed/performed Overall Cognitive Status:  Within Functional Limits for tasks assessed                                        General Comments      Exercises     Assessment/Plan    PT Assessment Patient needs continued PT services  PT Problem List Decreased strength;Decreased activity tolerance;Decreased balance;Decreased mobility;Decreased coordination;Decreased knowledge of use of DME;Decreased safety awareness;Decreased knowledge of precautions;Impaired sensation       PT Treatment Interventions DME instruction;Gait training;Stair training;Functional mobility training;Therapeutic activities;Therapeutic exercise;Balance training;Neuromuscular re-education;Patient/family education    PT Goals (Current goals can be found in the Care Plan section)  Acute Rehab PT Goals Patient Stated Goal: none stated PT Goal Formulation: With patient Time For Goal Achievement: 12/05/17 Potential to Achieve Goals: Good    Frequency Min 4X/week   Barriers to discharge        Co-evaluation               AM-PAC PT "6 Clicks" Daily Activity  Outcome Measure Difficulty turning over in bed (including adjusting bedclothes, sheets and blankets)?: A Little Difficulty moving from lying on back to sitting on the side of the bed? : A Little Difficulty sitting down on and standing up from a chair with arms (e.g., wheelchair, bedside commode, etc,.)?: Unable Help needed moving to and from a bed to chair (including a wheelchair)?: A Little Help needed walking in hospital room?: A Little Help needed climbing 3-5 steps with a railing? : A Little 6 Click Score: 16    End of Session Equipment Utilized During Treatment: Gait belt Activity Tolerance: Patient tolerated treatment well Patient left: in bed;with call bell/phone within reach;with family/visitor present Nurse Communication: Mobility status PT Visit Diagnosis: Unsteadiness on feet (R26.81);Other abnormalities of gait and mobility (R26.89);Muscle weakness  (generalized) (M62.81);Hemiplegia and hemiparesis Hemiplegia - Right/Left: Left Hemiplegia - dominant/non-dominant: Non-dominant Hemiplegia - caused by: Cerebral infarction    Time: 1610-9604 PT Time Calculation (min) (ACUTE ONLY): 15 min   Charges:   PT Evaluation $PT Eval Moderate Complexity: 1 Mod     PT G Codes:        Kipp Laurence, PT, DPT 11/21/17 1:05 PM

## 2017-11-21 NOTE — Progress Notes (Signed)
OT Cancellation Note  Patient Details Name: Gerline LegacyMichael Welke MRN: [id number] DOB: 22-Feb-1961   Cancelled Treatment:    Reason Eval/Treat Not Completed: Patient at procedure or test/ unavailable. Pt being transported to Echo. Plan to reattempt.   Raynald KempKathryn Al Bracewell OTR/L Pager: [phone]  11/21/2017, 9:46 AM

## 2017-11-21 NOTE — Evaluation (Signed)
Occupational Therapy Evaluation Patient Details Name: Nathan LegacyMichael Schnoebelen MRN: [id number] DOB: [birth year] Today's Date: 11/21/2017    History of Present Illness Patient is a 57 y/o male admitted on 11/20/17 due to increased N&T at L UE/LE, as well as gait instability and balance deficits. CT of the head revealed chronic appearing lacunar infarcts in the bilateral basal ganglia and right pons.  MRI/MRA brain revealed multiple small infarcts throughout the right cerebral hemisphere primarily in a watershed distribution and varying in age from acute to late subacute/chronic.  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 Patient with a PMH significant for dyslipidemia, Hypertension, tobacco abuse, daily alcohol and marijuana use.  Patient also has a history of osteoarthritis and has chronic left knee pain.   Clinical Impression   Pt admitted with the above diagnoses and presents with below problem list. Pt will benefit from continued acute OT to address the below listed deficits and maximize independence with basic ADLs prior to d/c home with continued therapy recommended. PTA pt was mod I with ADLs. Pt is currently mostly setup to min guard with OOB/LB ADLs, mod A for grooming and UB dressing. Pt presents with LUE weakness and impaired coordination. L shoulder pain at about 90* shoulder flexion which pt reports is chronic from fall sustained in December 2018.      Follow Up Recommendations  Home health OT;Supervision - Intermittent    Equipment Recommendations  None recommended by OT    Recommendations for Other Services       Precautions / Restrictions Precautions Precautions: Fall Restrictions Weight Bearing Restrictions: No      Mobility Bed Mobility Overal bed mobility: Needs Assistance Bed Mobility: Supine to Sit     Supine to sit: Supervision     General bed mobility comments: for line management  Transfers Overall transfer level:  Needs assistance Equipment used: Straight cane Transfers: Sit to/from Stand Sit to Stand: Min guard         General transfer comment: for safety    Balance Overall balance assessment: Needs assistance Sitting-balance support: No upper extremity supported;Feet supported Sitting balance-Leahy Scale: Good     Standing balance support: Single extremity supported;During functional activity Standing balance-Leahy Scale: Fair                             ADL either performed or assessed with clinical judgement   ADL Overall ADL's : Needs assistance/impaired Eating/Feeding: Set up;Sitting   Grooming: moderate assistance;Standing;Min guard   Upper Body Bathing: Set up;Sitting   Lower Body Bathing: Minimal assistance;Min guard;Sit to/from stand Lower Body Bathing Details (indicate cue type and reason): assist for fasteners and on left side. Upper Body Dressing : Set up;moderate assistance;Sitting   Lower Body Dressing: Min guard;Minimal assistance;Sit to/from stand   Toilet Transfer: Min guard;Ambulation(cane)   Toileting- Clothing Manipulation and Hygiene: Min guard;Minimal assistance;Sit to/from stand Toileting - Clothing Manipulation Details (indicate cue type and reason): assist with fasterners Tub/ Shower Transfer: Min guard;Ambulation     General ADL Comments: Pt with LUE deficits impacting ADLs. Pt recently observed ambulating in the hall with PT.      Vision Baseline Vision/History: Wears glasses Patient Visual Report: No change from baseline       Perception     Praxis      Pertinent Vitals/Pain Pain Assessment: No/denies pain     Hand Dominance Right   Extremity/Trunk Assessment Upper Extremity Assessment  Upper Extremity Assessment: LUE deficits/detail LUE Deficits / Details: 3/5 MMT, pain in shoulder at about 90* shoulder flexion. Pt reports falling on that side December 2018. Impaired coordination of LUE. LUE Coordination: decreased fine  motor;decreased gross motor   Lower Extremity Assessment Lower Extremity Assessment: Defer to PT evaluation LLE Deficits / Details: L LE grossly 4/5 - patient feels as if L knee pain is most limiting factor       Communication Communication Communication: No difficulties   Cognition Arousal/Alertness: Awake/alert Behavior During Therapy: WFL for tasks assessed/performed Overall Cognitive Status: Within Functional Limits for tasks assessed                                     General Comments       Exercises Exercises: Other exercises Other Exercises Other Exercises: Provided FMC exercises and therapy squeeze ball for L hand. Instructed in use.    Shoulder Instructions      Home Living Family/patient expects to be discharged to:: Private residence Living Arrangements: Parent Available Help at Discharge: Family;Available PRN/intermittently Type of Home: House Home Access: Stairs to enter;Ramped entrance Entrance Stairs-Number of Steps: 5   Home Layout: One level     Bathroom Shower/Tub: Tub/shower unit         Home Equipment: Cane - single point          Prior Functioning/Environment Level of Independence: Independent with assistive device(s)                 OT Problem List: Decreased strength;Decreased range of motion;Impaired balance (sitting and/or standing);Decreased coordination;Decreased knowledge of use of DME or AE;Decreased knowledge of precautions;Impaired tone;Impaired UE functional use      OT Treatment/Interventions: Self-care/ADL training;Therapeutic exercise;Neuromuscular education;DME and/or AE instruction;Therapeutic activities;Patient/family education;Balance training    OT Goals(Current goals can be found in the care plan section) Acute Rehab OT Goals Patient Stated Goal: none stated OT Goal Formulation: With patient Time For Goal Achievement: 12/05/17 Potential to Achieve Goals: Good ADL Goals Pt Will Perform  Grooming: with modified independence;sitting Pt Will Perform Upper Body Dressing: with modified independence;sitting Pt Will Perform Lower Body Dressing: with modified independence;sit to/from stand Pt Will Perform Tub/Shower Transfer: with modified independence;ambulating Pt/caregiver will Perform Home Exercise Program: Left upper extremity;Independently;With written HEP provided  OT Frequency: Min 2X/week   Barriers to D/C:            Co-evaluation              AM-PAC PT "6 Clicks" Daily Activity     Outcome Measure Help from another person eating meals?: None Help from another person taking care of personal grooming?: A Little Help from another person toileting, which includes using toliet, bedpan, or urinal?: A Little Help from another person bathing (including washing, rinsing, drying)?: A Little Help from another person to put on and taking off regular upper body clothing?: A Little Help from another person to put on and taking off regular lower body clothing?: None 6 Click Score: 20   End of Session Equipment Utilized During Treatment: Other (comment)(cane)  Activity Tolerance: Patient tolerated treatment well Patient left: in bed;with call bell/phone within reach;with family/visitor present  OT Visit Diagnosis: Other abnormalities of gait and mobility (R26.89);History of falling (Z91.81);Hemiplegia and hemiparesis Hemiplegia - Right/Left: Left Hemiplegia - dominant/non-dominant: Non-Dominant Hemiplegia - caused by: Cerebral infarction  Time: 1610-9604 OT Time Calculation (min): 22 min Charges:  OT General Charges $OT Visit: 1 Visit OT Evaluation $OT Eval Low Complexity: 1 Low G-Codes:       Pilar Grammes 11/21/2017, 1:35 PM

## 2017-11-21 NOTE — Evaluation (Signed)
Speech Language Pathology Evaluation Patient Details Name: Nathan Hernandez MRN: [id number] DOB: 15-Aug-1960 Today's Date: 11/21/2017 Time: 1350-1400 SLP Time Calculation (min) (ACUTE ONLY): 10 min  Problem List:  Patient Active Problem List   Diagnosis Date Noted  . Acute bilat watershed infarction [facility](HCC) 11/20/2017  . Dyslipidemia 11/20/2017  . Hypertension 11/20/2017  . Polysubstance abuse (HCC) 11/20/2017  . HLD (hyperlipidemia) 05/20/2014  . Osteoarthritis of right knee 11/27/2013  . Establishing care with new doctor, encounter for 04/05/2013  . Poor vision 04/05/2013  . High blood pressure 04/05/2013  . Knee pain, acute 04/05/2013   Past Medical History:  Past Medical History:  Diagnosis Date  . HLD (hyperlipidemia)   . Hypertension   . Osteoarthritis   . Polysubstance abuse [facility](HCC)    Past Surgical History:  Past Surgical History:  Procedure Laterality Date  . KNEE SURGERY     HPI:  Patient is a 57 y/o male admitted on 11/20/17 due to increased N&T at L UE/LE, as well as gait instability and balance deficits. CT of the head revealed chronic appearing lacunar infarcts in the bilateral basal ganglia and right pons.  MRI/MRA brain revealed multiple small infarcts throughout the right cerebral hemisphere primarily in a watershed distribution and varying in age from acute to late subacute/chronic.  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 Patient with a PMH significant for dyslipidemia, Hypertension, tobacco abuse, daily alcohol and marijuana use.  Patient also has a history of osteoarthritis and has chronic left knee pain.   Assessment / Plan / Recommendation Clinical Impression   Pt presents with grossly intact cognitive-linguistic function.  Pt scored 29/30 on the Mini Mental State Exam, his speech was fluent and free from dysarthria or word finding impairment.  Both pt and son endorse that pt is back to baseline in regards to  speech, language, and cognition.  As a result, no further ST needs are indicated at this time.      SLP Assessment  SLP Recommendation/Assessment: Patient does not need any further Speech Lanaguage Pathology Services    Follow Up Recommendations  None          SLP Evaluation Cognition  Overall Cognitive Status: Within Functional Limits for tasks assessed Orientation Level: Oriented X4       Comprehension  Auditory Comprehension Overall Auditory Comprehension: Appears within functional limits for tasks assessed    Expression Expression Primary Mode of Expression: Verbal Verbal Expression Overall Verbal Expression: Appears within functional limits for tasks assessed Written Expression Dominant Hand: Right   Oral / Motor  Oral Motor/Sensory Function Overall Oral Motor/Sensory Function: Within functional limits Motor Speech Overall Motor Speech: Appears within functional limits for tasks assessed   GO                    Maryjane HurterPage, Drucilla Cumber L 11/21/2017, 2:03 PM

## 2017-11-21 NOTE — Progress Notes (Addendum)
STROKE TEAM PROGRESS NOTE  HPI ( Dr Aroor ) Denarius Sesler is an 57 y.o. male  With PMH significant for HLD, presents to [facility] for 6 month history of gradual left side weakness. Per patient this weekend his left side became weaker than normal on Saturday, but he refused to come to the hospital so they convinced him to come today. Over the past 6 months patient states that he has had numbness, and tingling on the left side, but none today. 6 months ago when he first noticed the weakness he states that it came on suddenly, but he thought it was related to his knee and did not think that it was a stroke. He denies any facial droop or slurring of words with this first episode. He walks with a cane at baseline due to left knee pain and bilateral OA of the knees.  Today he is having trouble gripping things with his left hand. Patient states that he has fallen in the past 6 months due to this weakness. Denies ever having any visual problems, n/v, trouble speaking or slurred speech. Patient  Smoked 1/2 pack of cigarettes per day for several years, but quit smoking cigarettes for the past 4-5 years, but does smoke 2-3 black and milds (Cigars)/ day, drinks 2-3 beers per day, and does smoke marijuana. Denies any other drug use. Patient also denies a history of HTN. He is not currently on any medications.   Last known normal:  6 months ago  Time: unknown  ED course:  CT Head showed no hemorrhage. 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 MRA of head shows  Abnormal distal cervical and proximal intracranial portions of right ICA with mild diffuse narrowing.   Last BP was: 144/98  NIHSS: 1 Rankin: 1     INTERVAL HISTORY His son is at the bedside.  PT in the room - pt walked in hall.  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  Vitals:   11/21/17 0920  11/21/17 0921 11/21/17 1111 11/21/17 1112  BP: (!) 155/92   137/89  Pulse:   (!) 50 (!) 55  Resp:  16 12 18   Temp:  98 F (36.7 C) (!) 97.5 F (36.4 C)   TempSrc:  Oral Oral   SpO2: 98% 99% 99% 95%  Weight:      Height:        CBC:  Recent Labs  Lab 11/20/17 1143  WBC 5.4  NEUTROABS 1.9  HGB 15.9  HCT 48.8  MCV 87.3  PLT 311    Basic Metabolic Panel:  Recent Labs  Lab 11/20/17 1143  NA 140  K 3.9  CL 105  CO2 25  GLUCOSE 96  BUN <5*  CREATININE 0.73  CALCIUM 9.3   Lipid Panel:     Component Value Date/Time   CHOL 161 11/21/2017 0529   TRIG 76 11/21/2017 0529   HDL 57 11/21/2017 0529   CHOLHDL 2.8 11/21/2017 0529   VLDL 15 11/21/2017 0529   LDLCALC 89 11/21/2017 0529   HgbA1c:  Lab Results  Component Value Date   HGBA1C 5.6 11/21/2017   Urine Drug Screen:     Component Value Date/Time   LABOPIA NONE DETECTED 11/20/2017 1915   COCAINSCRNUR NONE DETECTED 11/20/2017 1915   LABBENZ NONE DETECTED 11/20/2017 1915   AMPHETMU NONE DETECTED 11/20/2017 1915   THCU POSITIVE (A) 11/20/2017 1915  LABBARB (A) 11/20/2017 1915    Result not available. Reagent lot number recalled by manufacturer.    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 EXAM: CT ANGIOGRAPHY HEAD AND NECK TECHNIQUE: Multidetector CT imaging of the head and neck was performed using the standard protocol during bolus administration of intravenous contrast. Multiplanar CT image reconstructions and MIPs were obtained to evaluate the vascular anatomy. Carotid stenosis measurements (when applicable) are obtained utilizing NASCET criteria, using the distal internal carotid diameter as the denominator. CONTRAST:  50mL ISOVUE-370 IOPAMIDOL (ISOVUE-370) INJECTION 76%  COMPARISON:  Prior head CT and MRI from earlier the same day. FINDINGS: CTA NECK FINDINGS Aortic arch: Visualized aortic arch of normal caliber with normal branch pattern. Mild aortic atherosclerosis without hemodynamically significant stenosis about the origin of the great vessels. Visualized subclavian arteries widely patent. Right carotid system: Right common carotid artery patent from its origin to the bifurcation without hemodynamically significant stenosis. Mixed atheromatous plaque at the proximal right ICA with severe near occlusive stenosis (series 7, image 191). A string sign is faintly visible on axial sequences. Stenosis begins approximately 4 mm distal to the bifurcation and measures approximately 1 cm in length. Right ICA widely patent distally to the skull base without additional stenosis or occlusion. Left carotid system: Scattered atheromatous plaque within the left common carotid artery without hemodynamically significant stenosis. A centric calcified plaque about the left bifurcation without significant stenosis. Left ICA patent distally to the skull base without stenosis, dissection, or occlusion. Vertebral arteries: Both of the vertebral arteries arise from the subclavian arteries. Focal plaque at the origin of the left vertebral artery with approximate 50% stenosis. Vertebral arteries otherwise widely patent within the neck. Skeleton: No acute osseous abnormality. No worrisome lytic or blastic osseous lesions. Multilevel degenerative disc disease and facet arthritis throughout the cervical spine, better evaluated on prior MRI. Other neck: Unremarkable. Upper chest: Unremarkable. Review of the MIP images confirms the above findings CTA HEAD FINDINGS Anterior circulation: Eccentric calcified plaque within the horizontal petrous right ICA with mild stenosis. Fairly heavy atheromatous burden throughout the cavernous/supraclinoid right ICA with moderate to advanced atheromatous stenosis of up to  approximately 75%. Right ICA terminus widely patent. On the left, petrous left ICA widely patent without stenosis. Atheromatous plaque throughout the left carotid siphon with moderate multifocal narrowing of up to approximately 50%. Left ICA terminus widely patent. A1 segments mildly irregular but patent bilaterally. Right A1 hypoplastic. Normal anterior communicating artery. Anterior cerebral arteries patent to their distal aspects without stenosis. No M1 stenosis or occlusion. No proximal M2 occlusion distal MCA branches patent bilaterally, although overall the right MCA and ACA are attenuated as compared to the left due to the proximal right ICA stenosis. Posterior circulation: Atheromatous irregularity within the distal right V4 segment without high-grade stenosis. Left V4 widely patent to the vertebrobasilar junction. Posterior inferior cerebral arteries patent bilaterally. Multifocal atheromatous irregularity throughout the basilar artery. Moderate stenoses of up to 50% seen involving the proximal and mid basilar artery. Superior cerebral arteries patent bilaterally. Left PCA supplied via the basilar. Fetal type origin of the right PCA. PCAs patent to their distal aspects without high-grade stenosis. Venous sinuses: Grossly patent, although not well assessed due to arterial timing of the contrast  bolus. Anatomic variants: Fetal type origin of the right PCA. No intracranial aneurysm. Delayed phase: Not performed. Review of the MIP images confirms the above findings IMPRESSION: 1. 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 Secondary downstream attenuation with decreased flow throughout the right ACA and MCA territories as compared to the left. 2. Prominent carotid siphon atherosclerotic change with associated stenoses of up to 75% on the right and 50% on the left. 3. Prominent atherosclerotic change  throughout the proximal and mid basilar artery with up to 50% stenoses. 4. 50% stenosis at the origin of the left vertebral artery. 5. Fetal type right PCA. Electronically Signed   By: Rise Mu M.D.   On: 11/20/2017 20:05   Ct Head Wo Contrast  Result Date: 11/20/2017 CLINICAL DATA:  Left-sided weakness the past few months. Numbness and tingling of the extremities. EXAM: CT HEAD WITHOUT CONTRAST TECHNIQUE: Contiguous axial images were obtained from the base of the skull through the vertex without intravenous contrast. COMPARISON:  CT head report dated January 26, 1999. FINDINGS: Brain: Focal loss of the normal gray-white matter differentiation in the right pre and postcentral gyri. No evidence of acute hemorrhage, hydrocephalus, extra-axial collection or mass lesion/mass effect. Old lacunar infarcts in the bilateral basal ganglia and right pons. Vascular: Atherosclerotic vascular calcification of the carotid siphons. No hyperdense vessel. Skull: Normal. Negative for fracture or focal lesion. Sinuses/Orbits: No acute finding. Other: None. IMPRESSION: 1. Loss of the normal gray-white matter differentiation in the right pre and postcentral gyri, suspicious for subacute infarct given clinical history. 2. Chronic appearing lacunar infarcts in the bilateral basal ganglia and right pons. Electronically Signed   By: Obie Dredge M.D.   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 EXAM: CT ANGIOGRAPHY HEAD AND NECK TECHNIQUE: Multidetector CT imaging of the head and neck was performed using the standard protocol during bolus administration of intravenous contrast. Multiplanar CT image reconstructions and MIPs were obtained to evaluate the vascular anatomy. Carotid stenosis measurements (when applicable) are obtained utilizing NASCET  criteria, using the distal internal carotid diameter as the denominator. CONTRAST:  50mL ISOVUE-370 IOPAMIDOL (ISOVUE-370) INJECTION 76% COMPARISON:  Prior head CT and MRI from earlier the same day. FINDINGS: CTA NECK FINDINGS Aortic arch: Visualized aortic arch of normal caliber with normal branch pattern. Mild aortic atherosclerosis without hemodynamically significant stenosis about the origin of the great vessels. Visualized subclavian arteries widely patent. Right carotid system: Right common carotid artery patent from its origin to the bifurcation without hemodynamically significant stenosis. Mixed atheromatous plaque at the proximal right ICA with severe near occlusive stenosis (series 7, image 191). A string sign is faintly visible on axial sequences. Stenosis begins approximately 4 mm distal to the bifurcation and measures approximately 1 cm in length. Right ICA widely patent distally to the skull base without additional stenosis or occlusion. Left carotid system: Scattered atheromatous plaque within the left common carotid artery without hemodynamically significant stenosis. A centric calcified plaque about the left bifurcation without significant stenosis. Left ICA patent distally to the skull base without stenosis, dissection, or occlusion. Vertebral arteries: Both of the vertebral arteries arise from the subclavian arteries. Focal plaque at the origin of the left vertebral artery with approximate 50% stenosis. Vertebral arteries otherwise  widely patent within the neck. Skeleton: No acute osseous abnormality. No worrisome lytic or blastic osseous lesions. Multilevel degenerative disc disease and facet arthritis throughout the cervical spine, better evaluated on prior MRI. Other neck: Unremarkable. Upper chest: Unremarkable. Review of the MIP images confirms the above findings CTA HEAD FINDINGS Anterior circulation: Eccentric calcified plaque within the horizontal petrous right ICA with mild stenosis.  Fairly heavy atheromatous burden throughout the cavernous/supraclinoid right ICA with moderate to advanced atheromatous stenosis of up to approximately 75%. Right ICA terminus widely patent. On the left, petrous left ICA widely patent without stenosis. Atheromatous plaque throughout the left carotid siphon with moderate multifocal narrowing of up to approximately 50%. Left ICA terminus widely patent. A1 segments mildly irregular but patent bilaterally. Right A1 hypoplastic. Normal anterior communicating artery. Anterior cerebral arteries patent to their distal aspects without stenosis. No M1 stenosis or occlusion. No proximal M2 occlusion distal MCA branches patent bilaterally, although overall the right MCA and ACA are attenuated as compared to the left due to the proximal right ICA stenosis. Posterior circulation: Atheromatous irregularity within the distal right V4 segment without high-grade stenosis. Left V4 widely patent to the vertebrobasilar junction. Posterior inferior cerebral arteries patent bilaterally. Multifocal atheromatous irregularity throughout the basilar artery. Moderate stenoses of up to 50% seen involving the proximal and mid basilar artery. Superior cerebral arteries patent bilaterally. Left PCA supplied via the basilar. Fetal type origin of the right PCA. PCAs patent to their distal aspects without high-grade stenosis. Venous sinuses: Grossly patent, although not well assessed due to arterial timing of the contrast bolus. Anatomic variants: Fetal type origin of the right PCA. No intracranial aneurysm. Delayed phase: Not performed. Review of the MIP images confirms the above findings IMPRESSION: 1. 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 Secondary downstream attenuation with decreased flow throughout the right ACA and MCA territories as compared to the left. 2. Prominent carotid siphon  atherosclerotic change with associated stenoses of up to 75% on the right and 50% on the left. 3. 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 4. 50% stenosis at the origin of the left vertebral artery. 5. Fetal type right PCA. Electronically Signed   By: Rise Mu M.D.   On: 11/20/2017 20:05   Mr Maxine Glenn Head Wo Contrast  Result Date: 11/20/2017 CLINICAL DATA:  Left-sided weakness for several months. Numbness and tingling in the extremities. EXAM: MRI HEAD WITHOUT CONTRAST MRA HEAD WITHOUT CONTRAST MRI CERVICAL SPINE WITHOUT CONTRAST TECHNIQUE: Multiplanar, multiecho pulse sequences of the brain and surrounding structures, and cervical spine, to include the craniocervical junction and cervicothoracic junction, were obtained without intravenous contrast. Angiographic images of the head were obtained using MRA technique without contrast. COMPARISON:  Head CT 11/20/2017 FINDINGS: MRI HEAD FINDINGS Brain: Small scattered acute to early subacute cortical and white matter infarcts are present in the right cerebral hemisphere which appear to largely be along superficial and deep watershed zones. The largest confluent area of infarction involves 2 cm of right frontoparietal cortex at the vertex, corresponding to the abnormality described on CT. Multiple sagittally oriented infarcts are present in the right centrum semiovale. Small right parieto-occipital cortical infarcts are noted in the posterior MCA/watershed territory. There are a couple of punctate acute right temporal lobe cortical infarcts, and there is a small acute infarct involving the right basal ganglia and anterior limb of internal capsule.  No acute left cerebral hemispheric or posterior fossa infarcts are identified. Chronic microhemorrhages are noted in the posterior left frontal lobe at the level of the corona radiata and in the right cerebellum. There are small cortical infarcts in the  posterior right temporal lobe and lateral right occipital lobe as well as small white matter infarcts in the right centrum semiovale which have a more late subacute to chronic appearance. Chronic lacunar infarcts are also noted in the basal ganglia bilaterally and in the right pons. The ventricles are normal in size. No mass, midline shift, or extra-axial fluid collection is present. Vascular: Abnormal appearance of the distal right internal carotid artery with some is centric. Skull and upper cervical spine: Unremarkable bone marrow signal. Sinuses/Orbits: Unremarkable orbits. Minimal mucosal thickening in the frontal, ethmoid, and right sphenoid sinuses. Clear mastoid air cells. Other: None. MRA HEAD FINDINGS The visualized distal vertebral arteries are patent to the basilar and codominant. There is mild left V4 stenosis at the vertebrobasilar junction. Patent PICA, AICA, and SCA origins are visualized bilaterally. The basilar artery is patent with motion artifact through its proximal portion and no evidence of significant stenosis. There is a predominantly fetal type origin of the right PCA, possibly with a diminutive and diseased right P1 segment. The large right posterior communicating artery is widely patent, as is the left PCA. The included distal cervical and intracranial segments of both internal carotid arteries are patent to the carotid termini. However, the right ICA is diffusely small in caliber compared to the left. There is abnormal signal diffusely surrounding the right ICA as it enters the skull base as well as throughout the petrous portion. Flow related enhancement is mildly diminished throughout the distal right petrous ICA extending to the petrous-cavernous junction, and there is superimposed mild additional narrowing throughout this region. The ACAs and MCAs are patent without evidence of proximal branch occlusion or significant proximal stenosis, although the right ACA and right MCA appears  slightly attenuated with less robust flow diffusely compared to the contralateral side. No aneurysm is identified. MRI CERVICAL SPINE FINDINGS The study is mildly motion degraded. Alignment: Cervical spine straightening.  No significant listhesis. Vertebrae: No fracture, suspicious osseous lesion, or significant marrow edema. Type 2 degenerative endplate changes from C5-T1. Cord: Normal signal and morphology. Posterior Fossa, vertebral arteries, paraspinal tissues: Preserved vertebral artery flow voids. Abnormal appearance of the proximal right ICA flow void. Unremarkable paraspinal soft tissues. Disc levels: C2-3: Asymmetric right facet arthrosis with likely ankylosis across the joint. Mild uncovertebral spurring. Mild right neural foraminal stenosis. No spinal stenosis. C3-4: Moderate disc space narrowing. Disc bulging, uncovertebral spurring, and mild facet arthrosis result in mild spinal stenosis and moderate to severe bilateral neural foraminal stenosis. C4-5: Mild disc bulging, mild uncovertebral spurring, and mild facet arthrosis result in mild-to-moderate right and moderate left neural foraminal stenosis and borderline spinal stenosis. C5-6: Right greater than left uncovertebral hypertrophy results in severe right and moderate to severe left neural foraminal stenosis. Minimal disc bulging does not result in significant spinal stenosis. C6-7: Uncovertebral spurring results in moderate to severe bilateral neural foraminal stenosis. No spinal stenosis. C7-T1: Uncovertebral spurring and mild disc bulging result in moderate to severe bilateral neural foraminal stenosis without spinal stenosis. T1-2: Bilateral foraminal disc protrusions result in moderate bilateral neural foraminal stenosis without spinal stenosis. IMPRESSION: 1. 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 2. Chronic bilateral basal  ganglia  and pontine lacunar infarcts. 3. Diffusely abnormal appearance of the distal cervical and proximal intracranial portions of the right ICA including mild diffuse narrowing. Head and neck CTA is recommended to further evaluate for a possible dissection or flow limiting proximal stenosis. 4. Diffuse cervical spondylosis with moderate to severe multilevel neural foraminal stenosis as above. 5. Mild spinal stenosis at C3-4. Electronically Signed   By: Sebastian Ache M.D.   On: 11/20/2017 16:21   Mr Brain Wo Contrast  Result Date: 11/20/2017 CLINICAL DATA:  Left-sided weakness for several months. Numbness and tingling in the extremities. EXAM: MRI HEAD WITHOUT CONTRAST MRA HEAD WITHOUT CONTRAST MRI CERVICAL SPINE WITHOUT CONTRAST TECHNIQUE: Multiplanar, multiecho pulse sequences of the brain and surrounding structures, and cervical spine, to include the craniocervical junction and cervicothoracic junction, were obtained without intravenous contrast. Angiographic images of the head were obtained using MRA technique without contrast. COMPARISON:  Head CT 11/20/2017 FINDINGS: MRI HEAD FINDINGS Brain: Small scattered acute to early subacute cortical and white matter infarcts are present in the right cerebral hemisphere which appear to largely be along superficial and deep watershed zones. The largest confluent area of infarction involves 2 cm of right frontoparietal cortex at the vertex, corresponding to the abnormality described on CT. Multiple sagittally oriented infarcts are present in the right centrum semiovale. Small right parieto-occipital cortical infarcts are noted in the posterior MCA/watershed territory. There are a couple of punctate acute right temporal lobe cortical infarcts, and there is a small acute infarct involving the right basal ganglia and anterior limb of internal capsule. No acute left cerebral hemispheric or posterior fossa infarcts are identified. Chronic microhemorrhages are noted in the  posterior left frontal lobe at the level of the corona radiata and in the right cerebellum. There are small cortical infarcts in the posterior right temporal lobe and lateral right occipital lobe as well as small white matter infarcts in the right centrum semiovale which have a more late subacute to chronic appearance. Chronic lacunar infarcts are also noted in the basal ganglia bilaterally and in the right pons. The ventricles are normal in size. No mass, midline shift, or extra-axial fluid collection is present. Vascular: Abnormal appearance of the distal right internal carotid artery with some is centric. Skull and upper cervical spine: Unremarkable bone marrow signal. Sinuses/Orbits: Unremarkable orbits. Minimal mucosal thickening in the frontal, ethmoid, and right sphenoid sinuses. Clear mastoid air cells. Other: None. MRA HEAD FINDINGS The visualized distal vertebral arteries are patent to the basilar and codominant. There is mild left V4 stenosis at the vertebrobasilar junction. Patent PICA, AICA, and SCA origins are visualized bilaterally. The basilar artery is patent with motion artifact through its proximal portion and no evidence of significant stenosis. There is a predominantly fetal type origin of the right PCA, possibly with a diminutive and diseased right P1 segment. The large right posterior communicating artery is widely patent, as is the left PCA. The included distal cervical and intracranial segments of both internal carotid arteries are patent to the carotid termini. However, the right ICA is diffusely small in caliber compared to the left. There is abnormal signal diffusely surrounding the right ICA as it enters the skull base as well as throughout the petrous portion. Flow related enhancement is mildly diminished throughout the distal right petrous ICA extending to the petrous-cavernous junction, and there is superimposed mild additional narrowing throughout this region. The ACAs and MCAs are  patent without evidence of proximal branch occlusion or significant proximal stenosis, although the right  ACA and right MCA appears slightly attenuated with less robust flow diffusely compared to the contralateral side. No aneurysm is identified. MRI CERVICAL SPINE FINDINGS The study is mildly motion degraded. Alignment: Cervical spine straightening.  No significant listhesis. Vertebrae: No fracture, suspicious osseous lesion, or significant marrow edema. Type 2 degenerative endplate changes from C5-T1. Cord: Normal signal and morphology. Posterior Fossa, vertebral arteries, paraspinal tissues: Preserved vertebral artery flow voids. Abnormal appearance of the proximal right ICA flow void. Unremarkable paraspinal soft tissues. Disc levels: C2-3: Asymmetric right facet arthrosis with likely ankylosis across the joint. Mild uncovertebral spurring. Mild right neural foraminal stenosis. No spinal stenosis. C3-4: Moderate disc space narrowing. Disc bulging, uncovertebral spurring, and mild facet arthrosis result in mild spinal stenosis and moderate to severe bilateral neural foraminal stenosis. C4-5: Mild disc bulging, mild uncovertebral spurring, and mild facet arthrosis result in mild-to-moderate right and moderate left neural foraminal stenosis and borderline spinal stenosis. C5-6: Right greater than left uncovertebral hypertrophy results in severe right and moderate to severe left neural foraminal stenosis. Minimal disc bulging does not result in significant spinal stenosis. C6-7: Uncovertebral spurring results in moderate to severe bilateral neural foraminal stenosis. No spinal stenosis. C7-T1: Uncovertebral spurring and mild disc bulging result in moderate to severe bilateral neural foraminal stenosis without spinal stenosis. T1-2: Bilateral foraminal disc protrusions result in moderate bilateral neural foraminal stenosis without spinal stenosis. IMPRESSION: 1. Multiple small infarcts throughout the right cerebral  hemisphere primarily in a watershed distribution and varying in age from acute to late subacute/chronic. 2. 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 3. Diffusely abnormal appearance of the distal cervical and proximal intracranial portions of the right ICA including mild diffuse narrowing. Head and neck CTA is recommended to further evaluate for a possible dissection or flow limiting proximal stenosis. 4. Diffuse cervical spondylosis with moderate to severe multilevel neural foraminal stenosis as above. 5. Mild spinal stenosis at C3-4. Electronically Signed   By: Sebastian Ache M.D.   On: 11/20/2017 16:21   Mr Cervical Spine Wo Contrast  Result Date: 11/20/2017 CLINICAL DATA:  Left-sided weakness for several months. Numbness and tingling in the extremities. EXAM: MRI HEAD WITHOUT CONTRAST MRA HEAD WITHOUT CONTRAST MRI CERVICAL SPINE WITHOUT CONTRAST TECHNIQUE: Multiplanar, multiecho pulse sequences of the brain and surrounding structures, and cervical spine, to include the craniocervical junction and cervicothoracic junction, were obtained without intravenous contrast. Angiographic images of the head were obtained using MRA technique without contrast. COMPARISON:  Head CT 11/20/2017 FINDINGS: MRI HEAD FINDINGS Brain: Small scattered acute to early subacute cortical and white matter infarcts are present in the right cerebral hemisphere which appear to largely be along superficial and deep watershed zones. The largest confluent area of infarction involves 2 cm of right frontoparietal cortex at the vertex, corresponding to the abnormality described on CT. Multiple sagittally oriented infarcts are present in the right centrum semiovale. Small right parieto-occipital cortical infarcts are noted in the posterior MCA/watershed territory. There are a couple of punctate acute right temporal lobe cortical infarcts, and there is a small acute infarct involving the right basal ganglia and anterior limb of  internal capsule. No acute left cerebral hemispheric or posterior fossa infarcts are identified. Chronic microhemorrhages are noted in the posterior left frontal lobe at the level of the corona radiata and in the right cerebellum. There are small cortical infarcts in the posterior right temporal lobe and lateral right occipital lobe as well as small white matter infarcts in the right centrum semiovale which  have a more late subacute to chronic appearance. Chronic lacunar infarcts are also noted in the basal ganglia bilaterally and in the right pons. The ventricles are normal in size. No mass, midline shift, or extra-axial fluid collection is present. Vascular: Abnormal appearance of the distal right internal carotid artery with some is centric. Skull and upper cervical spine: Unremarkable bone marrow signal. Sinuses/Orbits: Unremarkable orbits. Minimal mucosal thickening in the frontal, ethmoid, and right sphenoid sinuses. Clear mastoid air cells. Other: None. MRA HEAD FINDINGS The visualized distal vertebral arteries are patent to the basilar and codominant. There is mild left V4 stenosis at the vertebrobasilar junction. Patent PICA, AICA, and SCA origins are visualized bilaterally. The basilar artery is patent with motion artifact through its proximal portion and no evidence of significant stenosis. There is a predominantly fetal type origin of the right PCA, possibly with a diminutive and diseased right P1 segment. The large right posterior communicating artery is widely patent, as is the left PCA. The included distal cervical and intracranial segments of both internal carotid arteries are patent to the carotid termini. However, the right ICA is diffusely small in caliber compared to the left. There is abnormal signal diffusely surrounding the right ICA as it enters the skull base as well as throughout the petrous portion. Flow related enhancement is mildly diminished throughout the distal right petrous ICA  extending to the petrous-cavernous junction, and there is superimposed mild additional narrowing throughout this region. The ACAs and MCAs are patent without evidence of proximal branch occlusion or significant proximal stenosis, although the right ACA and right MCA appears slightly attenuated with less robust flow diffusely compared to the contralateral side. No aneurysm is identified. MRI CERVICAL SPINE FINDINGS The study is mildly motion degraded. Alignment: Cervical spine straightening.  No significant listhesis. Vertebrae: No fracture, suspicious osseous lesion, or significant marrow edema. Type 2 degenerative endplate changes from C5-T1. Cord: Normal signal and morphology. Posterior Fossa, vertebral arteries, paraspinal tissues: Preserved vertebral artery flow voids. Abnormal appearance of the proximal right ICA flow void. Unremarkable paraspinal soft tissues. Disc levels: C2-3: Asymmetric right facet arthrosis with likely ankylosis across the joint. Mild uncovertebral spurring. Mild right neural foraminal stenosis. No spinal stenosis. C3-4: Moderate disc space narrowing. Disc bulging, uncovertebral spurring, and mild facet arthrosis result in mild spinal stenosis and moderate to severe bilateral neural foraminal stenosis. C4-5: Mild disc bulging, mild uncovertebral spurring, and mild facet arthrosis result in mild-to-moderate right and moderate left neural foraminal stenosis and borderline spinal stenosis. C5-6: Right greater than left uncovertebral hypertrophy results in severe right and moderate to severe left neural foraminal stenosis. Minimal disc bulging does not result in significant spinal stenosis. C6-7: Uncovertebral spurring results in moderate to severe bilateral neural foraminal stenosis. No spinal stenosis. C7-T1: Uncovertebral spurring and mild disc bulging result in moderate to severe bilateral neural foraminal stenosis without spinal stenosis. T1-2: Bilateral foraminal disc protrusions result  in moderate bilateral neural foraminal stenosis without spinal stenosis. IMPRESSION: 1. 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 2. 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 3. Diffusely abnormal appearance of the distal cervical and proximal intracranial portions of the right ICA including mild diffuse narrowing. Head and neck CTA is recommended to further evaluate for a possible dissection or flow limiting proximal stenosis. 4. Diffuse cervical spondylosis with moderate to severe multilevel neural foraminal stenosis as above. 5. Mild spinal stenosis at C3-4. Electronically  Signed   By: Sebastian Ache M.D.   On: 11/20/2017 16:21   2D Echocardiogram  pending   PHYSICAL EXAM  [place] pleasant middle-age African-American male currently not in distress. . Afebrile. Head is nontraumatic. Neck is supple with soft right carotid  bruit.    Cardiac exam no murmur or gallop. Lungs are clear to auscultation. Distal pulses are well felt. Neurological Exam :  Awake alert oriented 3 normal speech and language function. No aphasia or apraxia dysarthria. Visual fields are full to bedside confrontational testing. Fundi not visualized. Mild left lower facial asymmetry when he smiles. Tongue midline. Motor system exam reveals no upper or lower extremity drift but weakness of left grip and intrinsic hand muscles. Orbits right over left upper extremity. Mild weakness of left triceps and shoulder elevation. Symmetric lower eczema to stand. Deep tendon reflexes are brisker on the left compared to the right. Sensation is slightly diminished on the left compared to the right. Gait not tested.   ASSESSMENT/PLAN [name] is a 57 y.o. male with history of HLD, marijuana use, 2-3 beers/day presenting with L HP and progressive L sided numbness and tingling x 6 months.   Stroke:  Small  right brain watershed infarcts secondary to large vessel disease source ( R ICA near occlusion )  CT head likely R brain infarct s/ loss of gray-white differentiation. chonic B BG lacunes  MRI  / MRA  Mult small R cerebral watershed infarcts in various ages, chronic B BG and pontine infarcts, abn distal R ICA w/ narrowing, diffuse cervical spondylosis w/ neuroal foraminal stenosis, mild spinal stenosis C3-4  CTA head & neck severe near occlusion R ICA at bifurcation 1cm in length w/ decreased flor R ACA and R MCA. Carotid siphon R 75% & L 50%, BA 50%, L VA 50%  2D Echo  pending   LDL 89  HgbA1c 5.6  Lovenox 40 mg sq daily for VTE prophylaxis Diet Order           Diet Heart Room service appropriate? Yes; Fluid consistency: Thin  Diet effective now           No antithrombotic prior to admission, now on aspirin 325 mg daily. Given ICA stenosis, patient should be treated with aspirin 325 mg and clopidogrel 75 mg orally every day   Therapy recommendations:  HH OT, OP PT  Disposition:  Home  from neuro standpoint, recommend R ICA surgery in 1 to 2 weeks  Okay for discharge from neuro standpoint once VVS has consulted  Carotid Stenosis  R ICA near occlusion  VVS consult requested   Discussed with Dr. Irene Limbo  Hypertension  Stable . Given severe carotid stenosis, will keep blood pressure 120-150   Hyperlipidemia  Home meds:  Lipitor 20, increased to 80 in hospital  LDL 89, goal < 70  Continue statin at discharge  Other Stroke Risk Factors  Advanced age  Cigarette smoker, advised to stop smoking  ETOH use, advised to drink no more than 2 drink(s) a day  THC use. UDS positive THC / ETOH level 52  Hospital day # 1  Annie Main, MSN, APRN, ANVP-BC, AGPCNP-BC Advanced Practice Stroke Nurse [facility] See Amion for Schedule & Pager information 11/21/2017 2:57 PM  I have personally examined this patient, reviewed notes, independently viewed  imaging studies, participated in medical decision making and plan of care.ROS completed by me personally and pertinent positives fully documented  I have made any additions or clarifications  directly to the above note. Agree with note above. He has presented with subacuteintermittent left-sided weakness secondary toright brain watershed infarcts due to progressive high-grade proximal right ICA stenosis. He will benefit with emergent revascularization of the right carotid. Vascular surgery consult. Dual antiplatelet therapy of aspirin and Plavix for now. Patient counseled to quit smoking and he is agreeable. Maintain aggressive risk factor modification. Long discussion with patient, son and Dr. Irene LimboGoodrich and answered questions. Greater than 50% time during this 35 minute visit was spent in counseling and coordination of care about symptomatic carotid stenosis, stroke risk discussion and plan for treatment and prevention and answering questions.  Delia HeadyPramod Forrester Blando, MD Medical Director [facility] Pager: [phone] 11/21/2017 3:13 PM  To contact Stroke Continuity provider, please refer to [email or web address]. After hours, contact General Neurology

## 2017-11-21 NOTE — Progress Notes (Signed)
Echocardiogram 2D Echocardiogram has been performed.  Nathan PartridgeBrooke S Spirit Hernandez 11/21/2017, 10:27 AM

## 2017-11-21 NOTE — Progress Notes (Signed)
PROGRESS NOTE  Evart Mcdonnell ZOX:[id number] DOB: 1960-09-25 DOA: 11/20/2017 PCP: Patient, No Pcp Per  Brief Narrative: 57yom presented with 6 month h/o increasing left-sided weakness. MRI showed multiple strokes.  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 --Per neurology vascular surgery consulted.  Aspirin and Plavix.  Stop smoking.  Lipitor increased.  Essential HTN --stable.  Neurology has recommended keeping systolic blood pressure 120-150  Hyperlipidemia --continue atorvastatin   Alcohol use 2-3 beers/day. Alcohol level positive on admission. --Monitor for withdrawal.  Marijuana use --Recommend cessation.   DVT prophylaxis: enoxaparin Code Status: Full Family Communication: son at bedside Disposition Plan: outpt PT    Brendia Sacks, MD  [facility] Direct contact: [phone] --Via amion app OR  --[email or web address]; password [email or web address]  7PM-7AM contact night coverage as above 11/21/2017, 3:46 PM  LOS: 1 day   Consultants:  Neurology   VVS  Procedures:  Echo Study Conclusions  - Left ventricle: The cavity size was normal. Wall thickness was   increased in a pattern of moderate LVH. Systolic function was   normal. The estimated ejection fraction was in the range of 60%   to 65%. Wall motion was normal; there were no regional wall   motion abnormalities. Doppler parameters are consistent with   abnormal left ventricular relaxation (grade 1 diastolic   dysfunction). Indeterminate LV filling pressure. - Mitral valve: Mildly thickened leaflets . There was trivial   regurgitation. - Left atrium: The atrium was normal in size. - Tricuspid valve: There was trivial regurgitation. - Pulmonary arteries: PA peak pressure: 25 mm Hg (S). - Inferior vena cava: The vessel was normal in size. The   respirophasic diameter changes were  in the normal range (>= 50%),   consistent with normal central venous pressure.  Impressions:  - LVEF 60-65%, moderate LVH, normal wall motion, grade 1 DD,   indeterminate LV filling pressure, trivial MR, normal LA size,   trivial TR, RVSP 25 mmHg, normal IVC.  Antimicrobials:    Interval history/Subjective: Feels ok. No new problems.  Objective: Vitals:  Vitals:   11/21/17 1112 11/21/17 1511  BP: 137/89 122/69  Pulse: (!) 55 (!) 55  Resp: 18 19  Temp:    SpO2: 95% 93%    Exam:  Constitutional:  . Appears calm and comfortable Respiratory:  . CTA bilaterally, no w/r/r.  . Respiratory effort normal.  Cardiovascular:  . RRR, no m/r/g . No LE extremity edema   Musculoskeletal:  . RLE, LLE   . RUE/RLE strength appears grossly normal . LUE/LLE strength 4/5. Neurologic:  . CN 2-12 grossly intact Psychiatric:  . Mental status o Mood, affect appropriate  I have personally reviewed the following:   Labs:  CMP, troponin unremarkable on admission, CBC unremarkable  Alcohol level positive on admission.  Urine drug screen positive for marijuana.  Imaging studies:  Imaging studies noted  Medical tests:  EKG SR no acute changes   Scheduled Meds: . aspirin  325 mg Oral Daily  . atorvastatin  80 mg Oral q1800  . clopidogrel  75 mg Oral Daily  . enoxaparin (LOVENOX) injection  40 mg Subcutaneous Q24H  . folic acid  1 mg Oral Daily  . multivitamin with minerals  1 tablet Oral Daily  . nicotine  21 mg Transdermal Daily  . thiamine  100 mg Oral Daily   Or  . thiamine  100 mg Intravenous Daily   Continuous  Infusions:  Principal Problem:   Acute bilat watershed infarction [facility](HCC) Active Problems:   Dyslipidemia   Hypertension   Polysubstance abuse (HCC)   Hyperlipidemia   Marijuana use   LOS: 1 day

## 2017-11-22 ENCOUNTER — Other Ambulatory Visit: Payer: Self-pay | Admitting: *Deleted

## 2017-11-22 ENCOUNTER — Telehealth: Payer: Self-pay | Admitting: *Deleted

## 2017-11-22 MED ORDER — ATORVASTATIN CALCIUM 80 MG PO TABS: 80.0000 mg | ORAL_TABLET | Freq: Every day | ORAL | 0 refills | Status: DC

## 2017-11-22 MED ORDER — NICOTINE 21 MG/24HR TD PT24: 21.0000 mg | MEDICATED_PATCH | Freq: Every day | TRANSDERMAL | 0 refills | Status: DC

## 2017-11-22 MED ORDER — ASPIRIN 325 MG PO TABS: 325.0000 mg | ORAL_TABLET | Freq: Every day | ORAL | 0 refills | Status: DC

## 2017-11-22 MED ORDER — NONFORMULARY OR COMPOUNDED ITEM: 0 refills | Status: DC

## 2017-11-22 MED ORDER — CLOPIDOGREL BISULFATE 75 MG PO TABS: 75.0000 mg | ORAL_TABLET | Freq: Every day | ORAL | 0 refills | Status: DC

## 2017-11-22 NOTE — Progress Notes (Signed)
Pt being discharged from hospital per orders from MD. Pt educated on discharge instructions. Pt verbalized understanding of instructions. All questions and concerns were addressed. Pt's IV was removed prior to discharge. Pt exited hospital via wheelchair accompanied by staff. 

## 2017-11-22 NOTE — Progress Notes (Signed)
Physical Therapy Treatment Patient Details Name: Nathan Hernandez MRN: [id number] DOB: 05-19-1961 Today's Date: 11/22/2017    History of Present Illness Patient is a 58 y/o male admitted on 11/20/17 due to increased N&T at L UE/LE, as well as gait instability and balance deficits. CT of the head revealed chronic appearing lacunar infarcts in the bilateral basal ganglia and right pons.  MRI/MRA brain revealed multiple small infarcts throughout the right cerebral hemisphere primarily in a watershed distribution and varying in age from acute to late subacute/chronic.  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 Patient with a PMH significant for dyslipidemia, Hypertension, tobacco abuse, daily alcohol and marijuana use.  Patient also has a history of osteoarthritis and has chronic left knee pain.    PT Comments    Patient doing well. Reports feeling as if L UE/LE strength is gradually improving. Transfers and gait with SPC at SUP/Min guard level for safety. Improved toe clearance during gait with no episode of LOB or toe drag. Good obstacle navigation as well as awareness of surroundings. Will continue to follow acutely.    Follow Up Recommendations  Outpatient PT;Supervision - Intermittent     Equipment Recommendations  None recommended by PT    Recommendations for Other Services       Precautions / Restrictions Precautions Precautions: Fall Restrictions Weight Bearing Restrictions: No    Mobility  Bed Mobility Overal bed mobility: Modified Independent                Transfers Overall transfer level: Needs assistance Equipment used: Straight cane Transfers: Sit to/from Stand Sit to Stand: Supervision         General transfer comment: for safety  Ambulation/Gait Ambulation/Gait assistance: Min guard;Supervision Gait Distance (Feet): 250 Feet Assistive device: Straight cane Gait Pattern/deviations: Step-through  pattern;Decreased stride length Gait velocity: decreased   General Gait Details: good obstacle navigation, improved L foot clearance with no episode of toe drag, no LOB   Stairs             Wheelchair Mobility    Modified Rankin (Stroke Patients Only) Modified Rankin (Stroke Patients Only) Pre-Morbid Rankin Score: No significant disability Modified Rankin: Moderately severe disability     Balance Overall balance assessment: Needs assistance Sitting-balance support: No upper extremity supported;Feet supported Sitting balance-Leahy Scale: Good     Standing balance support: Single extremity supported;During functional activity Standing balance-Leahy Scale: Fair                              Cognition Arousal/Alertness: Awake/alert Behavior During Therapy: WFL for tasks assessed/performed Overall Cognitive Status: Within Functional Limits for tasks assessed                                        Exercises      General Comments        Pertinent Vitals/Pain Pain Assessment: No/denies pain    Home Living                      Prior Function            PT Goals (current goals can now be found in the care plan section) Acute Rehab PT Goals Patient Stated Goal: none stated PT Goal Formulation: With patient Time For Goal Achievement: 12/05/17 Potential to  Achieve Goals: Good Progress towards PT goals: Progressing toward goals    Frequency    Min 4X/week      PT Plan Current plan remains appropriate    Co-evaluation              AM-PAC PT "6 Clicks" Daily Activity  Outcome Measure  Difficulty turning over in bed (including adjusting bedclothes, sheets and blankets)?: A Little Difficulty moving from lying on back to sitting on the side of the bed? : A Little Difficulty sitting down on and standing up from a chair with arms (e.g., wheelchair, bedside commode, etc,.)?: A Little Help needed moving to and from a  bed to chair (including a wheelchair)?: A Little Help needed walking in hospital room?: A Little Help needed climbing 3-5 steps with a railing? : A Little 6 Click Score: 18    End of Session Equipment Utilized During Treatment: Gait belt Activity Tolerance: Patient tolerated treatment well Patient left: in bed;with call bell/phone within reach Nurse Communication: Mobility status PT Visit Diagnosis: Unsteadiness on feet (R26.81);Other abnormalities of gait and mobility (R26.89);Muscle weakness (generalized) (M62.81);Hemiplegia and hemiparesis Hemiplegia - Right/Left: Left Hemiplegia - dominant/non-dominant: Non-dominant Hemiplegia - caused by: Cerebral infarction     Time: [id number] PT Time Calculation (min) (ACUTE ONLY): 14 min  Charges:  $Gait Training: 8-22 mins                    G Codes:       Kipp LaurenceStephanie R Aaron, PT, DPT 11/22/17 11:15 AM

## 2017-11-22 NOTE — Progress Notes (Signed)
STROKE TEAM PROGRESS NOTE   INTERVAL HISTORY His daughter is at the bedside.  Patient has been seen by vascular surgery and emergent right carotid surgery is scheduled for Friday morning. He is neurologically stable to be discharged home to come back to the procedure  Vitals:   11/21/17 2013 11/21/17 2311 11/22/17 0337 11/22/17 1220  BP: (!) 155/85 (!) 154/90 136/83   Pulse: (!) 59 (!) 102 79   Resp: 18  18   Temp: 98.3 F (36.8 C) 98 F (36.7 C) 98.4 F (36.9 C) 97.8 F (36.6 C)  TempSrc: Oral Oral Oral Oral  SpO2: 100% 100% 100%   Weight:      Height:        CBC:  Recent Labs  Lab 11/20/17 1143  WBC 5.4  NEUTROABS 1.9  HGB 15.9  HCT 48.8  MCV 87.3  PLT 311    Basic Metabolic Panel:  Recent Labs  Lab 11/20/17 1143  NA 140  K 3.9  CL 105  CO2 25  GLUCOSE 96  BUN <5*  CREATININE 0.73  CALCIUM 9.3   Lipid Panel:     Component Value Date/Time   CHOL 161 11/21/2017 0529   TRIG 76 11/21/2017 0529   HDL 57 11/21/2017 0529   CHOLHDL 2.8 11/21/2017 0529   VLDL 15 11/21/2017 0529   LDLCALC 89 11/21/2017 0529   HgbA1c:  Lab Results  Component Value Date   HGBA1C 5.6 11/21/2017   Urine Drug Screen:     Component Value Date/Time   LABOPIA NONE DETECTED 11/20/2017 1915   COCAINSCRNUR NONE DETECTED 11/20/2017 1915   LABBENZ NONE DETECTED 11/20/2017 1915   AMPHETMU NONE DETECTED 11/20/2017 1915   THCU POSITIVE (A) 11/20/2017 1915   LABBARB (A) 11/20/2017 1915    Result not available. Reagent lot number recalled by manufacturer.    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 EXAM: CT ANGIOGRAPHY HEAD AND NECK TECHNIQUE: Multidetector CT imaging of the head and neck was performed using the standard protocol during bolus  administration of intravenous contrast. Multiplanar CT image reconstructions and MIPs were obtained to evaluate the vascular anatomy. Carotid stenosis measurements (when applicable) are obtained utilizing NASCET criteria, using the distal internal carotid diameter as the denominator. CONTRAST:  50mL ISOVUE-370 IOPAMIDOL (ISOVUE-370) INJECTION 76% COMPARISON:  Prior head CT and MRI from earlier the same day. FINDINGS: CTA NECK FINDINGS Aortic arch: Visualized aortic arch of normal caliber with normal branch pattern. Mild aortic atherosclerosis without hemodynamically significant stenosis about the origin of the great vessels. Visualized subclavian arteries widely patent. Right carotid system: Right common carotid artery patent from its origin to the bifurcation without hemodynamically significant stenosis. Mixed atheromatous plaque at the proximal right ICA with severe near occlusive stenosis (series 7, image 191). A string sign is faintly visible on axial sequences. Stenosis begins approximately 4 mm distal to the bifurcation and measures approximately 1 cm in length. Right ICA widely patent distally to the skull base without additional stenosis or occlusion. Left carotid system: Scattered atheromatous plaque within the left common carotid artery without hemodynamically significant stenosis. A centric calcified plaque about the left bifurcation without significant stenosis. Left ICA patent distally to the skull base without stenosis, dissection, or occlusion. Vertebral  arteries: Both of the vertebral arteries arise from the subclavian arteries. Focal plaque at the origin of the left vertebral artery with approximate 50% stenosis. Vertebral arteries otherwise widely patent within the neck. Skeleton: No acute osseous abnormality. No worrisome lytic or blastic osseous lesions. Multilevel degenerative disc disease and facet arthritis throughout the cervical spine, better evaluated on prior MRI. Other neck: Unremarkable.  Upper chest: Unremarkable. Review of the MIP images confirms the above findings CTA HEAD FINDINGS Anterior circulation: Eccentric calcified plaque within the horizontal petrous right ICA with mild stenosis. Fairly heavy atheromatous burden throughout the cavernous/supraclinoid right ICA with moderate to advanced atheromatous stenosis of up to approximately 75%. Right ICA terminus widely patent. On the left, petrous left ICA widely patent without stenosis. Atheromatous plaque throughout the left carotid siphon with moderate multifocal narrowing of up to approximately 50%. Left ICA terminus widely patent. A1 segments mildly irregular but patent bilaterally. Right A1 hypoplastic. Normal anterior communicating artery. Anterior cerebral arteries patent to their distal aspects without stenosis. No M1 stenosis or occlusion. No proximal M2 occlusion distal MCA branches patent bilaterally, although overall the right MCA and ACA are attenuated as compared to the left due to the proximal right ICA stenosis. Posterior circulation: Atheromatous irregularity within the distal right V4 segment without high-grade stenosis. Left V4 widely patent to the vertebrobasilar junction. Posterior inferior cerebral arteries patent bilaterally. Multifocal atheromatous irregularity throughout the basilar artery. Moderate stenoses of up to 50% seen involving the proximal and mid basilar artery. Superior cerebral arteries patent bilaterally. Left PCA supplied via the basilar. Fetal type origin of the right PCA. PCAs patent to their distal aspects without high-grade stenosis. Venous sinuses: Grossly patent, although not well assessed due to arterial timing of the contrast bolus. Anatomic variants: Fetal type origin of the right PCA. No intracranial aneurysm. Delayed phase: Not performed. Review of the MIP images confirms the above findings IMPRESSION: 1. 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 Secondary downstream attenuation with decreased flow throughout the right ACA and MCA territories as compared to the left. 2. Prominent carotid siphon atherosclerotic change with associated stenoses of up to 75% on the right and 50% on the left. 3. 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 4. 50% stenosis at the origin of the left vertebral artery. 5. Fetal type right PCA. Electronically Signed   By: Rise Mu M.D.   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 EXAM: CT ANGIOGRAPHY HEAD AND NECK TECHNIQUE: Multidetector CT imaging of the head and neck was performed using the standard protocol during bolus administration of intravenous contrast. Multiplanar CT image reconstructions and MIPs were obtained to evaluate the vascular anatomy. Carotid stenosis measurements (when applicable) are obtained utilizing NASCET criteria, using the distal internal carotid diameter as the denominator. CONTRAST:  50mL ISOVUE-370 IOPAMIDOL (ISOVUE-370) INJECTION 76% COMPARISON:  Prior head CT and MRI from earlier the same day. FINDINGS: CTA NECK FINDINGS Aortic arch: Visualized aortic arch of normal caliber with normal branch pattern. Mild aortic atherosclerosis without hemodynamically significant stenosis about the origin of the great vessels. Visualized subclavian arteries widely patent. Right carotid system: Right common carotid artery patent from its origin to the bifurcation without hemodynamically significant  stenosis. Mixed atheromatous plaque at the proximal right ICA with severe near occlusive stenosis (series 7, image 191). A string sign is faintly visible on axial sequences. Stenosis begins approximately 4 mm distal to the  bifurcation and measures approximately 1 cm in length. Right ICA widely patent distally to the skull base without additional stenosis or occlusion. Left carotid system: Scattered atheromatous plaque within the left common carotid artery without hemodynamically significant stenosis. A centric calcified plaque about the left bifurcation without significant stenosis. Left ICA patent distally to the skull base without stenosis, dissection, or occlusion. Vertebral arteries: Both of the vertebral arteries arise from the subclavian arteries. Focal plaque at the origin of the left vertebral artery with approximate 50% stenosis. Vertebral arteries otherwise widely patent within the neck. Skeleton: No acute osseous abnormality. No worrisome lytic or blastic osseous lesions. Multilevel degenerative disc disease and facet arthritis throughout the cervical spine, better evaluated on prior MRI. Other neck: Unremarkable. Upper chest: Unremarkable. Review of the MIP images confirms the above findings CTA HEAD FINDINGS Anterior circulation: Eccentric calcified plaque within the horizontal petrous right ICA with mild stenosis. Fairly heavy atheromatous burden throughout the cavernous/supraclinoid right ICA with moderate to advanced atheromatous stenosis of up to approximately 75%. Right ICA terminus widely patent. On the left, petrous left ICA widely patent without stenosis. Atheromatous plaque throughout the left carotid siphon with moderate multifocal narrowing of up to approximately 50%. Left ICA terminus widely patent. A1 segments mildly irregular but patent bilaterally. Right A1 hypoplastic. Normal anterior communicating artery. Anterior cerebral arteries patent to their distal aspects without stenosis. No M1 stenosis or occlusion. No proximal M2 occlusion distal MCA branches patent bilaterally, although overall the right MCA and ACA are attenuated as compared to the left due to the proximal right ICA stenosis. Posterior  circulation: Atheromatous irregularity within the distal right V4 segment without high-grade stenosis. Left V4 widely patent to the vertebrobasilar junction. Posterior inferior cerebral arteries patent bilaterally. Multifocal atheromatous irregularity throughout the basilar artery. Moderate stenoses of up to 50% seen involving the proximal and mid basilar artery. Superior cerebral arteries patent bilaterally. Left PCA supplied via the basilar. Fetal type origin of the right PCA. PCAs patent to their distal aspects without high-grade stenosis. Venous sinuses: Grossly patent, although not well assessed due to arterial timing of the contrast bolus. Anatomic variants: Fetal type origin of the right PCA. No intracranial aneurysm. Delayed phase: Not performed. Review of the MIP images confirms the above findings IMPRESSION: 1. 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 Secondary downstream attenuation with decreased flow throughout the right ACA and MCA territories as compared to the left. 2. Prominent carotid siphon atherosclerotic change with associated stenoses of up to 75% on the right and 50% on the left. 3. 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 4. 50% stenosis at the origin of the left vertebral artery. 5. Fetal type right PCA. Electronically Signed   By: Rise Mu M.D.   On: 11/20/2017 20:05   Mr Maxine Glenn Head Wo Contrast  Result Date: 11/20/2017 CLINICAL DATA:  Left-sided weakness for several months. Numbness and tingling in the extremities. EXAM: MRI HEAD WITHOUT CONTRAST MRA HEAD WITHOUT CONTRAST MRI CERVICAL SPINE WITHOUT CONTRAST TECHNIQUE: Multiplanar, multiecho pulse sequences of the brain and surrounding structures, and cervical spine, to include the craniocervical junction and cervicothoracic junction, were obtained without  intravenous contrast.  Angiographic images of the head were obtained using MRA technique without contrast. COMPARISON:  Head CT 11/20/2017 FINDINGS: MRI HEAD FINDINGS Brain: Small scattered acute to early subacute cortical and white matter infarcts are present in the right cerebral hemisphere which appear to largely be along superficial and deep watershed zones. The largest confluent area of infarction involves 2 cm of right frontoparietal cortex at the vertex, corresponding to the abnormality described on CT. Multiple sagittally oriented infarcts are present in the right centrum semiovale. Small right parieto-occipital cortical infarcts are noted in the posterior MCA/watershed territory. There are a couple of punctate acute right temporal lobe cortical infarcts, and there is a small acute infarct involving the right basal ganglia and anterior limb of internal capsule. No acute left cerebral hemispheric or posterior fossa infarcts are identified. Chronic microhemorrhages are noted in the posterior left frontal lobe at the level of the corona radiata and in the right cerebellum. There are small cortical infarcts in the posterior right temporal lobe and lateral right occipital lobe as well as small white matter infarcts in the right centrum semiovale which have a more late subacute to chronic appearance. Chronic lacunar infarcts are also noted in the basal ganglia bilaterally and in the right pons. The ventricles are normal in size. No mass, midline shift, or extra-axial fluid collection is present. Vascular: Abnormal appearance of the distal right internal carotid artery with some is centric. Skull and upper cervical spine: Unremarkable bone marrow signal. Sinuses/Orbits: Unremarkable orbits. Minimal mucosal thickening in the frontal, ethmoid, and right sphenoid sinuses. Clear mastoid air cells. Other: None. MRA HEAD FINDINGS The visualized distal vertebral arteries are patent to the basilar and codominant. There is  mild left V4 stenosis at the vertebrobasilar junction. Patent PICA, AICA, and SCA origins are visualized bilaterally. The basilar artery is patent with motion artifact through its proximal portion and no evidence of significant stenosis. There is a predominantly fetal type origin of the right PCA, possibly with a diminutive and diseased right P1 segment. The large right posterior communicating artery is widely patent, as is the left PCA. The included distal cervical and intracranial segments of both internal carotid arteries are patent to the carotid termini. However, the right ICA is diffusely small in caliber compared to the left. There is abnormal signal diffusely surrounding the right ICA as it enters the skull base as well as throughout the petrous portion. Flow related enhancement is mildly diminished throughout the distal right petrous ICA extending to the petrous-cavernous junction, and there is superimposed mild additional narrowing throughout this region. The ACAs and MCAs are patent without evidence of proximal branch occlusion or significant proximal stenosis, although the right ACA and right MCA appears slightly attenuated with less robust flow diffusely compared to the contralateral side. No aneurysm is identified. MRI CERVICAL SPINE FINDINGS The study is mildly motion degraded. Alignment: Cervical spine straightening.  No significant listhesis. Vertebrae: No fracture, suspicious osseous lesion, or significant marrow edema. Type 2 degenerative endplate changes from C5-T1. Cord: Normal signal and morphology. Posterior Fossa, vertebral arteries, paraspinal tissues: Preserved vertebral artery flow voids. Abnormal appearance of the proximal right ICA flow void. Unremarkable paraspinal soft tissues. Disc levels: C2-3: Asymmetric right facet arthrosis with likely ankylosis across the joint. Mild uncovertebral spurring. Mild right neural foraminal stenosis. No spinal stenosis. C3-4: Moderate disc space  narrowing. Disc bulging, uncovertebral spurring, and mild facet arthrosis result in mild spinal stenosis and moderate to severe bilateral neural foraminal stenosis. C4-5: Mild disc bulging, mild uncovertebral  spurring, and mild facet arthrosis result in mild-to-moderate right and moderate left neural foraminal stenosis and borderline spinal stenosis. C5-6: Right greater than left uncovertebral hypertrophy results in severe right and moderate to severe left neural foraminal stenosis. Minimal disc bulging does not result in significant spinal stenosis. C6-7: Uncovertebral spurring results in moderate to severe bilateral neural foraminal stenosis. No spinal stenosis. C7-T1: Uncovertebral spurring and mild disc bulging result in moderate to severe bilateral neural foraminal stenosis without spinal stenosis. T1-2: Bilateral foraminal disc protrusions result in moderate bilateral neural foraminal stenosis without spinal stenosis. IMPRESSION: 1. 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 2. 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 3. Diffusely abnormal appearance of the distal cervical and proximal intracranial portions of the right ICA including mild diffuse narrowing. Head and neck CTA is recommended to further evaluate for a possible dissection or flow limiting proximal stenosis. 4. Diffuse cervical spondylosis with moderate to severe multilevel neural foraminal stenosis as above. 5. Mild spinal stenosis at C3-4. Electronically Signed   By: Sebastian Ache M.D.   On: 11/20/2017 16:21   Mr Brain Wo Contrast  Result Date: 11/20/2017 CLINICAL DATA:  Left-sided weakness for several months. Numbness and tingling in the extremities. EXAM: MRI HEAD WITHOUT CONTRAST MRA HEAD WITHOUT CONTRAST MRI CERVICAL SPINE WITHOUT CONTRAST TECHNIQUE: Multiplanar, multiecho pulse sequences of the brain and  surrounding structures, and cervical spine, to include the craniocervical junction and cervicothoracic junction, were obtained without intravenous contrast. Angiographic images of the head were obtained using MRA technique without contrast. COMPARISON:  Head CT 11/20/2017 FINDINGS: MRI HEAD FINDINGS Brain: Small scattered acute to early subacute cortical and white matter infarcts are present in the right cerebral hemisphere which appear to largely be along superficial and deep watershed zones. The largest confluent area of infarction involves 2 cm of right frontoparietal cortex at the vertex, corresponding to the abnormality described on CT. Multiple sagittally oriented infarcts are present in the right centrum semiovale. Small right parieto-occipital cortical infarcts are noted in the posterior MCA/watershed territory. There are a couple of punctate acute right temporal lobe cortical infarcts, and there is a small acute infarct involving the right basal ganglia and anterior limb of internal capsule. No acute left cerebral hemispheric or posterior fossa infarcts are identified. Chronic microhemorrhages are noted in the posterior left frontal lobe at the level of the corona radiata and in the right cerebellum. There are small cortical infarcts in the posterior right temporal lobe and lateral right occipital lobe as well as small white matter infarcts in the right centrum semiovale which have a more late subacute to chronic appearance. Chronic lacunar infarcts are also noted in the basal ganglia bilaterally and in the right pons. The ventricles are normal in size. No mass, midline shift, or extra-axial fluid collection is present. Vascular: Abnormal appearance of the distal right internal carotid artery with some is centric. Skull and upper cervical spine: Unremarkable bone marrow signal. Sinuses/Orbits: Unremarkable orbits. Minimal mucosal thickening in the frontal, ethmoid, and right sphenoid sinuses. Clear mastoid  air cells. Other: None. MRA HEAD FINDINGS The visualized distal vertebral arteries are patent to the basilar and codominant. There is mild left V4 stenosis at the vertebrobasilar junction. Patent PICA, AICA, and SCA origins are visualized bilaterally. The basilar artery is patent with motion artifact through its proximal portion and no evidence of significant stenosis. There is a predominantly fetal type origin  of the right PCA, possibly with a diminutive and diseased right P1 segment. The large right posterior communicating artery is widely patent, as is the left PCA. The included distal cervical and intracranial segments of both internal carotid arteries are patent to the carotid termini. However, the right ICA is diffusely small in caliber compared to the left. There is abnormal signal diffusely surrounding the right ICA as it enters the skull base as well as throughout the petrous portion. Flow related enhancement is mildly diminished throughout the distal right petrous ICA extending to the petrous-cavernous junction, and there is superimposed mild additional narrowing throughout this region. The ACAs and MCAs are patent without evidence of proximal branch occlusion or significant proximal stenosis, although the right ACA and right MCA appears slightly attenuated with less robust flow diffusely compared to the contralateral side. No aneurysm is identified. MRI CERVICAL SPINE FINDINGS The study is mildly motion degraded. Alignment: Cervical spine straightening.  No significant listhesis. Vertebrae: No fracture, suspicious osseous lesion, or significant marrow edema. Type 2 degenerative endplate changes from C5-T1. Cord: Normal signal and morphology. Posterior Fossa, vertebral arteries, paraspinal tissues: Preserved vertebral artery flow voids. Abnormal appearance of the proximal right ICA flow void. Unremarkable paraspinal soft tissues. Disc levels: C2-3: Asymmetric right facet arthrosis with likely ankylosis  across the joint. Mild uncovertebral spurring. Mild right neural foraminal stenosis. No spinal stenosis. C3-4: Moderate disc space narrowing. Disc bulging, uncovertebral spurring, and mild facet arthrosis result in mild spinal stenosis and moderate to severe bilateral neural foraminal stenosis. C4-5: Mild disc bulging, mild uncovertebral spurring, and mild facet arthrosis result in mild-to-moderate right and moderate left neural foraminal stenosis and borderline spinal stenosis. C5-6: Right greater than left uncovertebral hypertrophy results in severe right and moderate to severe left neural foraminal stenosis. Minimal disc bulging does not result in significant spinal stenosis. C6-7: Uncovertebral spurring results in moderate to severe bilateral neural foraminal stenosis. No spinal stenosis. C7-T1: Uncovertebral spurring and mild disc bulging result in moderate to severe bilateral neural foraminal stenosis without spinal stenosis. T1-2: Bilateral foraminal disc protrusions result in moderate bilateral neural foraminal stenosis without spinal stenosis. IMPRESSION: 1. 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 2. 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 3. Diffusely abnormal appearance of the distal cervical and proximal intracranial portions of the right ICA including mild diffuse narrowing. Head and neck CTA is recommended to further evaluate for a possible dissection or flow limiting proximal stenosis. 4. Diffuse cervical spondylosis with moderate to severe multilevel neural foraminal stenosis as above. 5. Mild spinal stenosis at C3-4. Electronically Signed   By: Sebastian Ache M.D.   On: 11/20/2017 16:21   Mr Cervical Spine Wo Contrast  Result Date: 11/20/2017 CLINICAL DATA:  Left-sided weakness for several months. Numbness and tingling in the extremities. EXAM: MRI HEAD  WITHOUT CONTRAST MRA HEAD WITHOUT CONTRAST MRI CERVICAL SPINE WITHOUT CONTRAST TECHNIQUE: Multiplanar, multiecho pulse sequences of the brain and surrounding structures, and cervical spine, to include the craniocervical junction and cervicothoracic junction, were obtained without intravenous contrast. Angiographic images of the head were obtained using MRA technique without contrast. COMPARISON:  Head CT 11/20/2017 FINDINGS: MRI HEAD FINDINGS Brain: Small scattered acute to early subacute cortical and white matter infarcts are present in the right cerebral hemisphere which appear to largely be along superficial and deep watershed zones. The largest confluent area of infarction involves 2 cm of right frontoparietal  cortex at the vertex, corresponding to the abnormality described on CT. Multiple sagittally oriented infarcts are present in the right centrum semiovale. Small right parieto-occipital cortical infarcts are noted in the posterior MCA/watershed territory. There are a couple of punctate acute right temporal lobe cortical infarcts, and there is a small acute infarct involving the right basal ganglia and anterior limb of internal capsule. No acute left cerebral hemispheric or posterior fossa infarcts are identified. Chronic microhemorrhages are noted in the posterior left frontal lobe at the level of the corona radiata and in the right cerebellum. There are small cortical infarcts in the posterior right temporal lobe and lateral right occipital lobe as well as small white matter infarcts in the right centrum semiovale which have a more late subacute to chronic appearance. Chronic lacunar infarcts are also noted in the basal ganglia bilaterally and in the right pons. The ventricles are normal in size. No mass, midline shift, or extra-axial fluid collection is present. Vascular: Abnormal appearance of the distal right internal carotid artery with some is centric. Skull and upper cervical spine: Unremarkable bone  marrow signal. Sinuses/Orbits: Unremarkable orbits. Minimal mucosal thickening in the frontal, ethmoid, and right sphenoid sinuses. Clear mastoid air cells. Other: None. MRA HEAD FINDINGS The visualized distal vertebral arteries are patent to the basilar and codominant. There is mild left V4 stenosis at the vertebrobasilar junction. Patent PICA, AICA, and SCA origins are visualized bilaterally. The basilar artery is patent with motion artifact through its proximal portion and no evidence of significant stenosis. There is a predominantly fetal type origin of the right PCA, possibly with a diminutive and diseased right P1 segment. The large right posterior communicating artery is widely patent, as is the left PCA. The included distal cervical and intracranial segments of both internal carotid arteries are patent to the carotid termini. However, the right ICA is diffusely small in caliber compared to the left. There is abnormal signal diffusely surrounding the right ICA as it enters the skull base as well as throughout the petrous portion. Flow related enhancement is mildly diminished throughout the distal right petrous ICA extending to the petrous-cavernous junction, and there is superimposed mild additional narrowing throughout this region. The ACAs and MCAs are patent without evidence of proximal branch occlusion or significant proximal stenosis, although the right ACA and right MCA appears slightly attenuated with less robust flow diffusely compared to the contralateral side. No aneurysm is identified. MRI CERVICAL SPINE FINDINGS The study is mildly motion degraded. Alignment: Cervical spine straightening.  No significant listhesis. Vertebrae: No fracture, suspicious osseous lesion, or significant marrow edema. Type 2 degenerative endplate changes from C5-T1. Cord: Normal signal and morphology. Posterior Fossa, vertebral arteries, paraspinal tissues: Preserved vertebral artery flow voids. Abnormal appearance of the  proximal right ICA flow void. Unremarkable paraspinal soft tissues. Disc levels: C2-3: Asymmetric right facet arthrosis with likely ankylosis across the joint. Mild uncovertebral spurring. Mild right neural foraminal stenosis. No spinal stenosis. C3-4: Moderate disc space narrowing. Disc bulging, uncovertebral spurring, and mild facet arthrosis result in mild spinal stenosis and moderate to severe bilateral neural foraminal stenosis. C4-5: Mild disc bulging, mild uncovertebral spurring, and mild facet arthrosis result in mild-to-moderate right and moderate left neural foraminal stenosis and borderline spinal stenosis. C5-6: Right greater than left uncovertebral hypertrophy results in severe right and moderate to severe left neural foraminal stenosis. Minimal disc bulging does not result in significant spinal stenosis. C6-7: Uncovertebral spurring results in moderate to severe bilateral neural foraminal stenosis. No spinal stenosis.  C7-T1: Uncovertebral spurring and mild disc bulging result in moderate to severe bilateral neural foraminal stenosis without spinal stenosis. T1-2: Bilateral foraminal disc protrusions result in moderate bilateral neural foraminal stenosis without spinal stenosis. IMPRESSION: 1. 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 2. 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 3. Diffusely abnormal appearance of the distal cervical and proximal intracranial portions of the right ICA including mild diffuse narrowing. Head and neck CTA is recommended to further evaluate for a possible dissection or flow limiting proximal stenosis. 4. Diffuse cervical spondylosis with moderate to severe multilevel neural foraminal stenosis as above. 5. Mild spinal stenosis at C3-4. Electronically Signed   By: Sebastian Ache M.D.   On: 11/20/2017 16:21   2D Echocardiogram  pending    PHYSICAL EXAM  [place] middle-age African-American male currently not in distress. . Afebrile. Head is nontraumatic. Neck is supple with soft right carotid  bruit.    Cardiac exam no murmur or gallop. Lungs are clear to auscultation. Distal pulses are well felt. Neurological Exam :  Awake alert oriented 3 normal speech and language function. No aphasia or apraxia dysarthria. Visual fields are full to bedside confrontational testing. Fundi not visualized. Mild left lower facial asymmetry when he smiles. Tongue midline. Motor system exam reveals no upper or lower extremity drift but weakness of left grip and intrinsic hand muscles. Orbits right over left upper extremity. Mild weakness of left triceps and shoulder elevation. Symmetric lower eczema to stand. Deep tendon reflexes are brisker on the left compared to the right. Sensation is slightly diminished on the left compared to the right. Gait not tested.   ASSESSMENT/PLAN [name] is a 57 y.o. male with history of HLD, marijuana use, 2-3 beers/day presenting with L HP and progressive L sided numbness and tingling x 6 months.   Stroke:  Small right brain watershed infarcts secondary to large vessel disease source ( R ICA near occlusion )  CT head likely R brain infarct s/ loss of gray-white differentiation. chonic B BG lacunes  MRI  / MRA  Mult small R cerebral watershed infarcts in various ages, chronic B BG and pontine infarcts, abn distal R ICA w/ narrowing, diffuse cervical spondylosis w/ neuroal foraminal stenosis, mild spinal stenosis C3-4  CTA head & neck severe near occlusion R ICA at bifurcation 1cm in length w/ decreased flor R ACA and R MCA. Carotid siphon R 75% & L 50%, BA 50%, L VA 50% 2D Echo  Left ventricle: The cavity size was normal. Wall thickness was  increased in a pattern of moderate LVH. Systolic function was  normal. The estimated ejection fraction was in the range of 60%  to 65%. Wall motion was  normal; there were no regional wall  motion abnormalities.     LDL 89  HgbA1c 5.6  Lovenox 40 mg sq daily for VTE prophylaxis Diet Order           Diet - low sodium heart healthy        Diet Heart Room service appropriate? Yes; Fluid consistency: Thin  Diet effective now          No antithrombotic prior to admission, now on aspirin 325 mg daily. Given ICA stenosis, patient should be treated with aspirin 325 mg and clopidogrel 75 mg orally every day   Therapy recommendations:  HH OT, OP PT  Disposition:  Home  from  neuro standpoint, recommend R ICA surgery in 1 to 2 weeks  Okay for discharge from neuro standpoint once [facility] has consulted  Carotid Stenosis  R ICA near occlusion  [facility]VS consult requested   Discussed with Dr. Irene Limbo  Hypertension  Stable . Given severe carotid stenosis, will keep blood pressure 120-150   Hyperlipidemia  Home meds:  Lipitor 20, increased to 80 in hospital  LDL 89, goal < 70  Continue statin at discharge  Other Stroke Risk Factors  Advanced age  Cigarette smoker, advised to stop smoking  ETOH use, advised to drink no more than 2 drink(s) a day  THC use. UDS positive THC / ETOH level 52  Hospital day # 2     he patient is recovering well neurologically. Agree with early revascularization given high-grade carotid stenosis with recurrent symptoms. Discussed with Dr. Jerral Ralph  .Dual antiplatelet therapy of aspirin and Plavix for now. Patient counseled to quit smoking and he is agreeable. Maintain aggressive risk factor modification.  . Greater than 50% time during this 25 minute visit was spent in counseling and coordination of care about symptomatic carotid stenosis, stroke risk discussion and plan for treatment and prevention and answering questions. Follow-up as an outpatient in stroke clinic in 6 weeks. Delia Heady, MD Medical Director [facility] Pager: [phone] 11/22/2017 12:54 PM  To contact Stroke  Continuity provider, please refer to [email or web address]. After hours, contact General Neurology

## 2017-11-22 NOTE — Care Management Note (Addendum)
Case Management Note  Patient Details  Name: Nathan Hernandez MRN: [id number] Date of Birth: Aug 22, 1960  Subjective/Objective:   Pt admitted with CVA. He is from home with family.                  Action/Plan: Pt to return on Friday for CE. CM consulted for outpatient therapy. Pt would like to attend [facility]. Orders in Epic and information on the AVS. Pt was active with [facility] in 2016. CM called [facility] and patient has to reestablish as a new patient. CM provided him the information to call Monday am for an appointment. CM also left message with [facility] to see if patient could be followed there.  [facility] for his d/c medications. CM informed him of this and provided him the numbers to the other [facility] in case unable to [facility].  Pt states he has transportation home.   Addendum: [facility] called and is able to see the patient on July 10th. Patient provided the information.  Expected Discharge Date:  11/22/17               Expected Discharge Plan:  OP Rehab  In-House Referral:     Discharge planning Services  CM Consult, [facility]igent [facility]  Post Acute Care Choice:    Choice offered to:  Patient  DME Arranged:    DME Agency:     HH Arranged:    HH Agency:     Status of Service:  Completed, signed off  If discussed at [employer] Meetings, dates discussed:    Additional Comments:  Nathan BaloKelli F Saralynn Langhorst, RN 11/22/2017, 12:10 PM

## 2017-11-22 NOTE — Discharge Summary (Signed)
PATIENT DETAILS Name: Nathan Hernandez Age: 57 y.o. Sex: male Date of Birth: 09/14/1960 MRN: [id number]. Admitting Physician: Edsel Petrin, DO WUJ:[id number], No Pcp Per  Admit Date: 11/20/2017 Discharge date: 11/22/2017  Recommendations for Outpatient Follow-up:  1. Follow up with PCP in 1-2 weeks 2. Please obtain BMP/CBC in one week 3. Please ensure follow-up with neurology and vascular surgery.    Admitted From:  Home  Disposition: Home   Home Health: No  Equipment/Devices: None  Discharge Condition: Stable  CODE STATUS: FULL CODE  Diet recommendation:  Heart Healthy  Brief Summary: See H&P, Labs, Consult and Test reports for all details in brief, patient is a 57 year old male with prior history of hypertension, dyslipidemia, marijuana use who presented with  left-sided weakness, further evaluation with an MRI showed multiple CVAs.  Patient was subsequently admitted to the hospitalist service for further inpatient evaluation and treatment.   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  Due to significant ICA stenosis-continue to keep blood pressure on the higher side-allow permissive hypertension-we will continue to hold all antihypertensive medications for now.  Vascular surgery has evaluated the patient-carotid endarterectomy scheduled for 6/21, this MD spoke with Dr. Pearlean Brownie and then touch base with vascular surgery-okay to discharge patient today, vascular surgery will arrange for follow-up and get in touch with the patient for carotid endarterectomy scheduled for 6/21.  Continue dual antiplatelets and statin on discharge.  Essential HTN --stable.  Neurology has recommended keeping systolic blood pressure 120-150-hence will not discharged on any antihypertensives for  now.  Hyperlipidemia --continue atorvastatin   Alcohol use 2-3 beers/day. Alcohol level positive on admission. --Monitor for withdrawal-no signs as of yet-completely awake and alert without any tremors at the time of discharge.  Marijuana use --Recommend cessation.  Procedures/Studies: None  Discharge Diagnoses:  Principal Problem:   Acute bilat watershed infarction [facility]) Active Problems:   Dyslipidemia   Hypertension   Polysubstance abuse (HCC)   Hyperlipidemia   Marijuana use   Discharge Instructions:  Activity:  As tolerated    Discharge Instructions    Ambulatory referral to Neurology   Complete by:  As directed    An appointment is requested in approximately: 4 weeks   Diet - low sodium heart healthy   Complete by:  As directed    Discharge instructions   Complete by:  As directed    Follow with Primary MD IN 1 WEEK  Vascular Surgery will call you with instructions for Surgery on Friday  Please get a complete blood count and chemistry panel checked by your Primary MD at your next visit, and again as instructed by your Primary MD.  Get Medicines reviewed and adjusted: Please take all your medications with you for your next visit with your Primary MD  Laboratory/radiological data: Please request your Primary MD to go over all hospital tests and procedure/radiological results at the follow up, please ask your Primary MD to get all Hospital records sent to his/her office.  In some cases, they will be blood work, cultures and biopsy results pending at the time of your discharge. Please request that your primary care M.D. follows up on these results.  Also Note the following: If you experience worsening of your admission symptoms, develop shortness of breath, life threatening emergency, suicidal or homicidal thoughts you must seek medical attention immediately by calling 911 or calling your MD immediately  if  symptoms less severe.  You must read complete  instructions/literature along with all the possible adverse reactions/side effects for all the Medicines you take and that have been prescribed to you. Take any new Medicines after you have completely understood and accpet all the possible adverse reactions/side effects.   Do not drive when taking Pain medications or sleeping medications (Benzodaizepines)  Do not take more than prescribed Pain, Sleep and Anxiety Medications. It is not advisable to combine anxiety,sleep and pain medications without talking with your primary care practitioner  Special Instructions: If you have smoked or chewed Tobacco  in the last 2 yrs please stop smoking, stop any regular Alcohol  and or any Recreational drug use.  Wear Seat belts while driving.  Please note: You were cared for by a hospitalist during your hospital stay. Once you are discharged, your primary care physician will handle any further medical issues. Please note that NO REFILLS for any discharge medications will be authorized once you are discharged, as it is imperative that you return to your primary care physician (or establish a relationship with a primary care physician if you do not have one) for your post hospital discharge needs so that they can reassess your need for medications and monitor your lab values.   Increase activity slowly   Complete by:  As directed      Allergies as of 11/22/2017   No Known Allergies     Medication List    STOP taking these medications   ibuprofen 200 MG tablet Commonly known as:  ADVIL,MOTRIN   ibuprofen 600 MG tablet Commonly known as:  ADVIL,MOTRIN   methocarbamol 500 MG tablet Commonly known as:  ROBAXIN   naproxen 500 MG tablet Commonly known as:  NAPROSYN   traMADol 50 MG tablet Commonly known as:  ULTRAM     TAKE these medications   aspirin 325 MG tablet Take 1 tablet (325 mg total) by mouth daily.   atorvastatin 80 MG tablet Commonly known as:  LIPITOR Take 1 tablet (80 mg total) by  mouth daily at 6 PM. What changed:    medication strength  how much to take   clopidogrel 75 MG tablet Commonly known as:  PLAVIX Take 1 tablet (75 mg total) by mouth daily.   nicotine 21 mg/24hr patch Commonly known as:  NICODERM CQ - dosed in mg/24 hours Place 1 patch (21 mg total) onto the skin daily.   NONFORMULARY OR COMPOUNDED ITEM Please evaluate and treat for outpatient physical therapy.  Diagnosis-CVA      Follow-up Information    Nada Libman, MD Follow up.   Specialties:  Vascular Surgery, Cardiology Why:  office will call Contact information: [address] [place] [address] [phone]        Micki Riley, MD Follow up.   Specialties:  Neurology, Radiology Why:  office will call Contact information: [address] [place] [address] [phone]        Primary Care MD. Schedule an appointment as soon as possible for a visit in 1 week(s).          No Known Allergies  Consultations:   neurology and vascular surgery  Other Procedures/Studies: Ct Angio Head W Or Wo Contrast  Result Date: 11/20/2017 CLINICAL DATA:  Initial evaluation for acute left-sided weakness, found to have watershed type right MCA infarcts on prior brain MRI. EXAM: CT ANGIOGRAPHY HEAD AND NECK TECHNIQUE: Multidetector CT imaging of the head and neck was performed using  the standard protocol during bolus administration of intravenous contrast. Multiplanar CT image reconstructions and MIPs were obtained to evaluate the vascular anatomy. Carotid stenosis measurements (when applicable) are obtained utilizing NASCET criteria, using the distal internal carotid diameter as the denominator. CONTRAST:  50mL ISOVUE-370 IOPAMIDOL (ISOVUE-370) INJECTION 76% COMPARISON:  Prior head CT and MRI from earlier the same day. FINDINGS: CTA NECK FINDINGS Aortic arch: Visualized aortic arch of normal caliber with normal branch pattern. Mild aortic atherosclerosis without  hemodynamically significant stenosis about the origin of the great vessels. Visualized subclavian arteries widely patent. Right carotid system: Right common carotid artery patent from its origin to the bifurcation without hemodynamically significant stenosis. Mixed atheromatous plaque at the proximal right ICA with severe near occlusive stenosis (series 7, image 191). A string sign is faintly visible on axial sequences. Stenosis begins approximately 4 mm distal to the bifurcation and measures approximately 1 cm in length. Right ICA widely patent distally to the skull base without additional stenosis or occlusion. Left carotid system: Scattered atheromatous plaque within the left common carotid artery without hemodynamically significant stenosis. A centric calcified plaque about the left bifurcation without significant stenosis. Left ICA patent distally to the skull base without stenosis, dissection, or occlusion. Vertebral arteries: Both of the vertebral arteries arise from the subclavian arteries. Focal plaque at the origin of the left vertebral artery with approximate 50% stenosis. Vertebral arteries otherwise widely patent within the neck. Skeleton: No acute osseous abnormality. No worrisome lytic or blastic osseous lesions. Multilevel degenerative disc disease and facet arthritis throughout the cervical spine, better evaluated on prior MRI. Other neck: Unremarkable. Upper chest: Unremarkable. Review of the MIP images confirms the above findings CTA HEAD FINDINGS Anterior circulation: Eccentric calcified plaque within the horizontal petrous right ICA with mild stenosis. Fairly heavy atheromatous burden throughout the cavernous/supraclinoid right ICA with moderate to advanced atheromatous stenosis of up to approximately 75%. Right ICA terminus widely patent. On the left, petrous left ICA widely patent without stenosis. Atheromatous plaque throughout the left carotid siphon with moderate multifocal narrowing of up  to approximately 50%. Left ICA terminus widely patent. A1 segments mildly irregular but patent bilaterally. Right A1 hypoplastic. Normal anterior communicating artery. Anterior cerebral arteries patent to their distal aspects without stenosis. No M1 stenosis or occlusion. No proximal M2 occlusion distal MCA branches patent bilaterally, although overall the right MCA and ACA are attenuated as compared to the left due to the proximal right ICA stenosis. Posterior circulation: Atheromatous irregularity within the distal right V4 segment without high-grade stenosis. Left V4 widely patent to the vertebrobasilar junction. Posterior inferior cerebral arteries patent bilaterally. Multifocal atheromatous irregularity throughout the basilar artery. Moderate stenoses of up to 50% seen involving the proximal and mid basilar artery. Superior cerebral arteries patent bilaterally. Left PCA supplied via the basilar. Fetal type origin of the right PCA. PCAs patent to their distal aspects without high-grade stenosis. Venous sinuses: Grossly patent, although not well assessed due to arterial timing of the contrast bolus. Anatomic variants: Fetal type origin of the right PCA. No intracranial aneurysm. Delayed phase: Not performed. Review of the MIP images confirms the above findings IMPRESSION: 1. 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 Secondary downstream attenuation with decreased flow throughout the right ACA and MCA territories as compared to the left. 2. Prominent carotid siphon atherosclerotic change with associated stenoses of up to 75% on the right and 50% on the left.  3. 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 4. 50% stenosis at the origin of the left vertebral artery. 5. Fetal type right PCA. Electronically Signed   By: Rise Mu M.D.   On:  11/20/2017 20:05   Ct Head Wo Contrast  Result Date: 11/20/2017 CLINICAL DATA:  Left-sided weakness the past few months. Numbness and tingling of the extremities. EXAM: CT HEAD WITHOUT CONTRAST TECHNIQUE: Contiguous axial images were obtained from the base of the skull through the vertex without intravenous contrast. COMPARISON:  CT head report dated January 26, 1999. FINDINGS: Brain: Focal loss of the normal gray-white matter differentiation in the right pre and postcentral gyri. No evidence of acute hemorrhage, hydrocephalus, extra-axial collection or mass lesion/mass effect. Old lacunar infarcts in the bilateral basal ganglia and right pons. Vascular: Atherosclerotic vascular calcification of the carotid siphons. No hyperdense vessel. Skull: Normal. Negative for fracture or focal lesion. Sinuses/Orbits: No acute finding. Other: None. IMPRESSION: 1. Loss of the normal gray-white matter differentiation in the right pre and postcentral gyri, suspicious for subacute infarct given clinical history. 2. Chronic appearing lacunar infarcts in the bilateral basal ganglia and right pons. Electronically Signed   By: Obie Dredge M.D.   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 EXAM: CT ANGIOGRAPHY HEAD AND NECK TECHNIQUE: Multidetector CT imaging of the head and neck was performed using the standard protocol during bolus administration of intravenous contrast. Multiplanar CT image reconstructions and MIPs were obtained to evaluate the vascular anatomy. Carotid stenosis measurements (when applicable) are obtained utilizing NASCET criteria, using the distal internal carotid diameter as the denominator. CONTRAST:  50mL ISOVUE-370 IOPAMIDOL (ISOVUE-370) INJECTION 76% COMPARISON:  Prior head CT and MRI from earlier the same day. FINDINGS: CTA NECK  FINDINGS Aortic arch: Visualized aortic arch of normal caliber with normal branch pattern. Mild aortic atherosclerosis without hemodynamically significant stenosis about the origin of the great vessels. Visualized subclavian arteries widely patent. Right carotid system: Right common carotid artery patent from its origin to the bifurcation without hemodynamically significant stenosis. Mixed atheromatous plaque at the proximal right ICA with severe near occlusive stenosis (series 7, image 191). A string sign is faintly visible on axial sequences. Stenosis begins approximately 4 mm distal to the bifurcation and measures approximately 1 cm in length. Right ICA widely patent distally to the skull base without additional stenosis or occlusion. Left carotid system: Scattered atheromatous plaque within the left common carotid artery without hemodynamically significant stenosis. A centric calcified plaque about the left bifurcation without significant stenosis. Left ICA patent distally to the skull base without stenosis, dissection, or occlusion. Vertebral arteries: Both of the vertebral arteries arise from the subclavian arteries. Focal plaque at the origin of the left vertebral artery with approximate 50% stenosis. Vertebral arteries otherwise widely patent within the neck. Skeleton: No acute osseous abnormality. No worrisome lytic or blastic osseous lesions. Multilevel degenerative disc disease and facet arthritis throughout the cervical spine, better evaluated on prior MRI. Other neck: Unremarkable. Upper chest: Unremarkable. Review of the MIP images confirms the above findings CTA HEAD FINDINGS Anterior circulation: Eccentric calcified plaque within the horizontal petrous right ICA with mild stenosis. Fairly heavy atheromatous burden throughout the cavernous/supraclinoid right ICA with moderate to advanced atheromatous stenosis of up to approximately 75%. Right ICA terminus widely  patent. On the left, petrous left ICA  widely patent without stenosis. Atheromatous plaque throughout the left carotid siphon with moderate multifocal narrowing of up to approximately 50%. Left ICA terminus widely patent. A1 segments mildly irregular but patent bilaterally. Right A1 hypoplastic. Normal anterior communicating artery. Anterior cerebral arteries patent to their distal aspects without stenosis. No M1 stenosis or occlusion. No proximal M2 occlusion distal MCA branches patent bilaterally, although overall the right MCA and ACA are attenuated as compared to the left due to the proximal right ICA stenosis. Posterior circulation: Atheromatous irregularity within the distal right V4 segment without high-grade stenosis. Left V4 widely patent to the vertebrobasilar junction. Posterior inferior cerebral arteries patent bilaterally. Multifocal atheromatous irregularity throughout the basilar artery. Moderate stenoses of up to 50% seen involving the proximal and mid basilar artery. Superior cerebral arteries patent bilaterally. Left PCA supplied via the basilar. Fetal type origin of the right PCA. PCAs patent to their distal aspects without high-grade stenosis. Venous sinuses: Grossly patent, although not well assessed due to arterial timing of the contrast bolus. Anatomic variants: Fetal type origin of the right PCA. No intracranial aneurysm. Delayed phase: Not performed. Review of the MIP images confirms the above findings IMPRESSION: 1. 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 Secondary downstream attenuation with decreased flow throughout the right ACA and MCA territories as compared to the left. 2. Prominent carotid siphon atherosclerotic change with associated stenoses of up to 75% on the right and 50% on the left. 3. 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 4. 50% stenosis at  the origin of the left vertebral artery. 5. Fetal type right PCA. Electronically Signed   By: Rise Mu M.D.   On: 11/20/2017 20:05   Mr Maxine Glenn Head Wo Contrast  Result Date: 11/20/2017 CLINICAL DATA:  Left-sided weakness for several months. Numbness and tingling in the extremities. EXAM: MRI HEAD WITHOUT CONTRAST MRA HEAD WITHOUT CONTRAST MRI CERVICAL SPINE WITHOUT CONTRAST TECHNIQUE: Multiplanar, multiecho pulse sequences of the brain and surrounding structures, and cervical spine, to include the craniocervical junction and cervicothoracic junction, were obtained without intravenous contrast. Angiographic images of the head were obtained using MRA technique without contrast. COMPARISON:  Head CT 11/20/2017 FINDINGS: MRI HEAD FINDINGS Brain: Small scattered acute to early subacute cortical and white matter infarcts are present in the right cerebral hemisphere which appear to largely be along superficial and deep watershed zones. The largest confluent area of infarction involves 2 cm of right frontoparietal cortex at the vertex, corresponding to the abnormality described on CT. Multiple sagittally oriented infarcts are present in the right centrum semiovale. Small right parieto-occipital cortical infarcts are noted in the posterior MCA/watershed territory. There are a couple of punctate acute right temporal lobe cortical infarcts, and there is a small acute infarct involving the right basal ganglia and anterior limb of internal capsule. No acute left cerebral hemispheric or posterior fossa infarcts are identified. Chronic microhemorrhages are noted in the posterior left frontal lobe at the level of the corona radiata and in the right cerebellum. There are small cortical infarcts in the posterior right temporal lobe and lateral right occipital lobe as well as small white matter infarcts in the right centrum semiovale which have a more late subacute to chronic appearance. Chronic lacunar infarcts are also  noted in the basal ganglia bilaterally and in the right pons. The ventricles  are normal in size. No mass, midline shift, or extra-axial fluid collection is present. Vascular: Abnormal appearance of the distal right internal carotid artery with some is centric. Skull and upper cervical spine: Unremarkable bone marrow signal. Sinuses/Orbits: Unremarkable orbits. Minimal mucosal thickening in the frontal, ethmoid, and right sphenoid sinuses. Clear mastoid air cells. Other: None. MRA HEAD FINDINGS The visualized distal vertebral arteries are patent to the basilar and codominant. There is mild left V4 stenosis at the vertebrobasilar junction. Patent PICA, AICA, and SCA origins are visualized bilaterally. The basilar artery is patent with motion artifact through its proximal portion and no evidence of significant stenosis. There is a predominantly fetal type origin of the right PCA, possibly with a diminutive and diseased right P1 segment. The large right posterior communicating artery is widely patent, as is the left PCA. The included distal cervical and intracranial segments of both internal carotid arteries are patent to the carotid termini. However, the right ICA is diffusely small in caliber compared to the left. There is abnormal signal diffusely surrounding the right ICA as it enters the skull base as well as throughout the petrous portion. Flow related enhancement is mildly diminished throughout the distal right petrous ICA extending to the petrous-cavernous junction, and there is superimposed mild additional narrowing throughout this region. The ACAs and MCAs are patent without evidence of proximal branch occlusion or significant proximal stenosis, although the right ACA and right MCA appears slightly attenuated with less robust flow diffusely compared to the contralateral side. No aneurysm is identified. MRI CERVICAL SPINE FINDINGS The study is mildly motion degraded. Alignment: Cervical spine straightening.  No  significant listhesis. Vertebrae: No fracture, suspicious osseous lesion, or significant marrow edema. Type 2 degenerative endplate changes from C5-T1. Cord: Normal signal and morphology. Posterior Fossa, vertebral arteries, paraspinal tissues: Preserved vertebral artery flow voids. Abnormal appearance of the proximal right ICA flow void. Unremarkable paraspinal soft tissues. Disc levels: C2-3: Asymmetric right facet arthrosis with likely ankylosis across the joint. Mild uncovertebral spurring. Mild right neural foraminal stenosis. No spinal stenosis. C3-4: Moderate disc space narrowing. Disc bulging, uncovertebral spurring, and mild facet arthrosis result in mild spinal stenosis and moderate to severe bilateral neural foraminal stenosis. C4-5: Mild disc bulging, mild uncovertebral spurring, and mild facet arthrosis result in mild-to-moderate right and moderate left neural foraminal stenosis and borderline spinal stenosis. C5-6: Right greater than left uncovertebral hypertrophy results in severe right and moderate to severe left neural foraminal stenosis. Minimal disc bulging does not result in significant spinal stenosis. C6-7: Uncovertebral spurring results in moderate to severe bilateral neural foraminal stenosis. No spinal stenosis. C7-T1: Uncovertebral spurring and mild disc bulging result in moderate to severe bilateral neural foraminal stenosis without spinal stenosis. T1-2: Bilateral foraminal disc protrusions result in moderate bilateral neural foraminal stenosis without spinal stenosis. IMPRESSION: 1. 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 2. 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 3. Diffusely abnormal appearance of the distal cervical and proximal intracranial portions of the right ICA including mild diffuse narrowing. Head and neck CTA is recommended to further  evaluate for a possible dissection or flow limiting proximal stenosis. 4. Diffuse cervical spondylosis with moderate to severe multilevel neural foraminal stenosis as above. 5. Mild spinal stenosis at C3-4. Electronically Signed   By: Sebastian Ache M.D.   On: 11/20/2017 16:21   Mr Brain Wo Contrast  Result Date: 11/20/2017 CLINICAL DATA:  Left-sided weakness for several months. Numbness and tingling in the extremities. EXAM: MRI HEAD WITHOUT CONTRAST MRA HEAD WITHOUT CONTRAST MRI CERVICAL SPINE WITHOUT CONTRAST TECHNIQUE: Multiplanar, multiecho pulse sequences of the brain and surrounding structures, and cervical spine, to include the craniocervical junction and cervicothoracic junction, were obtained without intravenous contrast. Angiographic images of the head were obtained using MRA technique without contrast. COMPARISON:  Head CT 11/20/2017 FINDINGS: MRI HEAD FINDINGS Brain: Small scattered acute to early subacute cortical and white matter infarcts are present in the right cerebral hemisphere which appear to largely be along superficial and deep watershed zones. The largest confluent area of infarction involves 2 cm of right frontoparietal cortex at the vertex, corresponding to the abnormality described on CT. Multiple sagittally oriented infarcts are present in the right centrum semiovale. Small right parieto-occipital cortical infarcts are noted in the posterior MCA/watershed territory. There are a couple of punctate acute right temporal lobe cortical infarcts, and there is a small acute infarct involving the right basal ganglia and anterior limb of internal capsule. No acute left cerebral hemispheric or posterior fossa infarcts are identified. Chronic microhemorrhages are noted in the posterior left frontal lobe at the level of the corona radiata and in the right cerebellum. There are small cortical infarcts in the posterior right temporal lobe and lateral right occipital lobe as well as small white matter  infarcts in the right centrum semiovale which have a more late subacute to chronic appearance. Chronic lacunar infarcts are also noted in the basal ganglia bilaterally and in the right pons. The ventricles are normal in size. No mass, midline shift, or extra-axial fluid collection is present. Vascular: Abnormal appearance of the distal right internal carotid artery with some is centric. Skull and upper cervical spine: Unremarkable bone marrow signal. Sinuses/Orbits: Unremarkable orbits. Minimal mucosal thickening in the frontal, ethmoid, and right sphenoid sinuses. Clear mastoid air cells. Other: None. MRA HEAD FINDINGS The visualized distal vertebral arteries are patent to the basilar and codominant. There is mild left V4 stenosis at the vertebrobasilar junction. Patent PICA, AICA, and SCA origins are visualized bilaterally. The basilar artery is patent with motion artifact through its proximal portion and no evidence of significant stenosis. There is a predominantly fetal type origin of the right PCA, possibly with a diminutive and diseased right P1 segment. The large right posterior communicating artery is widely patent, as is the left PCA. The included distal cervical and intracranial segments of both internal carotid arteries are patent to the carotid termini. However, the right ICA is diffusely small in caliber compared to the left. There is abnormal signal diffusely surrounding the right ICA as it enters the skull base as well as throughout the petrous portion. Flow related enhancement is mildly diminished throughout the distal right petrous ICA extending to the petrous-cavernous junction, and there is superimposed mild additional narrowing throughout this region. The ACAs and MCAs are patent without evidence of proximal branch occlusion or significant proximal stenosis, although the right ACA and right MCA appears slightly attenuated with less robust flow diffusely compared to the contralateral side. No  aneurysm is identified. MRI CERVICAL SPINE FINDINGS The study is mildly motion degraded. Alignment: Cervical spine straightening.  No significant listhesis. Vertebrae: No fracture, suspicious osseous lesion, or significant marrow edema. Type 2 degenerative endplate changes from C5-T1. Cord: Normal signal and morphology. Posterior Fossa, vertebral arteries, paraspinal tissues: Preserved vertebral artery flow voids. Abnormal appearance of the proximal right ICA flow void. Unremarkable paraspinal soft tissues. Disc levels: C2-3: Asymmetric  right facet arthrosis with likely ankylosis across the joint. Mild uncovertebral spurring. Mild right neural foraminal stenosis. No spinal stenosis. C3-4: Moderate disc space narrowing. Disc bulging, uncovertebral spurring, and mild facet arthrosis result in mild spinal stenosis and moderate to severe bilateral neural foraminal stenosis. C4-5: Mild disc bulging, mild uncovertebral spurring, and mild facet arthrosis result in mild-to-moderate right and moderate left neural foraminal stenosis and borderline spinal stenosis. C5-6: Right greater than left uncovertebral hypertrophy results in severe right and moderate to severe left neural foraminal stenosis. Minimal disc bulging does not result in significant spinal stenosis. C6-7: Uncovertebral spurring results in moderate to severe bilateral neural foraminal stenosis. No spinal stenosis. C7-T1: Uncovertebral spurring and mild disc bulging result in moderate to severe bilateral neural foraminal stenosis without spinal stenosis. T1-2: Bilateral foraminal disc protrusions result in moderate bilateral neural foraminal stenosis without spinal stenosis. IMPRESSION: 1. 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 2. 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 3. Diffusely abnormal appearance of the distal  cervical and proximal intracranial portions of the right ICA including mild diffuse narrowing. Head and neck CTA is recommended to further evaluate for a possible dissection or flow limiting proximal stenosis. 4. Diffuse cervical spondylosis with moderate to severe multilevel neural foraminal stenosis as above. 5. Mild spinal stenosis at C3-4. Electronically Signed   By: Sebastian Ache M.D.   On: 11/20/2017 16:21   Mr Cervical Spine Wo Contrast  Result Date: 11/20/2017 CLINICAL DATA:  Left-sided weakness for several months. Numbness and tingling in the extremities. EXAM: MRI HEAD WITHOUT CONTRAST MRA HEAD WITHOUT CONTRAST MRI CERVICAL SPINE WITHOUT CONTRAST TECHNIQUE: Multiplanar, multiecho pulse sequences of the brain and surrounding structures, and cervical spine, to include the craniocervical junction and cervicothoracic junction, were obtained without intravenous contrast. Angiographic images of the head were obtained using MRA technique without contrast. COMPARISON:  Head CT 11/20/2017 FINDINGS: MRI HEAD FINDINGS Brain: Small scattered acute to early subacute cortical and white matter infarcts are present in the right cerebral hemisphere which appear to largely be along superficial and deep watershed zones. The largest confluent area of infarction involves 2 cm of right frontoparietal cortex at the vertex, corresponding to the abnormality described on CT. Multiple sagittally oriented infarcts are present in the right centrum semiovale. Small right parieto-occipital cortical infarcts are noted in the posterior MCA/watershed territory. There are a couple of punctate acute right temporal lobe cortical infarcts, and there is a small acute infarct involving the right basal ganglia and anterior limb of internal capsule. No acute left cerebral hemispheric or posterior fossa infarcts are identified. Chronic microhemorrhages are noted in the posterior left frontal lobe at the level of the corona radiata and in the  right cerebellum. There are small cortical infarcts in the posterior right temporal lobe and lateral right occipital lobe as well as small white matter infarcts in the right centrum semiovale which have a more late subacute to chronic appearance. Chronic lacunar infarcts are also noted in the basal ganglia bilaterally and in the right pons. The ventricles are normal in size. No mass, midline shift, or extra-axial fluid collection is present. Vascular: Abnormal appearance of the distal right internal carotid artery with some is centric. Skull and upper cervical spine: Unremarkable bone marrow signal. Sinuses/Orbits: Unremarkable orbits. Minimal mucosal thickening in the frontal, ethmoid, and right sphenoid sinuses. Clear mastoid air cells. Other: None. MRA HEAD FINDINGS The visualized distal  vertebral arteries are patent to the basilar and codominant. There is mild left V4 stenosis at the vertebrobasilar junction. Patent PICA, AICA, and SCA origins are visualized bilaterally. The basilar artery is patent with motion artifact through its proximal portion and no evidence of significant stenosis. There is a predominantly fetal type origin of the right PCA, possibly with a diminutive and diseased right P1 segment. The large right posterior communicating artery is widely patent, as is the left PCA. The included distal cervical and intracranial segments of both internal carotid arteries are patent to the carotid termini. However, the right ICA is diffusely small in caliber compared to the left. There is abnormal signal diffusely surrounding the right ICA as it enters the skull base as well as throughout the petrous portion. Flow related enhancement is mildly diminished throughout the distal right petrous ICA extending to the petrous-cavernous junction, and there is superimposed mild additional narrowing throughout this region. The ACAs and MCAs are patent without evidence of proximal branch occlusion or significant  proximal stenosis, although the right ACA and right MCA appears slightly attenuated with less robust flow diffusely compared to the contralateral side. No aneurysm is identified. MRI CERVICAL SPINE FINDINGS The study is mildly motion degraded. Alignment: Cervical spine straightening.  No significant listhesis. Vertebrae: No fracture, suspicious osseous lesion, or significant marrow edema. Type 2 degenerative endplate changes from C5-T1. Cord: Normal signal and morphology. Posterior Fossa, vertebral arteries, paraspinal tissues: Preserved vertebral artery flow voids. Abnormal appearance of the proximal right ICA flow void. Unremarkable paraspinal soft tissues. Disc levels: C2-3: Asymmetric right facet arthrosis with likely ankylosis across the joint. Mild uncovertebral spurring. Mild right neural foraminal stenosis. No spinal stenosis. C3-4: Moderate disc space narrowing. Disc bulging, uncovertebral spurring, and mild facet arthrosis result in mild spinal stenosis and moderate to severe bilateral neural foraminal stenosis. C4-5: Mild disc bulging, mild uncovertebral spurring, and mild facet arthrosis result in mild-to-moderate right and moderate left neural foraminal stenosis and borderline spinal stenosis. C5-6: Right greater than left uncovertebral hypertrophy results in severe right and moderate to severe left neural foraminal stenosis. Minimal disc bulging does not result in significant spinal stenosis. C6-7: Uncovertebral spurring results in moderate to severe bilateral neural foraminal stenosis. No spinal stenosis. C7-T1: Uncovertebral spurring and mild disc bulging result in moderate to severe bilateral neural foraminal stenosis without spinal stenosis. T1-2: Bilateral foraminal disc protrusions result in moderate bilateral neural foraminal stenosis without spinal stenosis. IMPRESSION: 1. Multiple small infarcts throughout the right cerebral hemisphere primarily in a watershed distribution and varying in age  from acute to late subacute/chronic. 2. 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 3. Diffusely abnormal appearance of the distal cervical and proximal intracranial portions of the right ICA including mild diffuse narrowing. Head and neck CTA is recommended to further evaluate for a possible dissection or flow limiting proximal stenosis. 4. Diffuse cervical spondylosis with moderate to severe multilevel neural foraminal stenosis as above. 5. Mild spinal stenosis at C3-4. Electronically Signed   By: Sebastian Ache M.D.   On: 11/20/2017 16:21      TODAY-DAY OF DISCHARGE:  Subjective:   [name] today has no headache,no chest abdominal pain,no new weakness tingling or numbness, feels much better wants to go home today.   Objective:   Blood pressure 136/83, pulse 79, temperature 98.4 F (36.9 C), temperature source Oral, resp. rate 18, height 6' (1.829 m), weight 75.8 kg (167 lb 1.7 oz), SpO2 100 %.  Intake/Output Summary (Last 24 hours) at  11/22/2017 1030 Last data filed at 11/21/2017 1754 Gross per 24 hour  Intake 360 ml  Output -  Net 360 ml   Filed Weights   11/20/17 1949 11/20/17 2001  Weight: 75.8 kg (167 lb 1.7 oz) 75.8 kg (167 lb 1.7 oz)    Exam: Awake Alert, Oriented *3, No new F.N deficits, Normal affect [place].AT,PERRAL Supple Neck,No JVD, No cervical lymphadenopathy appriciated.  Symmetrical Chest wall movement, Good air movement bilaterally, CTAB RRR,No Gallops,Rubs or new Murmurs, No Parasternal Heave +ve B.Sounds, Abd Soft, Non tender, No organomegaly appriciated, No rebound -guarding or rigidity. No Cyanosis, Clubbing or edema, No new Rash or bruise   PERTINENT RADIOLOGIC STUDIES: Ct Angio Head W Or Wo Contrast  Result Date: 11/20/2017 CLINICAL DATA:  Initial evaluation for acute left-sided weakness, found to have watershed type right MCA infarcts on prior brain MRI. EXAM: CT ANGIOGRAPHY HEAD AND NECK TECHNIQUE: Multidetector CT imaging of the  head and neck was performed using the standard protocol during bolus administration of intravenous contrast. Multiplanar CT image reconstructions and MIPs were obtained to evaluate the vascular anatomy. Carotid stenosis measurements (when applicable) are obtained utilizing NASCET criteria, using the distal internal carotid diameter as the denominator. CONTRAST:  50mL ISOVUE-370 IOPAMIDOL (ISOVUE-370) INJECTION 76% COMPARISON:  Prior head CT and MRI from earlier the same day. FINDINGS: CTA NECK FINDINGS Aortic arch: Visualized aortic arch of normal caliber with normal branch pattern. Mild aortic atherosclerosis without hemodynamically significant stenosis about the origin of the great vessels. Visualized subclavian arteries widely patent. Right carotid system: Right common carotid artery patent from its origin to the bifurcation without hemodynamically significant stenosis. Mixed atheromatous plaque at the proximal right ICA with severe near occlusive stenosis (series 7, image 191). A string sign is faintly visible on axial sequences. Stenosis begins approximately 4 mm distal to the bifurcation and measures approximately 1 cm in length. Right ICA widely patent distally to the skull base without additional stenosis or occlusion. Left carotid system: Scattered atheromatous plaque within the left common carotid artery without hemodynamically significant stenosis. A centric calcified plaque about the left bifurcation without significant stenosis. Left ICA patent distally to the skull base without stenosis, dissection, or occlusion. Vertebral arteries: Both of the vertebral arteries arise from the subclavian arteries. Focal plaque at the origin of the left vertebral artery with approximate 50% stenosis. Vertebral arteries otherwise widely patent within the neck. Skeleton: No acute osseous abnormality. No worrisome lytic or blastic osseous lesions. Multilevel degenerative disc disease and facet arthritis throughout the  cervical spine, better evaluated on prior MRI. Other neck: Unremarkable. Upper chest: Unremarkable. Review of the MIP images confirms the above findings CTA HEAD FINDINGS Anterior circulation: Eccentric calcified plaque within the horizontal petrous right ICA with mild stenosis. Fairly heavy atheromatous burden throughout the cavernous/supraclinoid right ICA with moderate to advanced atheromatous stenosis of up to approximately 75%. Right ICA terminus widely patent. On the left, petrous left ICA widely patent without stenosis. Atheromatous plaque throughout the left carotid siphon with moderate multifocal narrowing of up to approximately 50%. Left ICA terminus widely patent. A1 segments mildly irregular but patent bilaterally. Right A1 hypoplastic. Normal anterior communicating artery. Anterior cerebral arteries patent to their distal aspects without stenosis. No M1 stenosis or occlusion. No proximal M2 occlusion distal MCA branches patent bilaterally, although overall the right MCA and ACA are attenuated as compared to the left due to the proximal right ICA stenosis. Posterior circulation: Atheromatous irregularity within the distal right V4 segment without high-grade stenosis.  Left V4 widely patent to the vertebrobasilar junction. Posterior inferior cerebral arteries patent bilaterally. Multifocal atheromatous irregularity throughout the basilar artery. Moderate stenoses of up to 50% seen involving the proximal and mid basilar artery. Superior cerebral arteries patent bilaterally. Left PCA supplied via the basilar. Fetal type origin of the right PCA. PCAs patent to their distal aspects without high-grade stenosis. Venous sinuses: Grossly patent, although not well assessed due to arterial timing of the contrast bolus. Anatomic variants: Fetal type origin of the right PCA. No intracranial aneurysm. Delayed phase: Not performed. Review of the MIP images confirms the above findings IMPRESSION: 1. 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 Secondary downstream attenuation with decreased flow throughout the right ACA and MCA territories as compared to the left. 2. Prominent carotid siphon atherosclerotic change with associated stenoses of up to 75% on the right and 50% on the left. 3. 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 4. 50% stenosis at the origin of the left vertebral artery. 5. Fetal type right PCA. Electronically Signed   By: Rise Mu M.D.   On: 11/20/2017 20:05   Ct Head Wo Contrast  Result Date: 11/20/2017 CLINICAL DATA:  Left-sided weakness the past few months. Numbness and tingling of the extremities. EXAM: CT HEAD WITHOUT CONTRAST TECHNIQUE: Contiguous axial images were obtained from the base of the skull through the vertex without intravenous contrast. COMPARISON:  CT head report dated January 26, 1999. FINDINGS: Brain: Focal loss of the normal gray-white matter differentiation in the right pre and postcentral gyri. No evidence of acute hemorrhage, hydrocephalus, extra-axial collection or mass lesion/mass effect. Old lacunar infarcts in the bilateral basal ganglia and right pons. Vascular: Atherosclerotic vascular calcification of the carotid siphons. No hyperdense vessel. Skull: Normal. Negative for fracture or focal lesion. Sinuses/Orbits: No acute finding. Other: None. IMPRESSION: 1. Loss of the normal gray-white matter differentiation in the right pre and postcentral gyri, suspicious for subacute infarct given clinical history. 2. Chronic appearing lacunar infarcts in the bilateral basal ganglia and right pons. Electronically Signed   By: Obie Dredge M.D.   On: 11/20/2017 12:41   Ct Angio Neck W Or Wo Contrast  Result Date: 11/20/2017 CLINICAL DATA:  Initial evaluation for acute left-sided weakness, found to have watershed  type right MCA infarcts on prior brain MRI. EXAM: CT ANGIOGRAPHY HEAD AND NECK TECHNIQUE: Multidetector CT imaging of the head and neck was performed using the standard protocol during bolus administration of intravenous contrast. Multiplanar CT image reconstructions and MIPs were obtained to evaluate the vascular anatomy. Carotid stenosis measurements (when applicable) are obtained utilizing NASCET criteria, using the distal internal carotid diameter as the denominator. CONTRAST:  50mL ISOVUE-370 IOPAMIDOL (ISOVUE-370) INJECTION 76% COMPARISON:  Prior head CT and MRI from earlier the same day. FINDINGS: CTA NECK FINDINGS Aortic arch: Visualized aortic arch of normal caliber with normal branch pattern. Mild aortic atherosclerosis without hemodynamically significant stenosis about the origin of the great vessels. Visualized subclavian arteries widely patent. Right carotid system: Right common carotid artery patent from its origin to the bifurcation without hemodynamically significant stenosis. Mixed atheromatous plaque at the proximal right ICA with severe near occlusive stenosis (series 7, image 191). A string sign is faintly visible on axial sequences. Stenosis begins approximately 4 mm distal to the bifurcation and measures approximately 1 cm in length. Right ICA widely patent distally to  the skull base without additional stenosis or occlusion. Left carotid system: Scattered atheromatous plaque within the left common carotid artery without hemodynamically significant stenosis. A centric calcified plaque about the left bifurcation without significant stenosis. Left ICA patent distally to the skull base without stenosis, dissection, or occlusion. Vertebral arteries: Both of the vertebral arteries arise from the subclavian arteries. Focal plaque at the origin of the left vertebral artery with approximate 50% stenosis. Vertebral arteries otherwise widely patent within the neck. Skeleton: No acute osseous abnormality.  No worrisome lytic or blastic osseous lesions. Multilevel degenerative disc disease and facet arthritis throughout the cervical spine, better evaluated on prior MRI. Other neck: Unremarkable. Upper chest: Unremarkable. Review of the MIP images confirms the above findings CTA HEAD FINDINGS Anterior circulation: Eccentric calcified plaque within the horizontal petrous right ICA with mild stenosis. Fairly heavy atheromatous burden throughout the cavernous/supraclinoid right ICA with moderate to advanced atheromatous stenosis of up to approximately 75%. Right ICA terminus widely patent. On the left, petrous left ICA widely patent without stenosis. Atheromatous plaque throughout the left carotid siphon with moderate multifocal narrowing of up to approximately 50%. Left ICA terminus widely patent. A1 segments mildly irregular but patent bilaterally. Right A1 hypoplastic. Normal anterior communicating artery. Anterior cerebral arteries patent to their distal aspects without stenosis. No M1 stenosis or occlusion. No proximal M2 occlusion distal MCA branches patent bilaterally, although overall the right MCA and ACA are attenuated as compared to the left due to the proximal right ICA stenosis. Posterior circulation: Atheromatous irregularity within the distal right V4 segment without high-grade stenosis. Left V4 widely patent to the vertebrobasilar junction. Posterior inferior cerebral arteries patent bilaterally. Multifocal atheromatous irregularity throughout the basilar artery. Moderate stenoses of up to 50% seen involving the proximal and mid basilar artery. Superior cerebral arteries patent bilaterally. Left PCA supplied via the basilar. Fetal type origin of the right PCA. PCAs patent to their distal aspects without high-grade stenosis. Venous sinuses: Grossly patent, although not well assessed due to arterial timing of the contrast bolus. Anatomic variants: Fetal type origin of the right PCA. No intracranial aneurysm.  Delayed phase: Not performed. Review of the MIP images confirms the above findings IMPRESSION: 1. 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 Secondary downstream attenuation with decreased flow throughout the right ACA and MCA territories as compared to the left. 2. Prominent carotid siphon atherosclerotic change with associated stenoses of up to 75% on the right and 50% on the left. 3. 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 4. 50% stenosis at the origin of the left vertebral artery. 5. Fetal type right PCA. Electronically Signed   By: Rise Mu M.D.   On: 11/20/2017 20:05   Mr Maxine Glenn Head Wo Contrast  Result Date: 11/20/2017 CLINICAL DATA:  Left-sided weakness for several months. Numbness and tingling in the extremities. EXAM: MRI HEAD WITHOUT CONTRAST MRA HEAD WITHOUT CONTRAST MRI CERVICAL SPINE WITHOUT CONTRAST TECHNIQUE: Multiplanar, multiecho pulse sequences of the brain and surrounding structures, and cervical spine, to include the craniocervical junction and cervicothoracic junction, were obtained without intravenous contrast. Angiographic images of the head were obtained using MRA technique without contrast. COMPARISON:  Head CT 11/20/2017 FINDINGS: MRI HEAD FINDINGS Brain: Small scattered acute to early subacute cortical and white matter infarcts are present in the right cerebral hemisphere which appear to largely be along superficial and deep watershed  zones. The largest confluent area of infarction involves 2 cm of right frontoparietal cortex at the vertex, corresponding to the abnormality described on CT. Multiple sagittally oriented infarcts are present in the right centrum semiovale. Small right parieto-occipital cortical infarcts are noted in the posterior MCA/watershed territory. There are a couple of punctate acute  right temporal lobe cortical infarcts, and there is a small acute infarct involving the right basal ganglia and anterior limb of internal capsule. No acute left cerebral hemispheric or posterior fossa infarcts are identified. Chronic microhemorrhages are noted in the posterior left frontal lobe at the level of the corona radiata and in the right cerebellum. There are small cortical infarcts in the posterior right temporal lobe and lateral right occipital lobe as well as small white matter infarcts in the right centrum semiovale which have a more late subacute to chronic appearance. Chronic lacunar infarcts are also noted in the basal ganglia bilaterally and in the right pons. The ventricles are normal in size. No mass, midline shift, or extra-axial fluid collection is present. Vascular: Abnormal appearance of the distal right internal carotid artery with some is centric. Skull and upper cervical spine: Unremarkable bone marrow signal. Sinuses/Orbits: Unremarkable orbits. Minimal mucosal thickening in the frontal, ethmoid, and right sphenoid sinuses. Clear mastoid air cells. Other: None. MRA HEAD FINDINGS The visualized distal vertebral arteries are patent to the basilar and codominant. There is mild left V4 stenosis at the vertebrobasilar junction. Patent PICA, AICA, and SCA origins are visualized bilaterally. The basilar artery is patent with motion artifact through its proximal portion and no evidence of significant stenosis. There is a predominantly fetal type origin of the right PCA, possibly with a diminutive and diseased right P1 segment. The large right posterior communicating artery is widely patent, as is the left PCA. The included distal cervical and intracranial segments of both internal carotid arteries are patent to the carotid termini. However, the right ICA is diffusely small in caliber compared to the left. There is abnormal signal diffusely surrounding the right ICA as it enters the skull base as  well as throughout the petrous portion. Flow related enhancement is mildly diminished throughout the distal right petrous ICA extending to the petrous-cavernous junction, and there is superimposed mild additional narrowing throughout this region. The ACAs and MCAs are patent without evidence of proximal branch occlusion or significant proximal stenosis, although the right ACA and right MCA appears slightly attenuated with less robust flow diffusely compared to the contralateral side. No aneurysm is identified. MRI CERVICAL SPINE FINDINGS The study is mildly motion degraded. Alignment: Cervical spine straightening.  No significant listhesis. Vertebrae: No fracture, suspicious osseous lesion, or significant marrow edema. Type 2 degenerative endplate changes from C5-T1. Cord: Normal signal and morphology. Posterior Fossa, vertebral arteries, paraspinal tissues: Preserved vertebral artery flow voids. Abnormal appearance of the proximal right ICA flow void. Unremarkable paraspinal soft tissues. Disc levels: C2-3: Asymmetric right facet arthrosis with likely ankylosis across the joint. Mild uncovertebral spurring. Mild right neural foraminal stenosis. No spinal stenosis. C3-4: Moderate disc space narrowing. Disc bulging, uncovertebral spurring, and mild facet arthrosis result in mild spinal stenosis and moderate to severe bilateral neural foraminal stenosis. C4-5: Mild disc bulging, mild uncovertebral spurring, and mild facet arthrosis result in mild-to-moderate right and moderate left neural foraminal stenosis and borderline spinal stenosis. C5-6: Right greater than left uncovertebral hypertrophy results in severe right and moderate to severe left neural foraminal stenosis. Minimal disc bulging does not result in significant spinal stenosis. C6-7: Uncovertebral  spurring results in moderate to severe bilateral neural foraminal stenosis. No spinal stenosis. C7-T1: Uncovertebral spurring and mild disc bulging result in  moderate to severe bilateral neural foraminal stenosis without spinal stenosis. T1-2: Bilateral foraminal disc protrusions result in moderate bilateral neural foraminal stenosis without spinal stenosis. IMPRESSION: 1. 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 2. 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 3. Diffusely abnormal appearance of the distal cervical and proximal intracranial portions of the right ICA including mild diffuse narrowing. Head and neck CTA is recommended to further evaluate for a possible dissection or flow limiting proximal stenosis. 4. Diffuse cervical spondylosis with moderate to severe multilevel neural foraminal stenosis as above. 5. Mild spinal stenosis at C3-4. Electronically Signed   By: Sebastian Ache M.D.   On: 11/20/2017 16:21   Mr Brain Wo Contrast  Result Date: 11/20/2017 CLINICAL DATA:  Left-sided weakness for several months. Numbness and tingling in the extremities. EXAM: MRI HEAD WITHOUT CONTRAST MRA HEAD WITHOUT CONTRAST MRI CERVICAL SPINE WITHOUT CONTRAST TECHNIQUE: Multiplanar, multiecho pulse sequences of the brain and surrounding structures, and cervical spine, to include the craniocervical junction and cervicothoracic junction, were obtained without intravenous contrast. Angiographic images of the head were obtained using MRA technique without contrast. COMPARISON:  Head CT 11/20/2017 FINDINGS: MRI HEAD FINDINGS Brain: Small scattered acute to early subacute cortical and white matter infarcts are present in the right cerebral hemisphere which appear to largely be along superficial and deep watershed zones. The largest confluent area of infarction involves 2 cm of right frontoparietal cortex at the vertex, corresponding to the abnormality described on CT. Multiple sagittally oriented infarcts are present in the right centrum semiovale.  Small right parieto-occipital cortical infarcts are noted in the posterior MCA/watershed territory. There are a couple of punctate acute right temporal lobe cortical infarcts, and there is a small acute infarct involving the right basal ganglia and anterior limb of internal capsule. No acute left cerebral hemispheric or posterior fossa infarcts are identified. Chronic microhemorrhages are noted in the posterior left frontal lobe at the level of the corona radiata and in the right cerebellum. There are small cortical infarcts in the posterior right temporal lobe and lateral right occipital lobe as well as small white matter infarcts in the right centrum semiovale which have a more late subacute to chronic appearance. Chronic lacunar infarcts are also noted in the basal ganglia bilaterally and in the right pons. The ventricles are normal in size. No mass, midline shift, or extra-axial fluid collection is present. Vascular: Abnormal appearance of the distal right internal carotid artery with some is centric. Skull and upper cervical spine: Unremarkable bone marrow signal. Sinuses/Orbits: Unremarkable orbits. Minimal mucosal thickening in the frontal, ethmoid, and right sphenoid sinuses. Clear mastoid air cells. Other: None. MRA HEAD FINDINGS The visualized distal vertebral arteries are patent to the basilar and codominant. There is mild left V4 stenosis at the vertebrobasilar junction. Patent PICA, AICA, and SCA origins are visualized bilaterally. The basilar artery is patent with motion artifact through its proximal portion and no evidence of significant stenosis. There is a predominantly fetal type origin of the right PCA, possibly with a diminutive and diseased right P1 segment. The large right posterior communicating artery is widely patent, as is the left PCA. The included distal cervical and intracranial segments of both internal carotid arteries are patent to the carotid termini. However, the right ICA is  diffusely small in caliber compared to the left. There is abnormal signal diffusely surrounding the right ICA as it enters the skull base as well as throughout the petrous portion. Flow related enhancement is mildly diminished throughout the distal right petrous ICA extending to the petrous-cavernous junction, and there is superimposed mild additional narrowing throughout this region. The ACAs and MCAs are patent without evidence of proximal branch occlusion or significant proximal stenosis, although the right ACA and right MCA appears slightly attenuated with less robust flow diffusely compared to the contralateral side. No aneurysm is identified. MRI CERVICAL SPINE FINDINGS The study is mildly motion degraded. Alignment: Cervical spine straightening.  No significant listhesis. Vertebrae: No fracture, suspicious osseous lesion, or significant marrow edema. Type 2 degenerative endplate changes from C5-T1. Cord: Normal signal and morphology. Posterior Fossa, vertebral arteries, paraspinal tissues: Preserved vertebral artery flow voids. Abnormal appearance of the proximal right ICA flow void. Unremarkable paraspinal soft tissues. Disc levels: C2-3: Asymmetric right facet arthrosis with likely ankylosis across the joint. Mild uncovertebral spurring. Mild right neural foraminal stenosis. No spinal stenosis. C3-4: Moderate disc space narrowing. Disc bulging, uncovertebral spurring, and mild facet arthrosis result in mild spinal stenosis and moderate to severe bilateral neural foraminal stenosis. C4-5: Mild disc bulging, mild uncovertebral spurring, and mild facet arthrosis result in mild-to-moderate right and moderate left neural foraminal stenosis and borderline spinal stenosis. C5-6: Right greater than left uncovertebral hypertrophy results in severe right and moderate to severe left neural foraminal stenosis. Minimal disc bulging does not result in significant spinal stenosis. C6-7: Uncovertebral spurring results in  moderate to severe bilateral neural foraminal stenosis. No spinal stenosis. C7-T1: Uncovertebral spurring and mild disc bulging result in moderate to severe bilateral neural foraminal stenosis without spinal stenosis. T1-2: Bilateral foraminal disc protrusions result in moderate bilateral neural foraminal stenosis without spinal stenosis. IMPRESSION: 1. 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 2. 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 3. Diffusely abnormal appearance of the distal cervical and proximal intracranial portions of the right ICA including mild diffuse narrowing. Head and neck CTA is recommended to further evaluate for a possible dissection or flow limiting proximal stenosis. 4. Diffuse cervical spondylosis with moderate to severe multilevel neural foraminal stenosis as above. 5. Mild spinal stenosis at C3-4. Electronically Signed   By: Sebastian Ache M.D.   On: 11/20/2017 16:21   Mr Cervical Spine Wo Contrast  Result Date: 11/20/2017 CLINICAL DATA:  Left-sided weakness for several months. Numbness and tingling in the extremities. EXAM: MRI HEAD WITHOUT CONTRAST MRA HEAD WITHOUT CONTRAST MRI CERVICAL SPINE WITHOUT CONTRAST TECHNIQUE: Multiplanar, multiecho pulse sequences of the brain and surrounding structures, and cervical spine, to include the craniocervical junction and cervicothoracic junction, were obtained without intravenous contrast. Angiographic images of the head were obtained using MRA technique without contrast. COMPARISON:  Head CT 11/20/2017 FINDINGS: MRI HEAD FINDINGS Brain: Small scattered acute to early subacute cortical and white matter infarcts are present in the right cerebral hemisphere which appear to largely be along superficial and deep watershed zones. The largest confluent area of infarction involves 2 cm of right frontoparietal cortex at  the vertex, corresponding to the abnormality described on CT. Multiple sagittally oriented infarcts are present in the right centrum semiovale. Small right parieto-occipital cortical infarcts are noted in the posterior MCA/watershed territory. There are a couple of punctate acute right temporal lobe cortical infarcts, and there is a  small acute infarct involving the right basal ganglia and anterior limb of internal capsule. No acute left cerebral hemispheric or posterior fossa infarcts are identified. Chronic microhemorrhages are noted in the posterior left frontal lobe at the level of the corona radiata and in the right cerebellum. There are small cortical infarcts in the posterior right temporal lobe and lateral right occipital lobe as well as small white matter infarcts in the right centrum semiovale which have a more late subacute to chronic appearance. Chronic lacunar infarcts are also noted in the basal ganglia bilaterally and in the right pons. The ventricles are normal in size. No mass, midline shift, or extra-axial fluid collection is present. Vascular: Abnormal appearance of the distal right internal carotid artery with some is centric. Skull and upper cervical spine: Unremarkable bone marrow signal. Sinuses/Orbits: Unremarkable orbits. Minimal mucosal thickening in the frontal, ethmoid, and right sphenoid sinuses. Clear mastoid air cells. Other: None. MRA HEAD FINDINGS The visualized distal vertebral arteries are patent to the basilar and codominant. There is mild left V4 stenosis at the vertebrobasilar junction. Patent PICA, AICA, and SCA origins are visualized bilaterally. The basilar artery is patent with motion artifact through its proximal portion and no evidence of significant stenosis. There is a predominantly fetal type origin of the right PCA, possibly with a diminutive and diseased right P1 segment. The large right posterior communicating artery is widely patent, as is the left PCA. The included  distal cervical and intracranial segments of both internal carotid arteries are patent to the carotid termini. However, the right ICA is diffusely small in caliber compared to the left. There is abnormal signal diffusely surrounding the right ICA as it enters the skull base as well as throughout the petrous portion. Flow related enhancement is mildly diminished throughout the distal right petrous ICA extending to the petrous-cavernous junction, and there is superimposed mild additional narrowing throughout this region. The ACAs and MCAs are patent without evidence of proximal branch occlusion or significant proximal stenosis, although the right ACA and right MCA appears slightly attenuated with less robust flow diffusely compared to the contralateral side. No aneurysm is identified. MRI CERVICAL SPINE FINDINGS The study is mildly motion degraded. Alignment: Cervical spine straightening.  No significant listhesis. Vertebrae: No fracture, suspicious osseous lesion, or significant marrow edema. Type 2 degenerative endplate changes from C5-T1. Cord: Normal signal and morphology. Posterior Fossa, vertebral arteries, paraspinal tissues: Preserved vertebral artery flow voids. Abnormal appearance of the proximal right ICA flow void. Unremarkable paraspinal soft tissues. Disc levels: C2-3: Asymmetric right facet arthrosis with likely ankylosis across the joint. Mild uncovertebral spurring. Mild right neural foraminal stenosis. No spinal stenosis. C3-4: Moderate disc space narrowing. Disc bulging, uncovertebral spurring, and mild facet arthrosis result in mild spinal stenosis and moderate to severe bilateral neural foraminal stenosis. C4-5: Mild disc bulging, mild uncovertebral spurring, and mild facet arthrosis result in mild-to-moderate right and moderate left neural foraminal stenosis and borderline spinal stenosis. C5-6: Right greater than left uncovertebral hypertrophy results in severe right and moderate to severe left  neural foraminal stenosis. Minimal disc bulging does not result in significant spinal stenosis. C6-7: Uncovertebral spurring results in moderate to severe bilateral neural foraminal stenosis. No spinal stenosis. C7-T1: Uncovertebral spurring and mild disc bulging result in moderate to severe bilateral neural foraminal stenosis without spinal stenosis. T1-2: Bilateral foraminal disc protrusions result in moderate bilateral neural foraminal stenosis without spinal stenosis. IMPRESSION: 1. Multiple small infarcts throughout the right cerebral hemisphere primarily in a watershed distribution and  varying in age from acute to late subacute/chronic. 2. 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 3. Diffusely abnormal appearance of the distal cervical and proximal intracranial portions of the right ICA including mild diffuse narrowing. Head and neck CTA is recommended to further evaluate for a possible dissection or flow limiting proximal stenosis. 4. Diffuse cervical spondylosis with moderate to severe multilevel neural foraminal stenosis as above. 5. Mild spinal stenosis at C3-4. Electronically Signed   By: Sebastian Ache M.D.   On: 11/20/2017 16:21     PERTINENT LAB RESULTS: CBC: Recent Labs    11/20/17 1143  WBC 5.4  HGB 15.9  HCT 48.8  PLT 311   CMET CMP     Component Value Date/Time   NA 140 11/20/2017 1143   K 3.9 11/20/2017 1143   CL 105 11/20/2017 1143   CO2 25 11/20/2017 1143   GLUCOSE 96 11/20/2017 1143   BUN <5 (L) 11/20/2017 1143   CREATININE 0.73 11/20/2017 1143   CREATININE 0.83 05/15/2014 0904   CALCIUM 9.3 11/20/2017 1143   PROT 7.3 11/20/2017 1143   ALBUMIN 4.1 11/20/2017 1143   AST 24 11/20/2017 1143   ALT 18 11/20/2017 1143   ALKPHOS 75 11/20/2017 1143   BILITOT 0.6 11/20/2017 1143   GFRNONAA >60 11/20/2017 1143   GFRNONAA >89 05/15/2014 0904   GFRAA >60 11/20/2017 1143   GFRAA >89 05/15/2014 0904    GFR Estimated Creatinine Clearance: 110.5 mL/min  (by C-G formula based on SCr of 0.73 mg/dL). No results for input(s): LIPASE, AMYLASE in the last 72 hours. No results for input(s): CKTOTAL, CKMB, CKMBINDEX, TROPONINI in the last 72 hours. Invalid input(s): POCBNP No results for input(s): DDIMER in the last 72 hours. Recent Labs    11/21/17 0529  HGBA1C 5.6   Recent Labs    11/21/17 0529  CHOL 161  HDL 57  LDLCALC 89  TRIG 76  CHOLHDL 2.8   No results for input(s): TSH, T4TOTAL, T3FREE, THYROIDAB in the last 72 hours.  Invalid input(s): FREET3 No results for input(s): VITAMINB12, FOLATE, FERRITIN, TIBC, IRON, RETICCTPCT in the last 72 hours. Coags: Recent Labs    11/20/17 1143  INR 1.00   Microbiology: No results found for this or any previous visit (from the past 240 hour(s)).  FURTHER DISCHARGE INSTRUCTIONS:  Get Medicines reviewed and adjusted: Please take all your medications with you for your next visit with your Primary MD  Laboratory/radiological data: Please request your Primary MD to go over all hospital tests and procedure/radiological results at the follow up, please ask your Primary MD to get all Hospital records sent to his/her office.  In some cases, they will be blood work, cultures and biopsy results pending at the time of your discharge. Please request that your primary care M.D. goes through all the records of your hospital data and follows up on these results.  Also Note the following: If you experience worsening of your admission symptoms, develop shortness of breath, life threatening emergency, suicidal or homicidal thoughts you must seek medical attention immediately by calling 911 or calling your MD immediately  if symptoms less severe.  You must read complete instructions/literature along with all the possible adverse reactions/side effects for all the Medicines you take and that have been prescribed to you. Take any new Medicines after you have completely understood and accpet all the possible  adverse reactions/side effects.   Do not drive when taking Pain medications or sleeping medications (Benzodaizepines)  Do not  take more than prescribed Pain, Sleep and Anxiety Medications. It is not advisable to combine anxiety,sleep and pain medications without talking with your primary care practitioner  Special Instructions: If you have smoked or chewed Tobacco  in the last 2 yrs please stop smoking, stop any regular Alcohol  and or any Recreational drug use.  Wear Seat belts while driving.  Please note: You were cared for by a hospitalist during your hospital stay. Once you are discharged, your primary care physician will handle any further medical issues. Please note that NO REFILLS for any discharge medications will be authorized once you are discharged, as it is imperative that you return to your primary care physician (or establish a relationship with a primary care physician if you do not have one) for your post hospital discharge needs so that they can reassess your need for medications and monitor your lab values.  Total Time spent coordinating discharge including counseling, education and face to face time equals 45 minutes.  Signed: Elsey Holts 11/22/2017 10:30 AM

## 2017-11-22 NOTE — Progress Notes (Signed)
Occupational Therapy Treatment Patient Details Name: Nathan Hernandez MRN: [id number] DOB: Oct 25, 1960 Today's Date: 11/22/2017    History of present illness Patient is a 57 y/o male admitted on 11/20/17 due to increased N&T at L UE/LE, as well as gait instability and balance deficits. CT of the head revealed chronic appearing lacunar infarcts in the bilateral basal ganglia and right pons.  MRI/MRA brain revealed multiple small infarcts throughout the right cerebral hemisphere primarily in a watershed distribution and varying in age from acute to late subacute/chronic.  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 Patient with a PMH significant for dyslipidemia, Hypertension, tobacco abuse, daily alcohol and marijuana use.  Patient also has a history of osteoarthritis and has chronic left knee pain.   OT comments  Pt progressing towards OT goals, presents sitting EOB pleasant and willing to participate in therapy session. Pt engaged in seated LUE A/AAROM, fine motor and coordination exercises with min cues and education provided on additional fine motor activities to engage in after discharge home. Pt completing functional mobility with SPC and minguard assist, demonstrating tub transfer during session with minguard assist throughout. Feel POC remains appropriate at this time. Will continue to follow acutely to progress pt towards established OT goals.   Follow Up Recommendations  Home health OT;Supervision - Intermittent    Equipment Recommendations  None recommended by OT          Precautions / Restrictions Precautions Precautions: Fall Restrictions Weight Bearing Restrictions: No       Mobility Bed Mobility Overal bed mobility: Modified Independent             General bed mobility comments: sitting EOB upon entry   Transfers Overall transfer level: Needs assistance Equipment used: Straight cane Transfers: Sit to/from Stand Sit to  Stand: Supervision         General transfer comment: for safety    Balance Overall balance assessment: Needs assistance Sitting-balance support: No upper extremity supported;Feet supported Sitting balance-Leahy Scale: Good     Standing balance support: Single extremity supported;During functional activity Standing balance-Leahy Scale: Fair                             ADL either performed or assessed with clinical judgement   ADL Overall ADL's : Needs assistance/impaired                                 Tub/ Shower Transfer: Min guard;Ambulation Tub/Shower Transfer Details (indicate cue type and reason): close minguard for safety; pt completing x3 trials for practice with no LOB; educated to step in with LLE (weaker LE) first Functional mobility during ADLs: Min guard;Cane General ADL Comments: Pt completing LUE fine motor exercises and practicing tub transfer this session; ambulating during session using SPC with minguard assist                        Cognition Arousal/Alertness: Awake/alert Behavior During Therapy: WFL for tasks assessed/performed Overall Cognitive Status: Within Functional Limits for tasks assessed                                          Exercises General Exercises - Upper Extremity Elbow Flexion: AROM;10 reps;Left;Seated Elbow Extension: AROM;10  reps;Left;Seated Hand Exercises Digit Composite Flexion: AROM;Squeeze ball;20 reps;Left;Seated Composite Extension: AROM;Squeeze ball;20 reps;Left;Seated Digit Composite Abduction: AROM;AAROM;10 reps;Seated;Left Digit Composite Adduction: AROM;AAROM;10 reps;Seated;Left Digit Lifts: AROM;AAROM;10 reps;Seated;Left Opposition: AROM;10 reps;Seated;Left                Pertinent Vitals/ Pain       Pain Assessment: No/denies pain                                            Prior Functioning/Environment              Frequency  Min  2X/week        Progress Toward Goals  OT Goals(current goals can now be found in the care plan section)  Progress towards OT goals: Progressing toward goals  Acute Rehab OT Goals Patient Stated Goal: none stated OT Goal Formulation: With patient Time For Goal Achievement: 12/05/17 Potential to Achieve Goals: Good  Plan Discharge plan remains appropriate    Co-evaluation                 AM-PAC PT "6 Clicks" Daily Activity     Outcome Measure   Help from another person eating meals?: None Help from another person taking care of personal grooming?: A Little Help from another person toileting, which includes using toliet, bedpan, or urinal?: A Little Help from another person bathing (including washing, rinsing, drying)?: A Little Help from another person to put on and taking off regular upper body clothing?: A Little Help from another person to put on and taking off regular lower body clothing?: None 6 Click Score: 20    End of Session Equipment Utilized During Treatment: Gait belt;Other (comment)(cane )  OT Visit Diagnosis: Other abnormalities of gait and mobility (R26.89);History of falling (Z91.81);Hemiplegia and hemiparesis Hemiplegia - Right/Left: Left Hemiplegia - dominant/non-dominant: Non-Dominant Hemiplegia - caused by: Cerebral infarction   Activity Tolerance Patient tolerated treatment well   Patient Left with call bell/phone within reach;Other (comment)(sitting EOB )   Nurse Communication Mobility status        Time: [id number] OT Time Calculation (min): 20 min  Charges: OT General Charges $OT Visit: 1 Visit OT Treatments $Therapeutic Activity: 8-22 mins  Nathan Hernandez, [place] Pager [phone] 11/22/2017    Nathan Hernandez 11/22/2017, 12:20 PM

## 2017-11-22 NOTE — Telephone Encounter (Signed)
As instructed by [employer]. Call to patient and instructed to be [facility] at 5:30 am on Friday 11/24/17 for procedure. NPO past MN night prior and expect a call and follow the detailed instructions received from the hospital pre-admission department. Patient verbalized understanding.

## 2017-11-23 ENCOUNTER — Other Ambulatory Visit: Payer: Self-pay | Admitting: *Deleted

## 2017-11-23 ENCOUNTER — Other Ambulatory Visit: Payer: Self-pay

## 2017-11-23 ENCOUNTER — Encounter (HOSPITAL_COMMUNITY): Payer: Self-pay | Admitting: *Deleted

## 2017-11-23 MED FILL — NICOTINE 21 MG/24HR PATCH: 21 | 28 days supply | Qty: 28 | Fill #0

## 2017-11-23 MED FILL — CLOPIDOGREL 75 MG TABLET: 75 | 30 days supply | Qty: 30 | Fill #0

## 2017-11-23 MED FILL — ATORVASTATIN 80 MG TABLET: 80 | 30 days supply | Qty: 30 | Fill #0

## 2017-11-23 NOTE — Progress Notes (Signed)
Spoke with pt for pre-op call. Pt denies cardiac history, chest pain or sob. Pt states he doesn't remember being told he had HTN, it is listed on his medical history. Pt is not diabetic. Pt has taken his Lipitor this evening. Will take Aspirin and Plavix in the AM.

## 2017-11-24 ENCOUNTER — Encounter (HOSPITAL_COMMUNITY): Payer: Self-pay | Admitting: Certified Registered Nurse Anesthetist

## 2017-11-24 ENCOUNTER — Inpatient Hospital Stay (HOSPITAL_COMMUNITY): Payer: Medicaid Other

## 2017-11-24 ENCOUNTER — Inpatient Hospital Stay (HOSPITAL_COMMUNITY): Payer: Medicaid Other | Admitting: Anesthesiology

## 2017-11-24 ENCOUNTER — Other Ambulatory Visit: Payer: Self-pay

## 2017-11-24 ENCOUNTER — Encounter (HOSPITAL_COMMUNITY)
Admission: RE | Disposition: A | Discharge status: Home / Self Care | Payer: Self-pay | Source: Home / Self Care | Attending: Surgery

## 2017-11-24 ENCOUNTER — Inpatient Hospital Stay (HOSPITAL_COMMUNITY)
Admission: RE | Admit: 2017-11-24 | Discharge: 2017-11-30 | DRG: 037 | Disposition: A | Discharge status: Home / Self Care | Payer: Medicaid Other | Attending: Surgery | Admitting: Surgery

## 2017-11-24 DIAGNOSIS — I779 Disorder of arteries and arterioles, unspecified: Secondary | ICD-10-CM | POA: Diagnosis present

## 2017-11-24 DIAGNOSIS — F17290 Nicotine dependence, other tobacco product, uncomplicated: Secondary | ICD-10-CM | POA: Diagnosis present

## 2017-11-24 DIAGNOSIS — M1990 Unspecified osteoarthritis, unspecified site: Secondary | ICD-10-CM | POA: Diagnosis present

## 2017-11-24 DIAGNOSIS — R109 Unspecified abdominal pain: Secondary | ICD-10-CM

## 2017-11-24 DIAGNOSIS — Z23 Encounter for immunization: Secondary | ICD-10-CM

## 2017-11-24 DIAGNOSIS — Z8249 Family history of ischemic heart disease and other diseases of the circulatory system: Secondary | ICD-10-CM

## 2017-11-24 DIAGNOSIS — D62 Acute posthemorrhagic anemia: Secondary | ICD-10-CM | POA: Diagnosis not present

## 2017-11-24 DIAGNOSIS — E785 Hyperlipidemia, unspecified: Secondary | ICD-10-CM | POA: Diagnosis present

## 2017-11-24 DIAGNOSIS — Y848 Other medical procedures as the cause of abnormal reaction of the patient, or of later complication, without mention of misadventure at the time of the procedure: Secondary | ICD-10-CM | POA: Diagnosis present

## 2017-11-24 DIAGNOSIS — R1032 Left lower quadrant pain: Secondary | ICD-10-CM | POA: Diagnosis present

## 2017-11-24 DIAGNOSIS — K5900 Constipation, unspecified: Secondary | ICD-10-CM | POA: Diagnosis not present

## 2017-11-24 DIAGNOSIS — I10 Essential (primary) hypertension: Secondary | ICD-10-CM | POA: Diagnosis present

## 2017-11-24 DIAGNOSIS — Z9114 Patient's other noncompliance with medication regimen: Secondary | ICD-10-CM | POA: Diagnosis not present

## 2017-11-24 DIAGNOSIS — I6529 Occlusion and stenosis of unspecified carotid artery: Secondary | ICD-10-CM | POA: Diagnosis present

## 2017-11-24 DIAGNOSIS — Z8673 Personal history of transient ischemic attack (TIA), and cerebral infarction without residual deficits: Secondary | ICD-10-CM | POA: Diagnosis not present

## 2017-11-24 DIAGNOSIS — I959 Hypotension, unspecified: Secondary | ICD-10-CM | POA: Diagnosis not present

## 2017-11-24 DIAGNOSIS — F1290 Cannabis use, unspecified, uncomplicated: Secondary | ICD-10-CM | POA: Diagnosis present

## 2017-11-24 DIAGNOSIS — K661 Hemoperitoneum: Secondary | ICD-10-CM | POA: Diagnosis not present

## 2017-11-24 DIAGNOSIS — Z823 Family history of stroke: Secondary | ICD-10-CM | POA: Diagnosis not present

## 2017-11-24 DIAGNOSIS — Y92234 Operating room of hospital as the place of occurrence of the external cause: Secondary | ICD-10-CM | POA: Diagnosis present

## 2017-11-24 DIAGNOSIS — I6521 Occlusion and stenosis of right carotid artery: Secondary | ICD-10-CM | POA: Diagnosis present

## 2017-11-24 DIAGNOSIS — I97638 Postprocedural hematoma of a circulatory system organ or structure following other circulatory system procedure: Secondary | ICD-10-CM

## 2017-11-24 DIAGNOSIS — S301XXA Contusion of abdominal wall, initial encounter: Secondary | ICD-10-CM | POA: Diagnosis present

## 2017-11-24 DIAGNOSIS — I739 Peripheral vascular disease, unspecified: Secondary | ICD-10-CM

## 2017-11-24 HISTORY — PX: GROIN DEBRIDEMENT: SHX5159

## 2017-11-24 HISTORY — PX: TRANSCAROTID ARTERY REVASCULARIZATION (TCAR): SHX6784

## 2017-11-24 HISTORY — DX: Cerebral infarction, unspecified: I63.9

## 2017-11-24 HISTORY — PX: TRANSCAROTID ARTERY REVASCULARIZATIONÂ: SHX6778

## 2017-11-24 LAB — URINALYSIS, ROUTINE W REFLEX MICROSCOPIC
BACTERIA UA: NONE SEEN
BILIRUBIN URINE: NEGATIVE
Glucose, UA: NEGATIVE mg/dL
Hgb urine dipstick: NEGATIVE
Ketones, ur: NEGATIVE mg/dL
Nitrite: NEGATIVE
PROTEIN: NEGATIVE mg/dL
Specific Gravity, Urine: 1.017 (ref 1.005–1.030)
pH: 7 (ref 5.0–8.0)

## 2017-11-24 LAB — CBC
HCT: 31.4 % — ABNORMAL LOW (ref 39.0–52.0)
Hemoglobin: 10 g/dL — ABNORMAL LOW (ref 13.0–17.0)
MCH: 28.7 pg (ref 26.0–34.0)
MCHC: 31.8 g/dL (ref 30.0–36.0)
MCV: 90 fL (ref 78.0–100.0)
Platelets: 214 10*3/uL (ref 150–400)
RBC: 3.49 MIL/uL — ABNORMAL LOW (ref 4.22–5.81)
RDW: 15.3 % (ref 11.5–15.5)
WBC: 13.3 10*3/uL — ABNORMAL HIGH (ref 4.0–10.5)

## 2017-11-24 LAB — BASIC METABOLIC PANEL
ANION GAP: 11 (ref 5–15)
Anion gap: 8 (ref 5–15)
BUN: 6 mg/dL (ref 6–20)
BUN: 7 mg/dL (ref 6–20)
CALCIUM: 7.7 mg/dL — AB (ref 8.9–10.3)
CHLORIDE: 113 mmol/L — AB (ref 101–111)
CO2: 16 mmol/L — AB (ref 22–32)
CO2: 18 mmol/L — AB (ref 22–32)
CREATININE: 1.02 mg/dL (ref 0.61–1.24)
Calcium: 7.5 mg/dL — ABNORMAL LOW (ref 8.9–10.3)
Chloride: 115 mmol/L — ABNORMAL HIGH (ref 101–111)
Creatinine, Ser: 1.11 mg/dL (ref 0.61–1.24)
GFR calc Af Amer: >60 mL/min (ref >60)
GFR calc non Af Amer: >60 mL/min (ref >60)
GLUCOSE: 230 mg/dL — AB (ref 65–99)
GLUCOSE: 232 mg/dL — AB (ref 65–99)
POTASSIUM: 4.9 mmol/L (ref 3.5–5.1)
Potassium: 4.8 mmol/L (ref 3.5–5.1)
Sodium: 139 mmol/L (ref 135–145)
Sodium: 142 mmol/L (ref 135–145)

## 2017-11-24 LAB — PREPARE RBC (CROSSMATCH)

## 2017-11-24 LAB — POCT I-STAT 7, (LYTES, BLD GAS, ICA,H+H)
Acid-base deficit: 7 mmol/L — ABNORMAL HIGH (ref 0.0–2.0)
Bicarbonate: 20.8 mmol/L (ref 20.0–28.0)
Calcium, Ion: 1.03 mmol/L — ABNORMAL LOW (ref 1.15–1.40)
HCT: 27 % — ABNORMAL LOW (ref 39.0–52.0)
HEMOGLOBIN: 9.2 g/dL — AB (ref 13.0–17.0)
O2 Saturation: 100 %
PCO2 ART: 48.1 mmHg — AB (ref 32.0–48.0)
POTASSIUM: 5.8 mmol/L — AB (ref 3.5–5.1)
Sodium: 143 mmol/L (ref 135–145)
TCO2: 22 mmol/L (ref 22–32)
pH, Arterial: 7.233 — ABNORMAL LOW (ref 7.350–7.450)
pO2, Arterial: 253 mmHg — ABNORMAL HIGH (ref 83.0–108.0)

## 2017-11-24 LAB — ABO/RH: ABO/RH(D): A NEG

## 2017-11-24 LAB — SURGICAL PCR SCREEN
MRSA, PCR: NEGATIVE
Staphylococcus aureus: NEGATIVE

## 2017-11-24 SURGERY — TRANSCAROTID ARTERY REVASCULARIZATION (TCAR)
Anesthesia: General | Laterality: Right

## 2017-11-24 SURGERY — DEBRIDEMENT, INGUINAL REGION
Anesthesia: General | Laterality: Left

## 2017-11-24 MED ORDER — PROTAMINE SULFATE 10 MG/ML IV SOLN
INTRAVENOUS | Status: DC | PRN
  Administered 2017-11-24: 10 mg via INTRAVENOUS
  Administered 2017-11-24: 20 mg via INTRAVENOUS
  Administered 2017-11-24 (×2): 10 mg via INTRAVENOUS

## 2017-11-24 MED ORDER — ROCURONIUM BROMIDE 10 MG/ML (PF) SYRINGE
PREFILLED_SYRINGE | INTRAVENOUS | Status: DC | PRN
  Administered 2017-11-24: 50 mg via INTRAVENOUS
  Administered 2017-11-24: 10 mg via INTRAVENOUS

## 2017-11-24 MED ORDER — ONDANSETRON HCL 4 MG/2ML IJ SOLN: 4.0000 mg | Freq: Once | INTRAMUSCULAR | Status: DC | PRN

## 2017-11-24 MED ORDER — SODIUM CHLORIDE 0.9 % IV SOLN
INTRAVENOUS | Status: AC
  Filled 2017-11-24: qty 1.2

## 2017-11-24 MED ORDER — ASPIRIN 325 MG PO TABS: 325.0000 mg | ORAL_TABLET | Freq: Every day | ORAL | Status: DC

## 2017-11-24 MED ORDER — FENTANYL CITRATE (PF) 250 MCG/5ML IJ SOLN
INTRAMUSCULAR | Status: DC | PRN
  Administered 2017-11-24: 50 ug via INTRAVENOUS

## 2017-11-24 MED ORDER — ONDANSETRON HCL 4 MG/2ML IJ SOLN
INTRAMUSCULAR | Status: DC | PRN
  Administered 2017-11-24: 4 mg via INTRAVENOUS

## 2017-11-24 MED ORDER — HEPARIN SODIUM (PORCINE) 1000 UNIT/ML IJ SOLN
INTRAMUSCULAR | Status: AC
  Filled 2017-11-24: qty 2

## 2017-11-24 MED ORDER — LIDOCAINE 2% (20 MG/ML) 5 ML SYRINGE
INTRAMUSCULAR | Status: AC
  Filled 2017-11-24: qty 5

## 2017-11-24 MED ORDER — SODIUM BICARBONATE 8.4 % IV SOLN
INTRAVENOUS | Status: DC | PRN
  Administered 2017-11-24 (×4): 50 meq via INTRAVENOUS

## 2017-11-24 MED ORDER — HYDRALAZINE HCL 20 MG/ML IJ SOLN: 5.0000 mg | INTRAMUSCULAR | Status: DC | PRN

## 2017-11-24 MED ORDER — ACETAMINOPHEN 325 MG RE SUPP
325.0000 mg | RECTAL | Status: DC | PRN
  Filled 2017-11-24: qty 2

## 2017-11-24 MED ORDER — MIDAZOLAM HCL 5 MG/5ML IJ SOLN
INTRAMUSCULAR | Status: DC | PRN
  Administered 2017-11-24: 2 mg via INTRAVENOUS

## 2017-11-24 MED ORDER — ETOMIDATE 2 MG/ML IV SOLN
INTRAVENOUS | Status: DC | PRN
  Administered 2017-11-24: 16 mg via INTRAVENOUS

## 2017-11-24 MED ORDER — ROCURONIUM BROMIDE 50 MG/5ML IV SOLN
INTRAVENOUS | Status: AC
  Filled 2017-11-24: qty 1

## 2017-11-24 MED ORDER — ONDANSETRON HCL 4 MG/2ML IJ SOLN
INTRAMUSCULAR | Status: AC
  Filled 2017-11-24: qty 2

## 2017-11-24 MED ORDER — GLYCOPYRROLATE PF 0.2 MG/ML IJ SOSY
PREFILLED_SYRINGE | INTRAMUSCULAR | Status: DC | PRN
  Administered 2017-11-24: .2 mg via INTRAVENOUS

## 2017-11-24 MED ORDER — MIDAZOLAM HCL 2 MG/2ML IJ SOLN
INTRAMUSCULAR | Status: AC
  Filled 2017-11-24: qty 2

## 2017-11-24 MED ORDER — SODIUM CHLORIDE 0.9 % IV SOLN
INTRAVENOUS | Status: DC
  Administered 2017-11-24: 16:00:00 via INTRAVENOUS

## 2017-11-24 MED ORDER — ARTIFICIAL TEARS OPHTHALMIC OINT
TOPICAL_OINTMENT | OPHTHALMIC | Status: AC
  Filled 2017-11-24: qty 3.5

## 2017-11-24 MED ORDER — SODIUM BICARBONATE 8.4 % IV SOLN
INTRAVENOUS | Status: AC
  Filled 2017-11-24: qty 150

## 2017-11-24 MED ORDER — FENTANYL CITRATE (PF) 250 MCG/5ML IJ SOLN
INTRAMUSCULAR | Status: AC
  Filled 2017-11-24: qty 5

## 2017-11-24 MED ORDER — LACTATED RINGERS IV SOLN
INTRAVENOUS | Status: DC
  Administered 2017-11-24 (×2): via INTRAVENOUS
  Administered 2017-11-24: 10 mL/h via INTRAVENOUS

## 2017-11-24 MED ORDER — FENTANYL CITRATE (PF) 100 MCG/2ML IJ SOLN
INTRAMUSCULAR | Status: AC
  Filled 2017-11-24: qty 2

## 2017-11-24 MED ORDER — LABETALOL HCL 5 MG/ML IV SOLN: 5.0000 mg | INTRAVENOUS | Status: DC | PRN

## 2017-11-24 MED ORDER — ROCURONIUM BROMIDE 50 MG/5ML IV SOLN
INTRAVENOUS | Status: AC
  Filled 2017-11-24: qty 2

## 2017-11-24 MED ORDER — PROPOFOL 10 MG/ML IV BOLUS
INTRAVENOUS | Status: DC | PRN
  Administered 2017-11-24: 200 mg via INTRAVENOUS

## 2017-11-24 MED ORDER — METOPROLOL TARTRATE 5 MG/5ML IV SOLN: 2.0000 mg | INTRAVENOUS | Status: DC | PRN

## 2017-11-24 MED ORDER — SODIUM CHLORIDE 0.9 % IV SOLN
INTRAVENOUS | Status: DC | PRN
  Administered 2017-11-24 (×2): via INTRAVENOUS

## 2017-11-24 MED ORDER — LACTATED RINGERS IV SOLN
INTRAVENOUS | Status: DC | PRN
  Administered 2017-11-24 (×2): via INTRAVENOUS

## 2017-11-24 MED ORDER — FENTANYL CITRATE (PF) 100 MCG/2ML IJ SOLN: 25.0000 ug | INTRAMUSCULAR | Status: DC | PRN

## 2017-11-24 MED ORDER — MORPHINE SULFATE (PF) 2 MG/ML IV SOLN
2.0000 mg | INTRAVENOUS | Status: DC | PRN
  Administered 2017-11-24 (×4): 2 mg via INTRAVENOUS
  Filled 2017-11-24: qty 2
  Filled 2017-11-24 (×2): qty 1

## 2017-11-24 MED ORDER — POTASSIUM CHLORIDE CRYS ER 20 MEQ PO TBCR: 20.0000 meq | EXTENDED_RELEASE_TABLET | Freq: Every day | ORAL | Status: DC | PRN

## 2017-11-24 MED ORDER — HEPARIN SODIUM (PORCINE) 1000 UNIT/ML IJ SOLN
INTRAMUSCULAR | Status: DC | PRN
  Administered 2017-11-24: 8000 [IU] via INTRAVENOUS

## 2017-11-24 MED ORDER — HEPARIN SODIUM (PORCINE) 5000 UNIT/ML IJ SOLN
INTRAMUSCULAR | Status: DC | PRN
  Administered 2017-11-24: 11:00:00

## 2017-11-24 MED ORDER — 0.9 % SODIUM CHLORIDE (POUR BTL) OPTIME
TOPICAL | Status: DC | PRN
  Administered 2017-11-24: 1000 mL

## 2017-11-24 MED ORDER — MIDAZOLAM HCL 2 MG/2ML IJ SOLN: 2.0000 mg | Freq: Once | INTRAMUSCULAR | Status: DC

## 2017-11-24 MED ORDER — ALBUMIN HUMAN 5 % IV SOLN
12.5000 g | Freq: Once | INTRAVENOUS | Status: AC
  Administered 2017-11-24: 12.5 g via INTRAVENOUS

## 2017-11-24 MED ORDER — HYDROMORPHONE HCL 1 MG/ML IJ SOLN
0.2500 mg | INTRAMUSCULAR | Status: DC | PRN
Start: 2017-11-24 — End: 2017-11-24

## 2017-11-24 MED ORDER — CALCIUM CHLORIDE 10 % IV SOLN
INTRAVENOUS | Status: DC | PRN
  Administered 2017-11-24: 600 mg via INTRAVENOUS

## 2017-11-24 MED ORDER — SODIUM CHLORIDE 0.9 % IV BOLUS: 500.0000 mL | Freq: Once | INTRAVENOUS | Status: DC | PRN

## 2017-11-24 MED ORDER — CHLORHEXIDINE GLUCONATE 4 % EX LIQD: 60.0000 mL | Freq: Once | CUTANEOUS | Status: DC

## 2017-11-24 MED ORDER — CEFAZOLIN SODIUM-DEXTROSE 2-4 GM/100ML-% IV SOLN
2.0000 g | INTRAVENOUS | Status: AC
  Administered 2017-11-24: 2 g via INTRAVENOUS

## 2017-11-24 MED ORDER — DOCUSATE SODIUM 100 MG PO CAPS
100.0000 mg | ORAL_CAPSULE | Freq: Every day | ORAL | Status: DC
  Administered 2017-11-25 – 2017-11-30 (×6): 100 mg via ORAL
  Filled 2017-11-24 (×7): qty 1

## 2017-11-24 MED ORDER — PHENYLEPHRINE HCL 10 MG/ML IJ SOLN
INTRAVENOUS | Status: DC | PRN
  Administered 2017-11-24: 20 ug/min via INTRAVENOUS

## 2017-11-24 MED ORDER — SUGAMMADEX SODIUM 500 MG/5ML IV SOLN
INTRAVENOUS | Status: AC
  Filled 2017-11-24: qty 5

## 2017-11-24 MED ORDER — SUGAMMADEX SODIUM 500 MG/5ML IV SOLN
INTRAVENOUS | Status: DC | PRN
  Administered 2017-11-24: 300 mg via INTRAVENOUS

## 2017-11-24 MED ORDER — NICOTINE 21 MG/24HR TD PT24
21.0000 mg | MEDICATED_PATCH | Freq: Every day | TRANSDERMAL | Status: DC
  Administered 2017-11-25 – 2017-11-30 (×6): 21 mg via TRANSDERMAL
  Filled 2017-11-24 (×6): qty 1

## 2017-11-24 MED ORDER — MAGNESIUM SULFATE 2 GM/50ML IV SOLN: 2.0000 g | Freq: Every day | INTRAVENOUS | Status: DC | PRN

## 2017-11-24 MED ORDER — ASPIRIN EC 325 MG PO TBEC
325.0000 mg | DELAYED_RELEASE_TABLET | Freq: Every day | ORAL | Status: DC
  Administered 2017-11-25 – 2017-11-30 (×6): 325 mg via ORAL
  Filled 2017-11-24 (×6): qty 1

## 2017-11-24 MED ORDER — IOPAMIDOL (ISOVUE-370) INJECTION 76%
INTRAVENOUS | Status: AC
Start: 2017-11-24 — End: 2017-11-25
  Filled 2017-11-24: qty 100

## 2017-11-24 MED ORDER — ETOMIDATE 2 MG/ML IV SOLN
INTRAVENOUS | Status: AC
  Filled 2017-11-24: qty 10

## 2017-11-24 MED ORDER — SUGAMMADEX SODIUM 200 MG/2ML IV SOLN
INTRAVENOUS | Status: DC | PRN
  Administered 2017-11-24: 200 mg via INTRAVENOUS

## 2017-11-24 MED ORDER — LIDOCAINE HCL (CARDIAC) PF 100 MG/5ML IV SOSY
PREFILLED_SYRINGE | INTRAVENOUS | Status: DC | PRN
  Administered 2017-11-24: 100 mg via INTRATRACHEAL

## 2017-11-24 MED ORDER — CALCIUM CHLORIDE 10 % IV SOLN
INTRAVENOUS | Status: AC
  Filled 2017-11-24: qty 10

## 2017-11-24 MED ORDER — CHLORHEXIDINE GLUCONATE CLOTH 2 % EX PADS: 6.0000 | MEDICATED_PAD | Freq: Once | CUTANEOUS | Status: DC

## 2017-11-24 MED ORDER — CEFAZOLIN SODIUM-DEXTROSE 2-3 GM-%(50ML) IV SOLR
INTRAVENOUS | Status: DC | PRN
  Administered 2017-11-24: 2 g via INTRAVENOUS

## 2017-11-24 MED ORDER — LABETALOL HCL 5 MG/ML IV SOLN
10.0000 mg | INTRAVENOUS | Status: DC | PRN
  Administered 2017-11-24: 5 mg via INTRAVENOUS

## 2017-11-24 MED ORDER — SUGAMMADEX SODIUM 200 MG/2ML IV SOLN
INTRAVENOUS | Status: AC
  Filled 2017-11-24: qty 2

## 2017-11-24 MED ORDER — ALBUMIN HUMAN 5 % IV SOLN
INTRAVENOUS | Status: AC
  Filled 2017-11-24: qty 250

## 2017-11-24 MED ORDER — OXYCODONE HCL 5 MG PO TABS
5.0000 mg | ORAL_TABLET | ORAL | Status: DC | PRN
  Administered 2017-11-24 – 2017-11-25 (×3): 5 mg via ORAL
  Administered 2017-11-25 – 2017-11-28 (×9): 10 mg via ORAL
  Administered 2017-11-29: 5 mg via ORAL
  Administered 2017-11-30: 10 mg via ORAL
  Filled 2017-11-24 (×4): qty 2
  Filled 2017-11-24 (×4): qty 1
  Filled 2017-11-24 (×6): qty 2

## 2017-11-24 MED ORDER — FENTANYL CITRATE (PF) 100 MCG/2ML IJ SOLN
INTRAMUSCULAR | Status: DC | PRN
  Administered 2017-11-24: 100 ug via INTRAVENOUS
  Administered 2017-11-24: 50 ug via INTRAVENOUS

## 2017-11-24 MED ORDER — IODIXANOL 320 MG/ML IV SOLN
INTRAVENOUS | Status: DC | PRN
  Administered 2017-11-24: 20 mL via INTRA_ARTERIAL

## 2017-11-24 MED ORDER — CEFAZOLIN SODIUM-DEXTROSE 2-4 GM/100ML-% IV SOLN: 2.0000 g | INTRAVENOUS | Status: DC

## 2017-11-24 MED ORDER — PHENYLEPHRINE 40 MCG/ML (10ML) SYRINGE FOR IV PUSH (FOR BLOOD PRESSURE SUPPORT)
PREFILLED_SYRINGE | INTRAVENOUS | Status: AC
  Filled 2017-11-24: qty 10

## 2017-11-24 MED ORDER — SODIUM CHLORIDE 0.9% IV SOLUTION: Freq: Once | INTRAVENOUS | Status: DC

## 2017-11-24 MED ORDER — GUAIFENESIN-DM 100-10 MG/5ML PO SYRP
15.0000 mL | ORAL_SOLUTION | ORAL | Status: DC | PRN
  Administered 2017-11-25: 15 mL via ORAL
  Filled 2017-11-24: qty 15

## 2017-11-24 MED ORDER — PHENYLEPHRINE 40 MCG/ML (10ML) SYRINGE FOR IV PUSH (FOR BLOOD PRESSURE SUPPORT)
PREFILLED_SYRINGE | INTRAVENOUS | Status: DC | PRN
  Administered 2017-11-24 (×2): 80 ug via INTRAVENOUS

## 2017-11-24 MED ORDER — ATORVASTATIN CALCIUM 80 MG PO TABS
80.0000 mg | ORAL_TABLET | Freq: Every day | ORAL | Status: DC
  Administered 2017-11-25 – 2017-11-29 (×5): 80 mg via ORAL
  Filled 2017-11-24 (×5): qty 1

## 2017-11-24 MED ORDER — HEMOSTATIC AGENTS (NO CHARGE) OPTIME
TOPICAL | Status: DC | PRN
  Administered 2017-11-24: 1 via TOPICAL

## 2017-11-24 MED ORDER — CLOPIDOGREL BISULFATE 75 MG PO TABS
75.0000 mg | ORAL_TABLET | Freq: Every day | ORAL | Status: DC
  Administered 2017-11-25 – 2017-11-30 (×6): 75 mg via ORAL
  Filled 2017-11-24 (×6): qty 1

## 2017-11-24 MED ORDER — IOPAMIDOL (ISOVUE-370) INJECTION 76%
100.0000 mL | Freq: Once | INTRAVENOUS | Status: AC | PRN
  Administered 2017-11-24: 100 mL via INTRAVENOUS

## 2017-11-24 MED ORDER — ALUM & MAG HYDROXIDE-SIMETH 200-200-20 MG/5ML PO SUSP
15.0000 mL | ORAL | Status: DC | PRN
  Administered 2017-11-25 – 2017-11-29 (×11): 30 mL via ORAL
  Filled 2017-11-24 (×11): qty 30

## 2017-11-24 MED ORDER — SODIUM BICARBONATE 8.4 % IV SOLN
INTRAVENOUS | Status: AC
  Filled 2017-11-24: qty 50

## 2017-11-24 MED ORDER — PHENYLEPHRINE HCL 10 MG/ML IJ SOLN
INTRAMUSCULAR | Status: DC | PRN
  Administered 2017-11-24: 40 ug/min via INTRAVENOUS

## 2017-11-24 MED ORDER — ONDANSETRON HCL 4 MG/2ML IJ SOLN
4.0000 mg | Freq: Four times a day (QID) | INTRAMUSCULAR | Status: DC | PRN
  Administered 2017-11-24 – 2017-11-29 (×2): 4 mg via INTRAVENOUS
  Filled 2017-11-24 (×2): qty 2

## 2017-11-24 MED ORDER — SODIUM CHLORIDE 0.9 % IV SOLN
500.0000 mL | Freq: Once | INTRAVENOUS | Status: AC | PRN
  Administered 2017-11-24: 500 mL via INTRAVENOUS

## 2017-11-24 MED ORDER — ACETAMINOPHEN 325 MG PO TABS
325.0000 mg | ORAL_TABLET | ORAL | Status: DC | PRN
  Administered 2017-11-25 – 2017-11-29 (×6): 650 mg via ORAL
  Filled 2017-11-24 (×7): qty 2

## 2017-11-24 MED ORDER — LIDOCAINE HCL (CARDIAC) PF 100 MG/5ML IV SOSY
PREFILLED_SYRINGE | INTRAVENOUS | Status: DC | PRN
  Administered 2017-11-24: 60 mg via INTRAVENOUS

## 2017-11-24 MED ORDER — PANTOPRAZOLE SODIUM 40 MG PO TBEC: 40.0000 mg | DELAYED_RELEASE_TABLET | Freq: Every day | ORAL | Status: DC

## 2017-11-24 MED ORDER — PHENOL 1.4 % MT LIQD: 1.0000 | OROMUCOSAL | Status: DC | PRN

## 2017-11-24 MED ORDER — SUCCINYLCHOLINE CHLORIDE 20 MG/ML IJ SOLN
INTRAMUSCULAR | Status: DC | PRN
  Administered 2017-11-24: 120 mg via INTRAVENOUS

## 2017-11-24 MED ORDER — SODIUM CHLORIDE 0.9 % IJ SOLN
INTRAMUSCULAR | Status: AC
  Filled 2017-11-24: qty 10

## 2017-11-24 MED ORDER — LABETALOL HCL 5 MG/ML IV SOLN
INTRAVENOUS | Status: AC
  Filled 2017-11-24: qty 4

## 2017-11-24 MED ORDER — SODIUM CHLORIDE 0.9 % IV SOLN: INTRAVENOUS | Status: DC

## 2017-11-24 MED ORDER — CEFAZOLIN SODIUM-DEXTROSE 2-4 GM/100ML-% IV SOLN
2.0000 g | Freq: Three times a day (TID) | INTRAVENOUS | Status: AC
  Administered 2017-11-25: 2 g via INTRAVENOUS
  Filled 2017-11-24 (×4): qty 100

## 2017-11-24 MED ORDER — ROCURONIUM BROMIDE 100 MG/10ML IV SOLN
INTRAVENOUS | Status: DC | PRN
  Administered 2017-11-24: 60 mg via INTRAVENOUS

## 2017-11-24 MED ORDER — PROPOFOL 10 MG/ML IV BOLUS
INTRAVENOUS | Status: AC
  Filled 2017-11-24: qty 40

## 2017-11-24 MED ORDER — LIDOCAINE HCL (PF) 1 % IJ SOLN
INTRAMUSCULAR | Status: AC
  Filled 2017-11-24: qty 30

## 2017-11-24 MED ORDER — PROTAMINE SULFATE 10 MG/ML IV SOLN
INTRAVENOUS | Status: AC
  Filled 2017-11-24: qty 5

## 2017-11-24 MED ORDER — EPHEDRINE SULFATE 50 MG/ML IJ SOLN
INTRAMUSCULAR | Status: AC
  Filled 2017-11-24: qty 1

## 2017-11-24 MED ORDER — CEFAZOLIN SODIUM-DEXTROSE 2-4 GM/100ML-% IV SOLN
INTRAVENOUS | Status: AC
  Filled 2017-11-24: qty 100

## 2017-11-24 MED ORDER — PROPOFOL 10 MG/ML IV BOLUS
INTRAVENOUS | Status: AC
  Filled 2017-11-24: qty 20

## 2017-11-24 MED ORDER — SUCCINYLCHOLINE CHLORIDE 20 MG/ML IJ SOLN
INTRAMUSCULAR | Status: AC
  Filled 2017-11-24: qty 1

## 2017-11-24 MED ORDER — EPHEDRINE SULFATE 50 MG/ML IJ SOLN
INTRAMUSCULAR | Status: DC | PRN
  Administered 2017-11-24: 5 mg via INTRAVENOUS
  Administered 2017-11-24: 10 mg via INTRAVENOUS
  Administered 2017-11-24: 5 mg via INTRAVENOUS
  Administered 2017-11-24: 10 mg via INTRAVENOUS
  Administered 2017-11-24: 5 mg via INTRAVENOUS

## 2017-11-24 MED ORDER — MUPIROCIN 2 % EX OINT
TOPICAL_OINTMENT | CUTANEOUS | Status: AC
  Administered 2017-11-24: 1
  Filled 2017-11-24: qty 22

## 2017-11-24 SURGICAL SUPPLY — 61 items
ADH SKN CLS APL DERMABOND .7 (GAUZE/BANDAGES/DRESSINGS) ×1
BALLN STERLING RX 5X30X80 (BALLOONS) ×3
BALLOON STERLING RX 5X30X80 (BALLOONS) IMPLANT
CANISTER SUCT 3000ML PPV (MISCELLANEOUS) ×3 IMPLANT
CATH ROBINSON RED A/P 18FR (CATHETERS) ×1 IMPLANT
CATH SUCT 10FR WHISTLE TIP (CATHETERS) ×1 IMPLANT
CLIP VESOCCLUDE MED 6/CT (CLIP) ×3 IMPLANT
CLIP VESOCCLUDE SM WIDE 6/CT (CLIP) ×3 IMPLANT
COVER PROBE W GEL 5X96 (DRAPES) ×2 IMPLANT
CRADLE DONUT ADULT HEAD (MISCELLANEOUS) ×3 IMPLANT
DERMABOND ADVANCED (GAUZE/BANDAGES/DRESSINGS) ×2
DERMABOND ADVANCED .7 DNX12 (GAUZE/BANDAGES/DRESSINGS) ×1 IMPLANT
DRAIN CHANNEL 15F RND FF W/TCR (WOUND CARE) IMPLANT
DRSG TEGADERM 4X4.75 (GAUZE/BANDAGES/DRESSINGS) ×2 IMPLANT
ELECT REM PT RETURN 9FT ADLT (ELECTROSURGICAL) ×3
ELECTRODE REM PT RTRN 9FT ADLT (ELECTROSURGICAL) ×1 IMPLANT
EVACUATOR SILICONE 100CC (DRAIN) IMPLANT
GAUZE SPONGE 4X4 12PLY STRL (GAUZE/BANDAGES/DRESSINGS) ×2 IMPLANT
GLOVE BIOGEL PI IND STRL 7.5 (GLOVE) ×1 IMPLANT
GLOVE BIOGEL PI INDICATOR 7.5 (GLOVE) ×2
GLOVE SURG SS PI 7.5 STRL IVOR (GLOVE) ×3 IMPLANT
GOWN STRL REUS W/ TWL LRG LVL3 (GOWN DISPOSABLE) ×2 IMPLANT
GOWN STRL REUS W/ TWL XL LVL3 (GOWN DISPOSABLE) ×1 IMPLANT
GOWN STRL REUS W/TWL LRG LVL3 (GOWN DISPOSABLE) ×6
GOWN STRL REUS W/TWL XL LVL3 (GOWN DISPOSABLE) ×3
GUIDEWIRE ENROUTE 0.014 (WIRE) ×2 IMPLANT
HEMOSTAT SNOW SURGICEL 2X4 (HEMOSTASIS) ×2 IMPLANT
INSERT FOGARTY SM (MISCELLANEOUS) IMPLANT
INTRODUCER KIT GALT 7CM (INTRODUCER) ×3
KIT BASIN OR (CUSTOM PROCEDURE TRAY) ×3 IMPLANT
KIT ENCORE 26 ADVANTAGE (KITS) ×2 IMPLANT
KIT INTRODUCER GALT 7 (INTRODUCER) IMPLANT
KIT SHUNT ARGYLE CAROTID ART 6 (VASCULAR PRODUCTS) IMPLANT
KIT TURNOVER KIT B (KITS) ×3 IMPLANT
NDL HYPO 25GX1X1/2 BEV (NEEDLE) IMPLANT
NDL PERC 18GX7CM (NEEDLE) IMPLANT
NEEDLE HYPO 25GX1X1/2 BEV (NEEDLE) IMPLANT
NEEDLE PERC 18GX7CM (NEEDLE) ×3 IMPLANT
NS IRRIG 1000ML POUR BTL (IV SOLUTION) ×7 IMPLANT
PACK CAROTID (CUSTOM PROCEDURE TRAY) ×3 IMPLANT
PAD ARMBOARD 7.5X6 YLW CONV (MISCELLANEOUS) ×6 IMPLANT
SET MICROPUNCTURE 5F STIFF (MISCELLANEOUS) ×2 IMPLANT
SHUNT CAROTID BYPASS 10 (VASCULAR PRODUCTS) IMPLANT
SHUNT CAROTID BYPASS 12FRX15.5 (VASCULAR PRODUCTS) IMPLANT
STENT TRANSCAROTID SYS 10X40 (Permanent Stent) ×2 IMPLANT
SUT ETHILON 3 0 PS 1 (SUTURE) IMPLANT
SUT PROLENE 5 0 C 1 24 (SUTURE) ×2 IMPLANT
SUT PROLENE 6 0 BV (SUTURE) ×3 IMPLANT
SUT PROLENE 7 0 BV 1 (SUTURE) IMPLANT
SUT SILK 2 0 PERMA HAND 18 BK (SUTURE) ×4 IMPLANT
SUT SILK 3 0 (SUTURE)
SUT SILK 3-0 18XBRD TIE 12 (SUTURE) IMPLANT
SUT VIC AB 3-0 SH 27 (SUTURE) ×3
SUT VIC AB 3-0 SH 27X BRD (SUTURE) ×2 IMPLANT
SUT VICRYL 4-0 PS2 18IN ABS (SUTURE) ×3 IMPLANT
SYR CONTROL 10ML LL (SYRINGE) IMPLANT
SYSTEM TRANSCAROTID NEUROPRTCT (MISCELLANEOUS) IMPLANT
TOWEL GREEN STERILE (TOWEL DISPOSABLE) ×3 IMPLANT
TRANSCAROTID NEUROPROTECT SYS (MISCELLANEOUS) ×3
WATER STERILE IRR 1000ML POUR (IV SOLUTION) ×3 IMPLANT
WIRE BENTSON .035X145CM (WIRE) ×2 IMPLANT

## 2017-11-24 SURGICAL SUPPLY — 53 items
ADH SKN CLS APL DERMABOND .7 (GAUZE/BANDAGES/DRESSINGS) ×1
BANDAGE ACE 4X5 VEL STRL LF (GAUZE/BANDAGES/DRESSINGS) IMPLANT
BANDAGE ACE 6X5 VEL STRL LF (GAUZE/BANDAGES/DRESSINGS) IMPLANT
BLADE SURG 11 STRL SS (BLADE) ×1 IMPLANT
BNDG GAUZE ELAST 4 BULKY (GAUZE/BANDAGES/DRESSINGS) IMPLANT
BOOT SUTURE AID YELLOW STND (SUTURE) ×1 IMPLANT
CANISTER SUCT 3000ML PPV (MISCELLANEOUS) ×2 IMPLANT
CANISTER WOUND CARE 500ML ATS (WOUND CARE) IMPLANT
CLIP LIGATING EXTRA MED SLVR (CLIP) ×2 IMPLANT
CLIP LIGATING EXTRA SM BLUE (MISCELLANEOUS) ×2 IMPLANT
COVER SURGICAL LIGHT HANDLE (MISCELLANEOUS) ×1 IMPLANT
DERMABOND ADVANCED (GAUZE/BANDAGES/DRESSINGS) ×1
DERMABOND ADVANCED .7 DNX12 (GAUZE/BANDAGES/DRESSINGS) IMPLANT
DRSG VAC ATS MED SENSATRAC (GAUZE/BANDAGES/DRESSINGS) IMPLANT
DRSG VAC ATS SM SENSATRAC (GAUZE/BANDAGES/DRESSINGS) IMPLANT
ELECT REM PT RETURN 9FT ADLT (ELECTROSURGICAL) ×2
ELECTRODE REM PT RTRN 9FT ADLT (ELECTROSURGICAL) ×1 IMPLANT
GAUZE SPONGE 2X2 8PLY NS (GAUZE/BANDAGES/DRESSINGS) ×1 IMPLANT
GAUZE SPONGE 4X4 12PLY STRL (GAUZE/BANDAGES/DRESSINGS) ×1 IMPLANT
GLOVE BIO SURGEON STRL SZ 6.5 (GLOVE) ×1 IMPLANT
GLOVE BIO SURGEON STRL SZ7.5 (GLOVE) ×1 IMPLANT
GLOVE BIOGEL PI IND STRL 6.5 (GLOVE) IMPLANT
GLOVE BIOGEL PI IND STRL 7.0 (GLOVE) IMPLANT
GLOVE BIOGEL PI IND STRL 7.5 (GLOVE) IMPLANT
GLOVE BIOGEL PI INDICATOR 6.5 (GLOVE) ×1
GLOVE BIOGEL PI INDICATOR 7.0 (GLOVE) ×1
GLOVE BIOGEL PI INDICATOR 7.5 (GLOVE) ×1
GLOVE SS BIOGEL STRL SZ 7.5 (GLOVE) ×1 IMPLANT
GLOVE SUPERSENSE BIOGEL SZ 7.5 (GLOVE) ×1
GLOVE SURG SS PI 6.5 STRL IVOR (GLOVE) ×1 IMPLANT
GOWN STRL REUS W/ TWL LRG LVL3 (GOWN DISPOSABLE) ×3 IMPLANT
GOWN STRL REUS W/TWL LRG LVL3 (GOWN DISPOSABLE) ×6
KIT BASIN OR (CUSTOM PROCEDURE TRAY) ×2 IMPLANT
NS IRRIG 1000ML POUR BTL (IV SOLUTION) ×2 IMPLANT
PACK GENERAL/GYN (CUSTOM PROCEDURE TRAY) ×2 IMPLANT
PACK UNIVERSAL I (CUSTOM PROCEDURE TRAY) ×2 IMPLANT
PAD ARMBOARD 7.5X6 YLW CONV (MISCELLANEOUS) ×4 IMPLANT
STAPLER VISISTAT 35W (STAPLE) IMPLANT
SUT ETHILON 3 0 PS 1 (SUTURE) IMPLANT
SUT SILK 2 0 (SUTURE) ×2
SUT SILK 2-0 18XBRD TIE 12 (SUTURE) IMPLANT
SUT SILK 3 0 (SUTURE) ×2
SUT SILK 3-0 18XBRD TIE 12 (SUTURE) IMPLANT
SUT SILK 4 0 (SUTURE) ×2
SUT SILK 4-0 18XBRD TIE 12 (SUTURE) IMPLANT
SUT VIC AB 2-0 CT1 27 (SUTURE) ×2
SUT VIC AB 2-0 CT1 TAPERPNT 27 (SUTURE) IMPLANT
SUT VIC AB 2-0 CTX 36 (SUTURE) ×2 IMPLANT
SUT VIC AB 3-0 SH 27 (SUTURE) ×2
SUT VIC AB 3-0 SH 27X BRD (SUTURE) IMPLANT
TAPE CLOTH SURG 4X10 WHT LF (GAUZE/BANDAGES/DRESSINGS) ×1 IMPLANT
TOWEL GREEN STERILE (TOWEL DISPOSABLE) ×2 IMPLANT
WATER STERILE IRR 1000ML POUR (IV SOLUTION) ×2 IMPLANT

## 2017-11-24 NOTE — Progress Notes (Signed)
RR RN and MD in to see pt during shift report at 1900. Pt w/ 10/10 LLQ pain, swollen, hard. L groin site level 0. CT + for retroperitoneal bleed. This RN took pt down to OR and gave bedside report on pt w/ RR RN. Family members given pt belongings and updated on pt/s new room.

## 2017-11-24 NOTE — Interval H&P Note (Signed)
History and Physical Interval Note:  11/24/2017 8:55 AM  Nathan LegacyMichael Domagalski  has presented today for surgery, with the diagnosis of right carotid stenosis  The various methods of treatment have been discussed with the patient and family. After consideration of risks, benefits and other options for treatment, the patient has consented to  Procedure(s): TRANSCAROTID ARTERY REVASCULARIZATION (Right) as a surgical intervention .  The patient's history has been reviewed, patient examined, no change in status, stable for surgery.  I have reviewed the patient's chart and labs.  Questions were answered to the patient's satisfaction.     Discussed proceeding with TCAR due to high lesion.  Pateint on ASA and Plavix.  All questions answered. WB  Wells Brabham

## 2017-11-24 NOTE — Anesthesia Postprocedure Evaluation (Signed)
Anesthesia Post Note  Patient: Nathan LegacyMichael Hernandez  Procedure(s) Performed: EVACUATION OF LEFT GROIN HEMATOMA, REPAIR OF LEFT FEMORAL ARTERY BRACH (Left )     Patient location during evaluation: PACU Anesthesia Type: General Level of consciousness: awake and alert Pain management: pain level controlled Vital Signs Assessment: post-procedure vital signs reviewed and stable Respiratory status: spontaneous breathing, nonlabored ventilation, respiratory function stable and patient connected to nasal cannula oxygen Cardiovascular status: blood pressure returned to baseline and stable Postop Assessment: no apparent nausea or vomiting Anesthetic complications: no    Last Vitals:  Vitals:   11/24/17 2240 11/24/17 2245  BP: 129/82 130/84  Pulse: 60 68  Resp: 17 16  Temp:  36.7 C  SpO2: 99% 100%    Last Pain:  Vitals:   11/24/17 2245  TempSrc:   PainSc: 0-No pain                 Stanislaus Kaltenbach COKER

## 2017-11-24 NOTE — Op Note (Signed)
Patient name: Nathan Hernandez MRN: [id number] DOB: 01-03-1961 Sex: male  11/24/2017 Pre-operative Diagnosis: symptomatic Right Carotid stenosis Post-operative diagnosis:  Same Surgeon:  Ruta Hinds  Assistants:  Annamarie Major Procedure:   #1:Trans carotid artery revascularization (TCAR), Right   #2:  #2 leftt common femoral vein ultrasound guided access Anesthesia:  General Blood Loss:  See anesthesia record Specimens:  300 cc  Findings:  95% right internal carotid artery stenosis stented to residual less than 10% stenosis (10 x 40 mm ENROUTE stent)  Indications:  Patient is a 57 year old male who recently sustained a right brain stroke with left arm weakness.  She has a high carotid lesion anatomically  Procedure:  The patient was identified in the holding area and taken to [place] [facility]  The patient was then placed supine on the table. general anesthesia was administered.  The patient was prepped and draped in the usual sterile fashion.  A time out was called and antibiotics were administered.  Next the patient was inspected with ultrasound on the right neck. Measurements were taken from the right carotid bifurcation down to the level of the clavicle on the common carotid. This was approximate 8 cm. . Next a transverse incision was made between the heads of the sternocleidomastoid muscle on the right side of the neck. Incision was carried down through the platysma to the level of the right internal jugular vein. This was reflected laterally. The vagus nerve was identified and protected. Common carotid artery was dissected free circumferentially at the base of the incision. This was elevated up in the operative field with a vessel loop.Marland Kitchen Approximately 2-3 cm of the artery was dissected free circumferentially to allow adequate exposure. A pursestring suture was placed in the artery at the area we were planning to puncture using a running 6-0 Prolene suture.  At this point femoral venous  access was established via the left groin. Ultrasound was used to identify the left common femoral vein. A micropuncture needle was used to cannulate the leftt common femoral vein and a micropuncture wire advanced into the vein and the micropuncture sheath placed over this. An 32 Bentsen wire was advanced up into the left femoral venous system. The sheath for the flow reversal system was placed into the left femoral system and thoroughly flushed with heparinized saline. The patient was given 8,000 units of total heparin to establish an ACT greater than 250. At this point a micropuncture needle was used to cannulate the right common carotid artery. The micropuncture wire was advanced just below the level of the carotid bifurcation. The micropuncture sheath was then advanced over this about 2 cm into the common carotid artery. This was thoroughly flushed with heparinized saline.a contrast angiogram was then performed in AP projection to confirm that we were truly intraluminal with the sheath. This was also used to determine the level of the carotid bifurcation. The carotid Amplatz wire was then placed through the micropuncture sheath and the sheath removed. The 8 French flow reversal arterial sheath was then advanced over the guidewire and into the common carotid artery. Sheath was sutured to the skin with 3 2-0 silk sutures.Contrast angiogram was again performed to make sure that we were within the true lumen. This was done in 2 views.  The filter device was switched to the low-flow selection. The flow reversal system was then hooked up to the arterial end of the sheath after everything had been thoroughly flushed and de-aired. Passive flow was used  to fill the arterial and of the filter and through the filter and up to the level of the venous sheath. The venous sheath was also fully de-aired and passive flow was established. This was checked by saline infusion in the venous port and noted to flush easily. High  flow was then turned on on the filter device. At this point a TCAR timeout was performed to make sure that the patient's blood pressure was reasonable. We again confirmed that the ACT was above 250. The balloon and guidewire insufflator and stent were all selected. These were all prepped. A 5 x 30 mm angioplasty balloon had been selected based on the preoperative CT. Based on the angiogram intraoperatively as well as preoperative CT and 10 x 40 mm Enroute stent was selected. The 014 wire was slightly shaped opening and due to slight curvature of the internal carotid artery. This was advanced with the balloon over it into the common carotid artery and a guidewire used to selectively catheterize the left internal carotid artery. The lesion was mildly calcified and tortuous turning back on itself..We confirmed that we were within the internal carotid artery by first establishing that the guidewire advanced into the petrous portion of the internal carotid artery and also with contrast angiogram. At this point the 5 x 30 balloon was centered on the lesion and inflated to 8 atm slowly inflating and deflating the balloon.  After predilatation a contrast angiogram was again performed which showed some improvement in the stenosis. We then brought up the 10 x 40 Enroute stent and advanced and centered this on the lesion.  This was then deployed using a pinch technique after opening the Tuohy Borst valve.  We then gave the patient 2 minutes of additional flow reversal before performing a completion angiogram.  Completion angiogram showed residual stenosis <10%. . An AP and lateral angiogram were performed to confirm that the stent was fully deployed. At this point the guidewire was removed. The flow reversal system was clamped off at the arterial sheath and disconnected. The remaining blood was returned to the patient. This was then disconnected from the venous sheath. The arterial sheath was removed and the pursestring suture  cinched down. The patient was given 50 mg of protamine to achieve hemostasis. The venous sheath was pulled and hemostasis obtained with direct pressure. The carotid was inspected found to be without any area of hematoma or active bleeding. The platysma muscle was reapproximated using a running 3-0 Vicryl suture. The skin was closed with a 4-0 Vicryl subcuticular stitch. Dermabond was applied to the incision. Patient was awakened in the operating room and moving her upper and lower extremities symmetrically. He was taken to the recovery room in stable condition.    Disposition:  To PACU stable and neurologically intact   V. Annamarie Major, M.D. [facility] Office: [phone] Pager:  [phone]

## 2017-11-24 NOTE — Anesthesia Procedure Notes (Addendum)
Procedure Name: Intubation Date/Time: 11/24/2017 11:04 AM Performed by: Carney Living, CRNA Pre-anesthesia Checklist: Patient identified, Emergency Drugs available, Suction available, Patient being monitored and Timeout performed Patient Re-evaluated:Patient Re-evaluated prior to induction Oxygen Delivery Method: Circle system utilized Preoxygenation: Pre-oxygenation with 100% oxygen Induction Type: IV induction Ventilation: Mask ventilation without difficulty and Oral airway inserted - appropriate to patient size Laryngoscope Size: Mac and 4 Grade View: Grade II Tube type: Oral Tube size: 7.5 mm Number of attempts: 1 Airway Equipment and Method: Stylet and LTA kit utilized Placement Confirmation: ETT inserted through vocal cords under direct vision,  positive ETCO2 and breath sounds checked- equal and bilateral Secured at: 22 cm Tube secured with: Tape Dental Injury: Teeth and Oropharynx as per pre-operative assessment

## 2017-11-24 NOTE — Progress Notes (Signed)
Patient ID: Nathan LegacyMichael Horacek, male   DOB: 08/10/1960, 57 y.o.   MRN: [id number] CT scan reveals large left retroperitoneal hematoma with compression of bladder and mobilization of left kidney.  Does have a area of active extravasation.  Remains hemodynamically stable.  Discussed with the patient and his family present need for emergent surgical exploration, hematoma evacuation and repair

## 2017-11-24 NOTE — Op Note (Signed)
    OPERATIVE REPORT  DATE OF SURGERY: 11/24/2017  PATIENT: Nathan Hernandez, 57 y.o. male MRN: [id number]  DOB: Mar 10, 1961  PRE-OPERATIVE DIAGNOSIS: Expanding left retroperitoneal hematoma  POST-OPERATIVE DIAGNOSIS:  Same  PROCEDURE: Left groin exploration and evacuation of left retroperitoneal hematoma.  Ligation of tributary branch of external iliac artery  SURGEON:  Gretta Beganodd Jancy Sprankle, M.D.  PHYSICIAN ASSISTANT: Nurse  ANESTHESIA: General  EBL: 200 ml  Total I/O In: 2760 [I.V.:1500; Blood:1260] Out: 425 [Urine:225; Blood:200]  BLOOD ADMINISTERED: 1260 cc  DRAINS: None  SPECIMEN: None  COUNTS CORRECT:  YES  PLAN OF CARE: ICU stable  PATIENT DISPOSITION:  PACU - hemodynamically stable  PROCEDURE DETAILS: The patient is undergone aTCAR procedure earlier today with a left femoral vein access.  On the evening he began having left lower quadrant pain this became more severe.  I was called and patient obviously had fullness in his left groin with hematoma.  He went immediately for a stat CT scan showing a very large retroperitoneal hematoma and some extravasation.  He was taken immediately to the operating room where he underwent a left groin exploration.  The left groin and left abdomen were prepped and draped in usual sterile fashion.  An incision was made over the femoral artery and carried down to isolate the inguinal ligament.  There was old blood up under the inguinal ligament and this was evacuated.  There was fresh arterial bleeding and it was apparent that this was from a small artery that was crossing the femoral vein access site.  This was ligated and divided for control.  Dissection was continued up well into the external iliac to make sure there was no other injury and also the femoral vein was completely exposed and there was no bleeding from this at all.  The retroperitoneal hematoma space was irrigated and was evacuated as best could be with a Yankauer sucker.  The wound was  hemostasis was obtained electrocautery and the wound was closed with 2-0 Vicryl in several layers in the subcutaneous tissue and the skin was closed with 3-0 subcuticular Vicryl stitch.  Patient was hemodynamically stable and transferred to the recovery room prior to going to the intensive care unit   Larina Earthlyodd F. Vertie Dibbern, M.D., [facility] 11/24/2017 10:27 PM

## 2017-11-24 NOTE — Significant Event (Signed)
Rapid Response Event Note  Overview: Called by RN d/t decreased BP-80s and LL abd pain s/p L femoral venous cannulation for OR procedure. Time Called: 1855 Arrival Time: 1900 Event Type: Hypotension, Other (Comment)(LL abd pain s/p venous sheath for OR procedure)  Initial Focused Assessment: Pt laying in bed with eyes closed, diaphoretic, moaning. Pt alert and oriented c/o 10/10 pain in L lower abd extending to back.  L groin cannulation site level "0" however LL abd region very firm and distended.  VS: T-97.5, HR-99 (SR), BP-87/49, RR-19, SpO2-100% on RA. Dr. Arbie CookeyEarly to bedside to assess. Pt to CT and back and prepped for OR.  Interventions: 500cc NS bolus given X 3 CBC/BMET/ T and S ordered and sent 2mg  morphine given per MD order Pt to CT-ABD: large L-sided RP hematoma, displacement of L kidney anteriorly and the urinary bladder to the right.  Plan of Care (if not transferred): To OR. Event Summary: Name of Physician Notified: Dr. Arbie CookeyEarly at (PTA RRT)    at    Outcome: Transferred (Comment)(pt to OR) @ 2035     Terrilyn SaverHopper, Azeez Dunker Anderson

## 2017-11-24 NOTE — Anesthesia Preprocedure Evaluation (Addendum)
Anesthesia Evaluation  Patient identified by MRN, date of birth, ID band Patient awake    Reviewed: Allergy & Precautions, H&P , NPO status , Patient's Chart, lab work & pertinent test results  Airway Mallampati: II  TM Distance: >3 FB Neck ROM: Full    Dental no notable dental hx. (+) Teeth Intact, Dental Advisory Given   Pulmonary Current Smoker,    Pulmonary exam normal breath sounds clear to auscultation       Cardiovascular hypertension, Pt. on medications  Rhythm:Regular Rate:Normal     Neuro/Psych CVA, Residual Symptoms negative psych ROS   GI/Hepatic negative GI ROS, Neg liver ROS,   Endo/Other  negative endocrine ROS  Renal/GU negative Renal ROS  negative genitourinary   Musculoskeletal  (+) Arthritis , Osteoarthritis,    Abdominal   Peds  Hematology negative hematology ROS (+)   Anesthesia Other Findings Weakness on left side from CVA  Reproductive/Obstetrics negative OB ROS                           Anesthesia Physical Anesthesia Plan  ASA: III  Anesthesia Plan: General   Post-op Pain Management:    Induction: Intravenous  PONV Risk Score and Plan: 2 and Ondansetron, Dexamethasone and Midazolam  Airway Management Planned: Oral ETT  Additional Equipment: Arterial line  Intra-op Plan:   Post-operative Plan: Extubation in OR  Informed Consent: I have reviewed the patients History and Physical, chart, labs and discussed the procedure including the risks, benefits and alternatives for the proposed anesthesia with the patient or authorized representative who has indicated his/her understanding and acceptance.   Dental advisory given  Plan Discussed with: CRNA, Anesthesiologist and Surgeon  Anesthesia Plan Comments:        Anesthesia Quick Evaluation

## 2017-11-24 NOTE — Anesthesia Preprocedure Evaluation (Addendum)
Anesthesia Evaluation  Patient identified by MRN, date of birth, ID band Patient awake    Reviewed: NPO status , Patient's Chart, lab work & pertinent test results, Unable to perform ROS - Chart review onlyPreop documentation limited or incomplete due to emergent nature of procedure.  Airway Mallampati: II  TM Distance: >3 FB Neck ROM: Full    Dental  (+) Poor Dentition, Dental Advisory Given   Pulmonary Current Smoker,    breath sounds clear to auscultation       Cardiovascular hypertension,  Rhythm:Regular Rate:Tachycardia     Neuro/Psych    GI/Hepatic   Endo/Other    Renal/GU      Musculoskeletal   Abdominal   Peds  Hematology   Anesthesia Other Findings   Reproductive/Obstetrics                            Anesthesia Physical Anesthesia Plan  ASA: IV and emergent  Anesthesia Plan: General   Post-op Pain Management:    Induction: Intravenous, Rapid sequence and Cricoid pressure planned  PONV Risk Score and Plan:   Airway Management Planned: Oral ETT  Additional Equipment: Arterial line  Intra-op Plan:   Post-operative Plan: Extubation in OR  Informed Consent: I have reviewed the patients History and Physical, chart, labs and discussed the procedure including the risks, benefits and alternatives for the proposed anesthesia with the patient or authorized representative who has indicated his/her understanding and acceptance.   Dental advisory given  Plan Discussed with: CRNA and Anesthesiologist  Anesthesia Plan Comments:        Anesthesia Quick Evaluation

## 2017-11-24 NOTE — Progress Notes (Signed)
Patient ID: Nathan Hernandez, male   DOB: 19-Aug-1960, 57 y.o.   MRN: [id number] Called to see patient with left lower quadrant abdominal pain rated as 10 out of 10.  Also had some hypotension with blood pressure around 80.  Responded to fluid bolus.  Currently blood pressure is 120 by arterial line.  Heart rate 110.  Firm in his left lower quadrant and left abdomen.  Answers questions appropriately.  No evidence of hematoma in his left groin.  Will obtain stat CT scan of his abdomen and pelvis.  Concern regarding retroperitoneal hematoma.  He did have a venous puncture only with theTCAR procedure  Discussed with multiple family members present as well as the patient

## 2017-11-24 NOTE — Anesthesia Procedure Notes (Signed)
Procedure Name: Intubation Date/Time: 11/24/2017 9:09 PM Performed by: Claudina LickMahony, Loys Shugars D, CRNA Pre-anesthesia Checklist: Patient identified, Emergency Drugs available, Suction available, Patient being monitored and Timeout performed Patient Re-evaluated:Patient Re-evaluated prior to induction Oxygen Delivery Method: Circle system utilized Preoxygenation: Pre-oxygenation with 100% oxygen Induction Type: IV induction, Rapid sequence and Cricoid Pressure applied Laryngoscope Size: Miller, 2 and Glidescope (Dl x 1 by CRna with grade 3/4 view- no attempt at intubation. DL x 1 by MDA with esophageal intubation. DL x 1 with glidescope- grade 1 view) Grade View: Grade I Tube type: Subglottic suction tube Tube size: 7.5 mm Number of attempts: 3 (see note) Airway Equipment and Method: Stylet and Video-laryngoscopy Placement Confirmation: ETT inserted through vocal cords under direct vision,  positive ETCO2 and breath sounds checked- equal and bilateral Secured at: 23 cm Tube secured with: Tape Dental Injury: Teeth and Oropharynx as per pre-operative assessment

## 2017-11-24 NOTE — Transfer of Care (Signed)
Immediate Anesthesia Transfer of Care Note  Patient: Nathan Hernandez  Procedure(s) Performed: EVACUATION OF LEFT GROIN HEMATOMA, REPAIR OF LEFT FEMORAL ARTERY BRACH (Left )  Patient Location: PACU  Anesthesia Type:General  Level of Consciousness: drowsy  Airway & Oxygen Therapy: Patient Spontanous Breathing  Post-op Assessment: Report given to RN and Post -op Vital signs reviewed and stable  Post vital signs: Reviewed and stable  Last Vitals:  Vitals Value Taken Time  BP    Temp    Pulse 110 11/24/2017 10:11 PM  Resp 30 11/24/2017 10:11 PM  SpO2 100 % 11/24/2017 10:11 PM  Vitals shown include unvalidated device data.  Last Pain:  Vitals:   11/24/17 1855  TempSrc: Axillary  PainSc:       Patients Stated Pain Goal: 3 (11/24/17 16100812)  Complications: No apparent anesthesia complications

## 2017-11-24 NOTE — Anesthesia Postprocedure Evaluation (Signed)
Anesthesia Post Note  Patient: Nathan Hernandez  Procedure(s) Performed: TRANSCAROTID ARTERY REVASCULARIZATION (Right )     Patient location during evaluation: PACU Anesthesia Type: General Level of consciousness: awake and alert Pain management: pain level controlled Vital Signs Assessment: post-procedure vital signs reviewed and stable Respiratory status: spontaneous breathing, nonlabored ventilation, respiratory function stable and patient connected to nasal cannula oxygen Cardiovascular status: blood pressure returned to baseline and stable Postop Assessment: no apparent nausea or vomiting Anesthetic complications: no    Last Vitals:  Vitals:   11/24/17 1330 11/24/17 1345  BP: (!) 86/63 104/72  Pulse: 97 74  Resp: (!) 23 18  Temp:    SpO2: 99% 100%    Last Pain:  Vitals:   11/24/17 1320  PainSc: 0-No pain                 Sharicka Pogorzelski,W. EDMOND

## 2017-11-24 NOTE — Anesthesia Procedure Notes (Signed)
Arterial Line Insertion Start/End6/21/2019 9:45 AM Performed by: White, Cordella RegisterKelsey Tena Helmuth Recupero, CRNA, CRNA  Lidocaine 1% used for infiltration Left, radial was placed Catheter size: 20 G Hand hygiene performed  and maximum sterile barriers used  Allen's test indicative of satisfactory collateral circulation Attempts: 1 Procedure performed without using ultrasound guided technique. Following insertion, dressing applied and Biopatch. Post procedure assessment: normal  Patient tolerated the procedure well with no immediate complications.

## 2017-11-24 NOTE — Transfer of Care (Signed)
Immediate Anesthesia Transfer of Care Note  Patient: Nathan Hernandez  Procedure(s) Performed: Zada FindersRANSCAROTID ARTERY REVASCULARIZATION (Right )  Patient Location: PACU  Anesthesia Type:General  Level of Consciousness: awake, alert , oriented and patient cooperative  Airway & Oxygen Therapy: Patient Spontanous Breathing and Patient connected to nasal cannula oxygen  Post-op Assessment: Report given to RN, Post -op Vital signs reviewed and stable and Patient moving all extremities X 4  Post vital signs: Reviewed and stable  Last Vitals:  Vitals Value Taken Time  BP    Temp 36.4 C 11/24/2017  1:15 PM  Pulse    Resp 19 11/24/2017  1:18 PM  SpO2    Vitals shown include unvalidated device data.  Last Pain:  Vitals:   11/24/17 0812  PainSc: 0-No pain      Patients Stated Pain Goal: 3 (11/24/17 16100812)  Complications: No apparent anesthesia complications

## 2017-11-25 ENCOUNTER — Encounter (HOSPITAL_COMMUNITY): Payer: Self-pay | Admitting: Vascular Surgery

## 2017-11-25 DIAGNOSIS — I779 Disorder of arteries and arterioles, unspecified: Secondary | ICD-10-CM | POA: Diagnosis present

## 2017-11-25 DIAGNOSIS — I739 Peripheral vascular disease, unspecified: Secondary | ICD-10-CM

## 2017-11-25 LAB — BPAM RBC
BLOOD PRODUCT EXPIRATION DATE: 201907042359
BLOOD PRODUCT EXPIRATION DATE: 201907052359
Blood Product Expiration Date: 201906302359
Blood Product Expiration Date: 201907032359
Blood Product Expiration Date: 201907052359
Blood Product Expiration Date: 201907062359
ISSUE DATE / TIME: 201906212023
ISSUE DATE / TIME: 201906212023
ISSUE DATE / TIME: 201906212023
ISSUE DATE / TIME: 201906212023
UNIT TYPE AND RH: 600
UNIT TYPE AND RH: 600
UNIT TYPE AND RH: 600
UNIT TYPE AND RH: 600
Unit Type and Rh: 600
Unit Type and Rh: 600

## 2017-11-25 LAB — BASIC METABOLIC PANEL
Anion gap: 6 (ref 5–15)
BUN: 6 mg/dL (ref 6–20)
CO2: 23 mmol/L (ref 22–32)
CREATININE: 0.79 mg/dL (ref 0.61–1.24)
Calcium: 7.9 mg/dL — ABNORMAL LOW (ref 8.9–10.3)
Chloride: 110 mmol/L (ref 101–111)
GFR calc Af Amer: >60 mL/min (ref >60)
GFR calc non Af Amer: >60 mL/min (ref >60)
Glucose, Bld: 153 mg/dL — ABNORMAL HIGH (ref 65–99)
POTASSIUM: 4 mmol/L (ref 3.5–5.1)
SODIUM: 139 mmol/L (ref 135–145)

## 2017-11-25 LAB — TYPE AND SCREEN
ABO/RH(D): A NEG
Antibody Screen: NEGATIVE
UNIT DIVISION: 0
UNIT DIVISION: 0
UNIT DIVISION: 0
Unit division: 0
Unit division: 0
Unit division: 0

## 2017-11-25 LAB — CBC
HCT: 38.3 % — ABNORMAL LOW (ref 39.0–52.0)
HEMATOCRIT: 37.3 % — AB (ref 39.0–52.0)
HEMOGLOBIN: 13 g/dL (ref 13.0–17.0)
Hemoglobin: 12.1 g/dL — ABNORMAL LOW (ref 13.0–17.0)
MCH: 28.7 pg (ref 26.0–34.0)
MCH: 28.8 pg (ref 26.0–34.0)
MCHC: 32.4 g/dL (ref 30.0–36.0)
MCHC: 33.9 g/dL (ref 30.0–36.0)
MCV: 88.4 fL (ref 78.0–100.0)
MCV: 84.9 fL (ref 78.0–100.0)
PLATELETS: 134 10*3/uL — AB (ref 150–400)
Platelets: 139 10*3/uL — ABNORMAL LOW (ref 150–400)
RBC: 4.22 MIL/uL (ref 4.22–5.81)
RBC: 4.51 MIL/uL (ref 4.22–5.81)
RDW: 14.6 % (ref 11.5–15.5)
RDW: 15 % (ref 11.5–15.5)
WBC: 19.4 10*3/uL — ABNORMAL HIGH (ref 4.0–10.5)
WBC: 20.6 10*3/uL — ABNORMAL HIGH (ref 4.0–10.5)

## 2017-11-25 LAB — MAGNESIUM: MAGNESIUM: 1.5 mg/dL — AB (ref 1.7–2.4)

## 2017-11-25 MED ORDER — PNEUMOCOCCAL VAC POLYVALENT 25 MCG/0.5ML IJ INJ
0.5000 mL | INJECTION | INTRAMUSCULAR | Status: AC
  Administered 2017-11-26: 0.5 mL via INTRAMUSCULAR
  Filled 2017-11-25: qty 0.5

## 2017-11-25 MED ORDER — BISACODYL 10 MG RE SUPP
10.0000 mg | Freq: Every day | RECTAL | Status: DC | PRN
  Administered 2017-11-25 – 2017-11-28 (×3): 10 mg via RECTAL
  Filled 2017-11-25 (×3): qty 1

## 2017-11-25 MED ORDER — PANTOPRAZOLE SODIUM 40 MG PO TBEC
40.0000 mg | DELAYED_RELEASE_TABLET | Freq: Every day | ORAL | Status: DC
  Administered 2017-11-25 – 2017-11-30 (×6): 40 mg via ORAL
  Filled 2017-11-25 (×6): qty 1

## 2017-11-25 MED ORDER — DEXTROSE-NACL 5-0.45 % IV SOLN: INTRAVENOUS | Status: DC

## 2017-11-25 MED ORDER — BISACODYL 10 MG RE SUPP: 10.0000 mg | Freq: Once | RECTAL | Status: DC

## 2017-11-25 MED ORDER — SODIUM CHLORIDE 0.9 % IV SOLN: 500.0000 mL | Freq: Once | INTRAVENOUS | Status: DC | PRN

## 2017-11-25 MED ORDER — DM-GUAIFENESIN ER 30-600 MG PO TB12
1.0000 | ORAL_TABLET | Freq: Two times a day (BID) | ORAL | Status: DC | PRN
  Filled 2017-11-25: qty 1

## 2017-11-25 MED ORDER — MORPHINE SULFATE (PF) 2 MG/ML IV SOLN
2.0000 mg | INTRAVENOUS | Status: DC | PRN
  Administered 2017-11-26: 2 mg via INTRAVENOUS
  Filled 2017-11-25: qty 1

## 2017-11-25 MED ORDER — POTASSIUM CHLORIDE CRYS ER 20 MEQ PO TBCR: 20.0000 meq | EXTENDED_RELEASE_TABLET | Freq: Once | ORAL | Status: DC | PRN

## 2017-11-25 MED ORDER — OXYCODONE HCL 5 MG PO TABS: 5.0000 mg | ORAL_TABLET | Freq: Four times a day (QID) | ORAL | 0 refills | Status: DC | PRN

## 2017-11-25 MED ORDER — LORATADINE 10 MG PO TABS
10.0000 mg | ORAL_TABLET | Freq: Every day | ORAL | Status: DC
  Administered 2017-11-25 – 2017-11-30 (×6): 10 mg via ORAL
  Filled 2017-11-25 (×7): qty 1

## 2017-11-25 NOTE — Progress Notes (Signed)
Patient transferred to room 4E27.  Report received by Domingo CockingEster, RN.

## 2017-11-25 NOTE — Progress Notes (Signed)
Report received from 2 heart RN in regards to pt current medical status, pt arrived to the unit around 1810. Pt oriented to the unit and room, VSS, telemetry applied and verified; pt  currently denies pain, pt skin intact with no pressure ulcer or wounds noted except for surgical incisions with drg intact.. Pt resting comfortably in bed with call light within reach and son at bedside.

## 2017-11-25 NOTE — Plan of Care (Signed)
  Problem: Education: Goal: Knowledge of General Education information will improve Outcome: Completed/Met   Problem: Health Behavior/Discharge Planning: Goal: Ability to manage health-related needs will improve Outcome: Completed/Met   Problem: Clinical Measurements: Goal: Ability to maintain clinical measurements within normal limits will improve Outcome: Progressing Goal: Will remain free from infection Outcome: Progressing Goal: Diagnostic test results will improve Outcome: Progressing Goal: Respiratory complications will improve Outcome: Progressing Goal: Cardiovascular complication will be avoided Outcome: Progressing   Problem: Activity: Goal: Risk for activity intolerance will decrease Outcome: Progressing   Problem: Nutrition: Goal: Adequate nutrition will be maintained Outcome: Adequate for Discharge   Problem: Coping: Goal: Level of anxiety will decrease Outcome: Completed/Met   Problem: Elimination: Goal: Will not experience complications related to bowel motility Outcome: Adequate for Discharge Goal: Will not experience complications related to urinary retention Outcome: Adequate for Discharge   Problem: Pain Managment: Goal: General experience of comfort will improve Outcome: Completed/Met   Problem: Safety: Goal: Ability to remain free from injury will improve Outcome: Completed/Met   Problem: Skin Integrity: Goal: Risk for impaired skin integrity will decrease Outcome: Adequate for Discharge   Problem: Clinical Measurements: Goal: Ability to maintain clinical measurements within normal limits will improve Outcome: Progressing   Problem: Skin Integrity: Goal: Demonstration of wound healing without infection will improve Outcome: Completed/Met   Problem: Education: Goal: Knowledge of disease or condition will improve Outcome: Completed/Met Goal: Knowledge of secondary prevention will improve Outcome: Completed/Met Goal: Knowledge of patient  specific risk factors addressed and post discharge goals established will improve Outcome: Completed/Met   Problem: Education: Goal: Knowledge of secondary prevention will improve Outcome: Completed/Met   Problem: Education: Goal: Knowledge of patient specific risk factors addressed and post discharge goals established will improve Outcome: Completed/Met   Problem: Coping: Goal: Will verbalize positive feelings about self Outcome: Completed/Met Goal: Will identify appropriate support needs Outcome: Completed/Met   Problem: Coping: Goal: Will identify appropriate support needs Outcome: Completed/Met   Problem: Coping: Goal: Will verbalize positive feelings about self Outcome: Completed/Met Goal: Will identify appropriate support needs Outcome: Completed/Met   Problem: Coping: Goal: Will identify appropriate support needs Outcome: Completed/Met   Problem: Health Behavior/Discharge Planning: Goal: Ability to manage health-related needs will improve Outcome: Progressing   Problem: Nutrition: Goal: Dietary intake will improve Outcome: Progressing   Problem: Nutrition: Goal: Dietary intake will improve Outcome: Progressing   Problem: Ischemic Stroke/TIA Tissue Perfusion: Goal: Complications of ischemic stroke/TIA will be minimized Outcome: Progressing

## 2017-11-25 NOTE — Progress Notes (Signed)
Subjective: Interval History: none.. Much more comfortable this morning.  Some flank soreness.  Objective: Vital signs in last 24 hours: Temp:  [97.5 F (36.4 C)-98.6 F (37 C)] 98.6 F (37 C) (06/22 0741) Pulse Rate:  [57-115] 68 (06/22 0000) Resp:  [10-29] 17 (06/22 0700) BP: (82-160)/(55-100) 126/80 (06/22 0700) SpO2:  [96 %-100 %] 99 % (06/22 0700) Arterial Line BP: (84-302)/(36-84) 149/66 (06/22 0600) Weight:  [178 lb 5.6 oz (80.9 kg)] 178 lb 5.6 oz (80.9 kg) (06/22 0000)  Intake/Output from previous day: 06/21 0701 - 06/22 0700 In: 6349.2 [P.O.:960; I.V.:3929.2; Blood:1260; IV Piggyback:200] Out: 1525 [Urine:1225; Blood:300] Intake/Output this shift: No intake/output data recorded.  Right neck incision without hematoma.  Neurologically stable.  Left groin without hematoma.  2+ dorsalis pedis pulses bilaterally.  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 EXAM: CT ANGIOGRAPHY HEAD AND NECK TECHNIQUE: Multidetector CT imaging of the head and neck was performed using the standard protocol during bolus administration of intravenous contrast. Multiplanar CT image reconstructions and MIPs were obtained to evaluate the vascular anatomy. Carotid stenosis measurements (when applicable) are obtained utilizing NASCET criteria, using the distal internal carotid diameter as the denominator. CONTRAST:  50mL ISOVUE-370 IOPAMIDOL (ISOVUE-370) INJECTION 76% COMPARISON:  Prior head CT and MRI from earlier the same day. FINDINGS:  CTA NECK FINDINGS Aortic arch: Visualized aortic arch of normal caliber with normal branch pattern. Mild aortic atherosclerosis without hemodynamically significant stenosis about the origin of the great vessels. Visualized subclavian arteries widely patent. Right carotid system: Right common carotid artery patent from its origin to the bifurcation without hemodynamically significant stenosis. Mixed atheromatous plaque at the proximal right ICA with severe near occlusive stenosis (series 7, image 191). A string sign is faintly visible on axial sequences. Stenosis begins approximately 4 mm distal to the bifurcation and measures approximately 1 cm in length. Right ICA widely patent distally to the skull base without additional stenosis or occlusion. Left carotid system: Scattered atheromatous plaque within the left common carotid artery without hemodynamically significant stenosis. A centric calcified plaque about the left bifurcation without significant stenosis. Left ICA patent distally to the skull base without stenosis, dissection, or occlusion. Vertebral arteries: Both of the vertebral arteries arise from the subclavian arteries. Focal plaque at the origin of the left vertebral artery with approximate 50% stenosis. Vertebral arteries otherwise widely patent within the neck. Skeleton: No acute osseous abnormality. No worrisome lytic or blastic osseous lesions. Multilevel degenerative disc disease and facet arthritis throughout the cervical spine, better evaluated on prior MRI. Other neck: Unremarkable. Upper chest: Unremarkable. Review of the MIP images confirms the above findings CTA HEAD FINDINGS Anterior circulation: Eccentric calcified plaque within the horizontal petrous right ICA with mild stenosis. Fairly heavy atheromatous burden throughout the cavernous/supraclinoid right ICA with moderate to advanced atheromatous stenosis of up to approximately 75%. Right ICA terminus widely patent. On the left, petrous  left ICA widely patent without  stenosis. Atheromatous plaque throughout the left carotid siphon with moderate multifocal narrowing of up to approximately 50%. Left ICA terminus widely patent. A1 segments mildly irregular but patent bilaterally. Right A1 hypoplastic. Normal anterior communicating artery. Anterior cerebral arteries patent to their distal aspects without stenosis. No M1 stenosis or occlusion. No proximal M2 occlusion distal MCA branches patent bilaterally, although overall the right MCA and ACA are attenuated as compared to the left due to the proximal right ICA stenosis. Posterior circulation: Atheromatous irregularity within the distal right V4 segment without high-grade stenosis. Left V4 widely patent to the vertebrobasilar junction. Posterior inferior cerebral arteries patent bilaterally. Multifocal atheromatous irregularity throughout the basilar artery. Moderate stenoses of up to 50% seen involving the proximal and mid basilar artery. Superior cerebral arteries patent bilaterally. Left PCA supplied via the basilar. Fetal type origin of the right PCA. PCAs patent to their distal aspects without high-grade stenosis. Venous sinuses: Grossly patent, although not well assessed due to arterial timing of the contrast bolus. Anatomic variants: Fetal type origin of the right PCA. No intracranial aneurysm. Delayed phase: Not performed. Review of the MIP images confirms the above findings IMPRESSION: 1. 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 Secondary downstream attenuation with decreased flow throughout the right ACA and MCA territories as compared to the left. 2. Prominent carotid siphon atherosclerotic change with associated stenoses of up to 75% on the right and 50% on the left. 3. 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 4. 50%  stenosis at the origin of the left vertebral artery. 5. Fetal type right PCA. Electronically Signed   By: Rise Mu M.D.   On: 11/20/2017 20:05   Ct Head Wo Contrast  Result Date: 11/20/2017 CLINICAL DATA:  Left-sided weakness the past few months. Numbness and tingling of the extremities. EXAM: CT HEAD WITHOUT CONTRAST TECHNIQUE: Contiguous axial images were obtained from the base of the skull through the vertex without intravenous contrast. COMPARISON:  CT head report dated January 26, 1999. FINDINGS: Brain: Focal loss of the normal gray-white matter differentiation in the right pre and postcentral gyri. No evidence of acute hemorrhage, hydrocephalus, extra-axial collection or mass lesion/mass effect. Old lacunar infarcts in the bilateral basal ganglia and right pons. Vascular: Atherosclerotic vascular calcification of the carotid siphons. No hyperdense vessel. Skull: Normal. Negative for fracture or focal lesion. Sinuses/Orbits: No acute finding. Other: None. IMPRESSION: 1. Loss of the normal gray-white matter differentiation in the right pre and postcentral gyri, suspicious for subacute infarct given clinical history. 2. Chronic appearing lacunar infarcts in the bilateral basal ganglia and right pons. Electronically Signed   By: Obie Dredge M.D.   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 EXAM: CT ANGIOGRAPHY HEAD AND NECK TECHNIQUE: Multidetector CT imaging of the head and neck was performed using the standard protocol during bolus administration of intravenous contrast. Multiplanar CT image reconstructions and MIPs were obtained to evaluate the vascular anatomy. Carotid stenosis measurements (when applicable) are obtained utilizing NASCET criteria, using the distal internal carotid diameter as the denominator. CONTRAST:   50mL ISOVUE-370 IOPAMIDOL (ISOVUE-370) INJECTION 76% COMPARISON:  Prior head CT and MRI from earlier the same day. FINDINGS: CTA NECK FINDINGS Aortic arch: Visualized  aortic arch of normal caliber with normal branch pattern. Mild aortic atherosclerosis without hemodynamically significant stenosis about the origin of the great vessels. Visualized subclavian arteries widely patent. Right carotid system: Right common carotid artery patent from its origin to the bifurcation without hemodynamically significant stenosis. Mixed atheromatous plaque at the proximal right ICA with severe near occlusive stenosis (series 7, image 191). A string sign is faintly visible on axial sequences. Stenosis begins approximately 4 mm distal to the bifurcation and measures approximately 1 cm in length. Right ICA widely patent distally to the skull base without additional stenosis or occlusion. Left carotid system: Scattered atheromatous plaque within the left common carotid artery without hemodynamically significant stenosis. A centric calcified plaque about the left bifurcation without significant stenosis. Left ICA patent distally to the skull base without stenosis, dissection, or occlusion. Vertebral arteries: Both of the vertebral arteries arise from the subclavian arteries. Focal plaque at the origin of the left vertebral artery with approximate 50% stenosis. Vertebral arteries otherwise widely patent within the neck. Skeleton: No acute osseous abnormality. No worrisome lytic or blastic osseous lesions. Multilevel degenerative disc disease and facet arthritis throughout the cervical spine, better evaluated on prior MRI. Other neck: Unremarkable. Upper chest: Unremarkable. Review of the MIP images confirms the above findings CTA HEAD FINDINGS Anterior circulation: Eccentric calcified plaque within the horizontal petrous right ICA with mild stenosis. Fairly heavy atheromatous burden throughout the cavernous/supraclinoid right ICA with  moderate to advanced atheromatous stenosis of up to approximately 75%. Right ICA terminus widely patent. On the left, petrous left ICA widely patent without stenosis. Atheromatous plaque throughout the left carotid siphon with moderate multifocal narrowing of up to approximately 50%. Left ICA terminus widely patent. A1 segments mildly irregular but patent bilaterally. Right A1 hypoplastic. Normal anterior communicating artery. Anterior cerebral arteries patent to their distal aspects without stenosis. No M1 stenosis or occlusion. No proximal M2 occlusion distal MCA branches patent bilaterally, although overall the right MCA and ACA are attenuated as compared to the left due to the proximal right ICA stenosis. Posterior circulation: Atheromatous irregularity within the distal right V4 segment without high-grade stenosis. Left V4 widely patent to the vertebrobasilar junction. Posterior inferior cerebral arteries patent bilaterally. Multifocal atheromatous irregularity throughout the basilar artery. Moderate stenoses of up to 50% seen involving the proximal and mid basilar artery. Superior cerebral arteries patent bilaterally. Left PCA supplied via the basilar. Fetal type origin of the right PCA. PCAs patent to their distal aspects without high-grade stenosis. Venous sinuses: Grossly patent, although not well assessed due to arterial timing of the contrast bolus. Anatomic variants: Fetal type origin of the right PCA. No intracranial aneurysm. Delayed phase: Not performed. Review of the MIP images confirms the above findings IMPRESSION: 1. 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 Secondary downstream attenuation with decreased flow throughout the right ACA and MCA territories as compared to the left. 2. Prominent carotid siphon atherosclerotic change with associated stenoses of up to 75% on the right and 50% on the  left. 3. 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 4. 50% stenosis at the origin of the left vertebral artery. 5. Fetal type right PCA. Electronically Signed   By: Rise Mu M.D.   On: 11/20/2017 20:05   Mr Maxine Glenn Head Wo Contrast  Result Date: 11/20/2017 CLINICAL DATA:  Left-sided weakness for several months. Numbness and tingling  in the extremities. EXAM: MRI HEAD WITHOUT CONTRAST MRA HEAD WITHOUT CONTRAST MRI CERVICAL SPINE WITHOUT CONTRAST TECHNIQUE: Multiplanar, multiecho pulse sequences of the brain and surrounding structures, and cervical spine, to include the craniocervical junction and cervicothoracic junction, were obtained without intravenous contrast. Angiographic images of the head were obtained using MRA technique without contrast. COMPARISON:  Head CT 11/20/2017 FINDINGS: MRI HEAD FINDINGS Brain: Small scattered acute to Saleah Rishel subacute cortical and white matter infarcts are present in the right cerebral hemisphere which appear to largely be along superficial and deep watershed zones. The largest confluent area of infarction involves 2 cm of right frontoparietal cortex at the vertex, corresponding to the abnormality described on CT. Multiple sagittally oriented infarcts are present in the right centrum semiovale. Small right parieto-occipital cortical infarcts are noted in the posterior MCA/watershed territory. There are a couple of punctate acute right temporal lobe cortical infarcts, and there is a small acute infarct involving the right basal ganglia and anterior limb of internal capsule. No acute left cerebral hemispheric or posterior fossa infarcts are identified. Chronic microhemorrhages are noted in the posterior left frontal lobe at the level of the corona radiata and in the right cerebellum. There are small cortical infarcts in the posterior right temporal lobe and lateral right occipital lobe as well as small white  matter infarcts in the right centrum semiovale which have a more late subacute to chronic appearance. Chronic lacunar infarcts are also noted in the basal ganglia bilaterally and in the right pons. The ventricles are normal in size. No mass, midline shift, or extra-axial fluid collection is present. Vascular: Abnormal appearance of the distal right internal carotid artery with some is centric. Skull and upper cervical spine: Unremarkable bone marrow signal. Sinuses/Orbits: Unremarkable orbits. Minimal mucosal thickening in the frontal, ethmoid, and right sphenoid sinuses. Clear mastoid air cells. Other: None. MRA HEAD FINDINGS The visualized distal vertebral arteries are patent to the basilar and codominant. There is mild left V4 stenosis at the vertebrobasilar junction. Patent PICA, AICA, and SCA origins are visualized bilaterally. The basilar artery is patent with motion artifact through its proximal portion and no evidence of significant stenosis. There is a predominantly fetal type origin of the right PCA, possibly with a diminutive and diseased right P1 segment. The large right posterior communicating artery is widely patent, as is the left PCA. The included distal cervical and intracranial segments of both internal carotid arteries are patent to the carotid termini. However, the right ICA is diffusely small in caliber compared to the left. There is abnormal signal diffusely surrounding the right ICA as it enters the skull base as well as throughout the petrous portion. Flow related enhancement is mildly diminished throughout the distal right petrous ICA extending to the petrous-cavernous junction, and there is superimposed mild additional narrowing throughout this region. The ACAs and MCAs are patent without evidence of proximal branch occlusion or significant proximal stenosis, although the right ACA and right MCA appears slightly attenuated with less robust flow diffusely compared to the contralateral side.  No aneurysm is identified. MRI CERVICAL SPINE FINDINGS The study is mildly motion degraded. Alignment: Cervical spine straightening.  No significant listhesis. Vertebrae: No fracture, suspicious osseous lesion, or significant marrow edema. Type 2 degenerative endplate changes from C5-T1. Cord: Normal signal and morphology. Posterior Fossa, vertebral arteries, paraspinal tissues: Preserved vertebral artery flow voids. Abnormal appearance of the proximal right ICA flow void. Unremarkable paraspinal soft tissues. Disc levels: C2-3: Asymmetric right facet arthrosis with likely ankylosis across the  joint. Mild uncovertebral spurring. Mild right neural foraminal stenosis. No spinal stenosis. C3-4: Moderate disc space narrowing. Disc bulging, uncovertebral spurring, and mild facet arthrosis result in mild spinal stenosis and moderate to severe bilateral neural foraminal stenosis. C4-5: Mild disc bulging, mild uncovertebral spurring, and mild facet arthrosis result in mild-to-moderate right and moderate left neural foraminal stenosis and borderline spinal stenosis. C5-6: Right greater than left uncovertebral hypertrophy results in severe right and moderate to severe left neural foraminal stenosis. Minimal disc bulging does not result in significant spinal stenosis. C6-7: Uncovertebral spurring results in moderate to severe bilateral neural foraminal stenosis. No spinal stenosis. C7-T1: Uncovertebral spurring and mild disc bulging result in moderate to severe bilateral neural foraminal stenosis without spinal stenosis. T1-2: Bilateral foraminal disc protrusions result in moderate bilateral neural foraminal stenosis without spinal stenosis. IMPRESSION: 1. 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 2. 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 3. Diffusely abnormal appearance of the distal  cervical and proximal intracranial portions of the right ICA including mild diffuse narrowing. Head and neck CTA is recommended to further evaluate for a possible dissection or flow limiting proximal stenosis. 4. Diffuse cervical spondylosis with moderate to severe multilevel neural foraminal stenosis as above. 5. Mild spinal stenosis at C3-4. Electronically Signed   By: Sebastian Ache M.D.   On: 11/20/2017 16:21   Mr Brain Wo Contrast  Result Date: 11/20/2017 CLINICAL DATA:  Left-sided weakness for several months. Numbness and tingling in the extremities. EXAM: MRI HEAD WITHOUT CONTRAST MRA HEAD WITHOUT CONTRAST MRI CERVICAL SPINE WITHOUT CONTRAST TECHNIQUE: Multiplanar, multiecho pulse sequences of the brain and surrounding structures, and cervical spine, to include the craniocervical junction and cervicothoracic junction, were obtained without intravenous contrast. Angiographic images of the head were obtained using MRA technique without contrast. COMPARISON:  Head CT 11/20/2017 FINDINGS: MRI HEAD FINDINGS Brain: Small scattered acute to Khallid Pasillas subacute cortical and white matter infarcts are present in the right cerebral hemisphere which appear to largely be along superficial and deep watershed zones. The largest confluent area of infarction involves 2 cm of right frontoparietal cortex at the vertex, corresponding to the abnormality described on CT. Multiple sagittally oriented infarcts are present in the right centrum semiovale. Small right parieto-occipital cortical infarcts are noted in the posterior MCA/watershed territory. There are a couple of punctate acute right temporal lobe cortical infarcts, and there is a small acute infarct involving the right basal ganglia and anterior limb of internal capsule. No acute left cerebral hemispheric or posterior fossa infarcts are identified. Chronic microhemorrhages are noted in the posterior left frontal lobe at the level of the corona radiata and in the right  cerebellum. There are small cortical infarcts in the posterior right temporal lobe and lateral right occipital lobe as well as small white matter infarcts in the right centrum semiovale which have a more late subacute to chronic appearance. Chronic lacunar infarcts are also noted in the basal ganglia bilaterally and in the right pons. The ventricles are normal in size. No mass, midline shift, or extra-axial fluid collection is present. Vascular: Abnormal appearance of the distal right internal carotid artery with some is centric. Skull and upper cervical spine: Unremarkable bone marrow signal. Sinuses/Orbits: Unremarkable orbits. Minimal mucosal thickening in the frontal, ethmoid, and right sphenoid sinuses. Clear mastoid air cells. Other: None. MRA HEAD FINDINGS The visualized distal vertebral arteries are patent to the basilar and codominant.  There is mild left V4 stenosis at the vertebrobasilar junction. Patent PICA, AICA, and SCA origins are visualized bilaterally. The basilar artery is patent with motion artifact through its proximal portion and no evidence of significant stenosis. There is a predominantly fetal type origin of the right PCA, possibly with a diminutive and diseased right P1 segment. The large right posterior communicating artery is widely patent, as is the left PCA. The included distal cervical and intracranial segments of both internal carotid arteries are patent to the carotid termini. However, the right ICA is diffusely small in caliber compared to the left. There is abnormal signal diffusely surrounding the right ICA as it enters the skull base as well as throughout the petrous portion. Flow related enhancement is mildly diminished throughout the distal right petrous ICA extending to the petrous-cavernous junction, and there is superimposed mild additional narrowing throughout this region. The ACAs and MCAs are patent without evidence of proximal branch occlusion or significant proximal  stenosis, although the right ACA and right MCA appears slightly attenuated with less robust flow diffusely compared to the contralateral side. No aneurysm is identified. MRI CERVICAL SPINE FINDINGS The study is mildly motion degraded. Alignment: Cervical spine straightening.  No significant listhesis. Vertebrae: No fracture, suspicious osseous lesion, or significant marrow edema. Type 2 degenerative endplate changes from C5-T1. Cord: Normal signal and morphology. Posterior Fossa, vertebral arteries, paraspinal tissues: Preserved vertebral artery flow voids. Abnormal appearance of the proximal right ICA flow void. Unremarkable paraspinal soft tissues. Disc levels: C2-3: Asymmetric right facet arthrosis with likely ankylosis across the joint. Mild uncovertebral spurring. Mild right neural foraminal stenosis. No spinal stenosis. C3-4: Moderate disc space narrowing. Disc bulging, uncovertebral spurring, and mild facet arthrosis result in mild spinal stenosis and moderate to severe bilateral neural foraminal stenosis. C4-5: Mild disc bulging, mild uncovertebral spurring, and mild facet arthrosis result in mild-to-moderate right and moderate left neural foraminal stenosis and borderline spinal stenosis. C5-6: Right greater than left uncovertebral hypertrophy results in severe right and moderate to severe left neural foraminal stenosis. Minimal disc bulging does not result in significant spinal stenosis. C6-7: Uncovertebral spurring results in moderate to severe bilateral neural foraminal stenosis. No spinal stenosis. C7-T1: Uncovertebral spurring and mild disc bulging result in moderate to severe bilateral neural foraminal stenosis without spinal stenosis. T1-2: Bilateral foraminal disc protrusions result in moderate bilateral neural foraminal stenosis without spinal stenosis. IMPRESSION: 1. Multiple small infarcts throughout the right cerebral hemisphere primarily in a watershed distribution and varying in age from acute  to late subacute/chronic. 2. 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 3. Diffusely abnormal appearance of the distal cervical and proximal intracranial portions of the right ICA including mild diffuse narrowing. Head and neck CTA is recommended to further evaluate for a possible dissection or flow limiting proximal stenosis. 4. Diffuse cervical spondylosis with moderate to severe multilevel neural foraminal stenosis as above. 5. Mild spinal stenosis at C3-4. Electronically Signed   By: Sebastian Ache M.D.   On: 11/20/2017 16:21   Mr Cervical Spine Wo Contrast  Result Date: 11/20/2017 CLINICAL DATA:  Left-sided weakness for several months. Numbness and tingling in the extremities. EXAM: MRI HEAD WITHOUT CONTRAST MRA HEAD WITHOUT CONTRAST MRI CERVICAL SPINE WITHOUT CONTRAST TECHNIQUE: Multiplanar, multiecho pulse sequences of the brain and surrounding structures, and cervical spine, to include the craniocervical junction and cervicothoracic junction, were obtained without intravenous contrast. Angiographic images of the head were obtained using MRA technique without contrast. COMPARISON:  Head CT 11/20/2017 FINDINGS: MRI  HEAD FINDINGS Brain: Small scattered acute to Shanise Balch subacute cortical and white matter infarcts are present in the right cerebral hemisphere which appear to largely be along superficial and deep watershed zones. The largest confluent area of infarction involves 2 cm of right frontoparietal cortex at the vertex, corresponding to the abnormality described on CT. Multiple sagittally oriented infarcts are present in the right centrum semiovale. Small right parieto-occipital cortical infarcts are noted in the posterior MCA/watershed territory. There are a couple of punctate acute right temporal lobe cortical infarcts, and there is a small acute infarct involving the right basal ganglia and anterior limb of internal capsule. No acute left cerebral hemispheric or posterior fossa  infarcts are identified. Chronic microhemorrhages are noted in the posterior left frontal lobe at the level of the corona radiata and in the right cerebellum. There are small cortical infarcts in the posterior right temporal lobe and lateral right occipital lobe as well as small white matter infarcts in the right centrum semiovale which have a more late subacute to chronic appearance. Chronic lacunar infarcts are also noted in the basal ganglia bilaterally and in the right pons. The ventricles are normal in size. No mass, midline shift, or extra-axial fluid collection is present. Vascular: Abnormal appearance of the distal right internal carotid artery with some is centric. Skull and upper cervical spine: Unremarkable bone marrow signal. Sinuses/Orbits: Unremarkable orbits. Minimal mucosal thickening in the frontal, ethmoid, and right sphenoid sinuses. Clear mastoid air cells. Other: None. MRA HEAD FINDINGS The visualized distal vertebral arteries are patent to the basilar and codominant. There is mild left V4 stenosis at the vertebrobasilar junction. Patent PICA, AICA, and SCA origins are visualized bilaterally. The basilar artery is patent with motion artifact through its proximal portion and no evidence of significant stenosis. There is a predominantly fetal type origin of the right PCA, possibly with a diminutive and diseased right P1 segment. The large right posterior communicating artery is widely patent, as is the left PCA. The included distal cervical and intracranial segments of both internal carotid arteries are patent to the carotid termini. However, the right ICA is diffusely small in caliber compared to the left. There is abnormal signal diffusely surrounding the right ICA as it enters the skull base as well as throughout the petrous portion. Flow related enhancement is mildly diminished throughout the distal right petrous ICA extending to the petrous-cavernous junction, and there is superimposed mild  additional narrowing throughout this region. The ACAs and MCAs are patent without evidence of proximal branch occlusion or significant proximal stenosis, although the right ACA and right MCA appears slightly attenuated with less robust flow diffusely compared to the contralateral side. No aneurysm is identified. MRI CERVICAL SPINE FINDINGS The study is mildly motion degraded. Alignment: Cervical spine straightening.  No significant listhesis. Vertebrae: No fracture, suspicious osseous lesion, or significant marrow edema. Type 2 degenerative endplate changes from C5-T1. Cord: Normal signal and morphology. Posterior Fossa, vertebral arteries, paraspinal tissues: Preserved vertebral artery flow voids. Abnormal appearance of the proximal right ICA flow void. Unremarkable paraspinal soft tissues. Disc levels: C2-3: Asymmetric right facet arthrosis with likely ankylosis across the joint. Mild uncovertebral spurring. Mild right neural foraminal stenosis. No spinal stenosis. C3-4: Moderate disc space narrowing. Disc bulging, uncovertebral spurring, and mild facet arthrosis result in mild spinal stenosis and moderate to severe bilateral neural foraminal stenosis. C4-5: Mild disc bulging, mild uncovertebral spurring, and mild facet arthrosis result in mild-to-moderate right and moderate left neural foraminal stenosis and borderline spinal stenosis.  C5-6: Right greater than left uncovertebral hypertrophy results in severe right and moderate to severe left neural foraminal stenosis. Minimal disc bulging does not result in significant spinal stenosis. C6-7: Uncovertebral spurring results in moderate to severe bilateral neural foraminal stenosis. No spinal stenosis. C7-T1: Uncovertebral spurring and mild disc bulging result in moderate to severe bilateral neural foraminal stenosis without spinal stenosis. T1-2: Bilateral foraminal disc protrusions result in moderate bilateral neural foraminal stenosis without spinal stenosis.  IMPRESSION: 1. 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 2. 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 3. Diffusely abnormal appearance of the distal cervical and proximal intracranial portions of the right ICA including mild diffuse narrowing. Head and neck CTA is recommended to further evaluate for a possible dissection or flow limiting proximal stenosis. 4. Diffuse cervical spondylosis with moderate to severe multilevel neural foraminal stenosis as above. 5. Mild spinal stenosis at C3-4. Electronically Signed   By: Sebastian Ache M.D.   On: 11/20/2017 16:21   Ct Angio Ao+bifem W & Or Wo Contrast  Result Date: 11/24/2017 CLINICAL DATA:  Retroperitoneal hematoma. EXAM: CT ANGIOGRAPHY OF ABDOMINAL AORTA WITH ILIOFEMORAL RUNOFF TECHNIQUE: Multidetector CT imaging of the abdomen, pelvis and lower extremities was performed using the standard protocol during bolus administration of intravenous contrast. Multiplanar CT image reconstructions and MIPs were obtained to evaluate the vascular anatomy. CONTRAST:  ISOVUE-370 IOPAMIDOL (ISOVUE-370) INJECTION 76% COMPARISON:  None. FINDINGS: VASCULAR Aorta: Atherosclerosis of abdominal aorta is noted without aneurysm or dissection. Celiac: Patent without evidence of aneurysm, dissection, vasculitis or significant stenosis. SMA: Patent without evidence of aneurysm, dissection, vasculitis or significant stenosis. Renals: Both renal arteries are patent without evidence of aneurysm, dissection, vasculitis, fibromuscular dysplasia or significant stenosis. IMA: Patent without evidence of aneurysm, dissection, vasculitis or significant stenosis. RIGHT Lower Extremity Inflow: Common, internal and external iliac arteries are patent without evidence of aneurysm, dissection, vasculitis or significant stenosis. Outflow: Common, superficial and  profunda femoral arteries and the popliteal artery are patent without evidence of aneurysm, dissection, vasculitis or significant stenosis. Runoff: All 3 trifurcation vessels are patent proximally, but not visualized distally, most likely due to timing of contrast bolus, as the patient has no symptoms of lower extremity ischemia, as reported by Dr. Arbie Cookey. LEFT Lower Extremity Inflow: Mild stenosis is seen involving the proximal portion of the left common iliac artery. Left external iliac artery is widely patent. There appears to be active arterial extravasation arising from the proximal portion of the left inferior epigastric artery or a branch there of. This is resulting in a large left-sided retroperitoneal hematoma. Outflow: The common femoral, profunda femoral, and proximal in middle portions of the left superficial femoral arteries are patent. However, limited opacification of the distal left superficial femoral artery and no opacification of popliteal artery is noted most likely due to timing of contrast bolus, as patient reportedly has no clinical signs or symptoms suggesting lower extremity ischemia according to Dr. Arbie Cookey. Runoff: No contrast opacification of the trifurcation arteries in the left calf are noted most likely due to contrast bolus timing. Veins: No obvious venous abnormality within the limitations of this arterial phase study. Review of the MIP images confirms the above findings. NON-VASCULAR Lower chest: No acute abnormality. Hepatobiliary: The liver appears normal. No biliary dilatation is noted. Gallbladder is not clearly visualized. Pancreas: Unremarkable. No pancreatic ductal dilatation or surrounding inflammatory changes. Spleen: Normal in size without focal  abnormality. Adrenals/Urinary Tract: Adrenal glands appear normal. No hydronephrosis or renal obstruction is noted. Anterior displacement of the left kidney is noted due to previously described left-sided retroperitoneal hematoma.  Urinary bladder is displaced to the right due to hematoma as well. Stomach/Bowel: Stomach is within normal limits. Appendix appears normal. No evidence of bowel wall thickening, distention, or inflammatory changes. Lymphatic: No definite adenopathy is noted. Reproductive: Prostate is unremarkable. Other: No hernia is noted. Musculoskeletal: No acute or significant osseous findings. IMPRESSION: VASCULAR Active arterial extravasation is seen arising from the proximal portion of the left inferior epigastric artery, or a proximal branch thereof, which results in large left-sided retroperitoneal hematoma which displaces the left kidney anteriorly and the urinary bladder to the right. Critical Value/emergent results were called by telephone at the time of interpretation on 11/24/2017 at 8:15 p.m. to Dr. Tawanna Cooler Oyuki Hogan , who verbally acknowledged these results. Mild stenosis is seen involving the proximal portion of the left common iliac artery. There is no opacification of the distal portions of the trifurcation arteries in the right calf, with no opacification of the left popliteal artery or calf arteries as well. Reportedly the patient has no clinical signs or symptoms suggesting lower extremity ischemia, and therefore this most likely is due to timing of the contrast bolus. Aortic Atherosclerosis (ICD10-I70.0). NON-VASCULAR Large left-sided retroperitoneal hematoma as described above, resulting in displacement of the left kidney anteriorly and the urinary bladder to the right. Electronically Signed   By: Lupita Raider, M.D.   On: 11/24/2017 20:33   Anti-infectives: Anti-infectives (From admission, onward)   Start     Dose/Rate Route Frequency Ordered Stop   11/24/17 1930  ceFAZolin (ANCEF) IVPB 2g/100 mL premix     2 g 200 mL/hr over 30 Minutes Intravenous Every 8 hours 11/24/17 1531 11/25/17 1129   11/24/17 1000  ceFAZolin (ANCEF) IVPB 2g/100 mL premix     2 g 200 mL/hr over 30 Minutes Intravenous 30 min pre-op  11/24/17 0808 11/24/17 1127   11/24/17 0809  ceFAZolin (ANCEF) IVPB 2g/100 mL premix  Status:  Discontinued     2 g 200 mL/hr over 30 Minutes Intravenous 30 min pre-op 11/24/17 0809 11/24/17 1442   11/24/17 0755  ceFAZolin (ANCEF) 2-4 GM/100ML-% IVPB    Note to Pharmacy:  Ray Church   : cabinet override      11/24/17 0755 11/24/17 1112      Assessment/Plan: s/p Procedure(s): EVACUATION OF LEFT GROIN HEMATOMA, REPAIR OF LEFT FEMORAL ARTERY BRACH (Left) Stable status postTCAR with postoperative left retroperitoneal hematoma.  Will transfer to 4 E.  DC Foley.  Feels that he needs to have a bowel movement.  Will give Dulcolax suppository.  Also reports some allergy type symptoms and will treat this as well.  Possible discharge tomorrow   LOS: 1 day   Donnamaria Shands 11/25/2017, 8:55 AM

## 2017-11-25 NOTE — Discharge Instructions (Signed)
° °[facility] ° °Discharge Instructions °  °Carotid Endarterectomy (CEA) ° °Please refer to the following instructions for your post-procedure care. Your surgeon or physician assistant will discuss any changes with you. ° °Activity ° °You are encouraged to walk as much as you can. You can slowly return to normal activities but must avoid strenuous activity and heavy lifting until your doctor tell you it's okay. Avoid activities such as vacuuming or swinging a golf club. You can drive after one week if you are comfortable and you are no longer taking prescription pain medications. It is normal to feel tired for serval weeks after your surgery. It is also normal to have difficulty with sleep habits, eating, and bowel movements after surgery. These will go away with time. ° °Bathing/Showering ° °Shower daily after you go home. Do not soak in a bathtub, hot tub, or swim until the incision heals completely. ° °Incision Care ° °Shower every day. Clean your incision with mild soap and water. Pat the area dry with a clean towel. You do not need a bandage unless otherwise instructed. Do not apply any ointments or creams to your incision. You may have skin glue on your incision. Do not peel it off. It will come off on its own in about one week. Your incision may feel thickened and raised for several weeks after your surgery. This is normal and the skin will soften over time.  ° °Wash the groin wound with soap and water daily and pat dry. (No tub bath-only shower)  Then put a dry gauze or washcloth there to keep this area dry daily and as needed.  Do not use Vaseline or neosporin on your incisions.  Only use soap and water on your incisions and then protect and keep dry. ° ° °For Men Only: It's okay to shave around the incision but do not shave the incision itself for 2 weeks. It is common to have numbness under your chin that could last for several months. ° °Diet ° °Resume your normal diet.  There are no special food restrictions following this procedure. A low fat/low cholesterol diet is recommended for all patients with vascular disease. In order to heal from your surgery, it is CRITICAL to get adequate nutrition. Your body requires vitamins, minerals, and protein. Vegetables are the best source of vitamins and minerals. Vegetables also provide the perfect balance of protein. Processed food has little nutritional value, so try to avoid this. ° °Medications ° °Resume taking all of your medications unless your doctor or physician assistant tells you not to. If your incision is causing pain, you may take over-the- counter pain relievers such as acetaminophen (Tylenol). If you were prescribed a stronger pain medication, please be aware these medications can cause nausea and constipation. Prevent nausea by taking the medication with a snack or meal. Avoid constipation by drinking plenty of fluids and eating foods with a high amount of fiber, such as fruits, vegetables, and grains.  °Do not take Tylenol if you are taking prescription pain medications. ° °Follow Up ° °Our office will schedule a follow up appointment 2-3 weeks following discharge. ° °Please call us immediately for any of the following conditions ° °• Increased pain, redness, drainage (pus) from your incision site. °• Fever of 101 degrees or higher. °• If you should develop stroke (slurred speech, difficulty swallowing, weakness on one side of your body, loss of vision) you should call 911 and go to the nearest emergency room. °•  °  Reduce your risk of vascular disease: ° °• Stop smoking. If you would like help call [facility] at 1-800-QUIT-NOW (1-800-784-8669) or [place] at 336-586-4000. °• Manage your cholesterol °• Maintain a desired weight °• Control your diabetes °• Keep your blood pressure down °•  °If you have any questions, please call the office at [phone]. ° °

## 2017-11-25 NOTE — Plan of Care (Signed)
  Problem: Education: Goal: Knowledge of General Education information will improve Outcome: Progressing   Problem: Clinical Measurements: Goal: Ability to maintain clinical measurements within normal limits will improve Outcome: Progressing Goal: Respiratory complications will improve Outcome: Progressing   Problem: Coping: Goal: Level of anxiety will decrease Outcome: Progressing   Problem: Pain Managment: Goal: General experience of comfort will improve Outcome: Progressing   Problem: Safety: Goal: Ability to remain free from injury will improve Outcome: Progressing   Problem: Clinical Measurements: Goal: Ability to maintain clinical measurements within normal limits will improve Outcome: Progressing   Problem: Skin Integrity: Goal: Demonstration of wound healing without infection will improve Outcome: Progressing   Problem: Clinical Measurements: Goal: Will remain free from infection Outcome: Not Progressing Goal: Diagnostic test results will improve Outcome: Not Progressing Goal: Cardiovascular complication will be avoided Outcome: Not Progressing   Problem: Clinical Measurements: Goal: Postoperative complications will be avoided or minimized Outcome: Not Progressing

## 2017-11-25 NOTE — Plan of Care (Signed)
  Problem: Clinical Measurements: Goal: Will remain free from infection Outcome: Progressing   Problem: Activity: Goal: Risk for activity intolerance will decrease Outcome: Progressing   Problem: Clinical Measurements: Goal: Ability to maintain clinical measurements within normal limits will improve Outcome: Progressing

## 2017-11-25 NOTE — Discharge Summary (Signed)
Discharge Summary     Nathan Hernandez 1961/02/02 57 y.o. male  [id number]  Admission Date: 11/24/2017  Discharge Date: 11/30/17  Physician: Nada LibmanBrabham, Vance W, MD  Admission Diagnosis: right carotid stenosis   HPI:   This is a 57 y.o. male presented to the ED with acute on chronic left UE weakness and decreased sensation.  He has noted weakness for about 6 month with sudden changes since Saturday.  He denise amaurosis and aphasia.His family was not aware and convinced him to come to the hospital.               He states he doses not go to doctors and does not take any prescription medications.  Past medical history includes: family history of CAD, HTN, Stroke and DM.  He is a smoker and drinks alcohol frequently.  He reports hx of dyslipidemia and was prescribed Lipitor in the past.    Hospital Course:  The patient was admitted to the hospital and taken to the operating room on 11/24/2017 and underwent right trans carotid artery revascularization (TCAR).  Findings:  95% right internal carotid artery stenosis stented to residual less than 10% stenosis (10 x 40 mm ENROUTE stent)    The pt tolerated the procedure well and was transported to the PACU in good condition.  Later that evening, MD called to see pt due to LLQ abdominal pain rated 10 out of 10.  He was also hypotensive with systolic BP around 80 and tachycardic.  He responded to fluid bolus.  A CT was obtained as there was concern for retroperitoneal hematoma.  The CT scan revealed a large left retroperitoneal hematoma with compression of bladder and mobilization of left kidney.  There was an active area of extravasation.  He was hemodynamically stable.  Dr. Arbie CookeyEarly discussed with pt need for emergent surgical exploration to evacuate hematoma and repair.   He was taken back to the operating room and underwent left groin exploration and evacuation of left retroperitoneal hematoma and ligation of tributary branch of the EIA.  He  tolerated well and was transferred to the recovery room in satisfactory condition.   By POD 1, he was transferred to the stepdown unit.  His foley catheter was removed.  He was given a dulcolax supp due to feeling the need to have a BM.     By POD 2, the pt was stable overall but not quite ready for discharge.  Mobilization was encouraged.    On POD 3, pt doing well.  His abdomen was soft with flank ecchymosis.  His incisions healing nicely.  His acute blood loss anemia was stable.  He is continued on his asa/plavix/statin.   On POD 4, pt says his abdomen was feeling uncomfortable and was feeling constipated.  He was given a suppository that had little effect.  He was ordered Lactulose and senna added as well.  Later that afternoon, I was asked by RN to see pt.  She states he had the lactulose and enema with no relief.  He did have some BM, but abdomen still firm and distended.  He feels nauseated but no vomiting at this time.  He says he's not passing any flatus lately.  He does not have an appetite.  Son says he did get up and walk a couple times and has been up to the chair earlier today.  Doubt he is having further bleeding as his vital signs are stable but will order CBC to compare to this morning.  Also have ordered a stat KUB.  Will start normal saline at 50cc/hr since he is not eating/drinking.  His CBC was stable and his KUB revealed moderate intestinal gas, no bowel obstruction.    On POD 5, his acute blood loss anemia remained stable.  His abdomen still had some distension but was considerably softer.  His nausea was resolved.  IVF were kept going for 6 hours as his intake was minimal.  He was continued on SCD's and not pharmacologic DVT prophylaxis due to high risk of bleeding.  He is encouraged to continue to mobilize.   On POD 6, pt was feeling much better and wants to go home and plan for after lunch.  His incisions continue to heal nicely.  His abdomen is less distended again today.     The remainder of the hospital course consisted of increasing mobilization and increasing intake of solids without difficulty.   Recent Labs    11/24/17 2215 11/25/17 0354  NA 142 139  K 4.9 4.0  CL 113* 110  CO2 18* 23  GLUCOSE 232* 153*  BUN 7 6  CALCIUM 7.5* 7.9*   Recent Labs    11/24/17 2215 11/25/17 0354  WBC 19.4* 20.6*  HGB 12.1* 13.0  HCT 37.3* 38.3*  PLT 134* 139*   No results for input(s): INR in the last 72 hours.     Discharge Diagnosis:  right carotid stenosis  Secondary Diagnosis: Patient Active Problem List   Diagnosis Date Noted  . Carotid artery disease (HCC) 11/25/2017  . Carotid stenosis 11/24/2017  . Hyperlipidemia 11/21/2017  . Marijuana use 11/21/2017  . 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 Dyslipidemia 11/20/2017  . Hypertension 11/20/2017  . Polysubstance abuse (HCC) 11/20/2017  . HLD (hyperlipidemia) 05/20/2014  . Osteoarthritis of right knee 11/27/2013  . Establishing care with new doctor, encounter for 04/05/2013  . Poor vision 04/05/2013  . High blood pressure 04/05/2013  . Knee pain, acute 04/05/2013   Past Medical History:  Diagnosis Date  . HLD (hyperlipidemia)   . Hypertension   . Osteoarthritis   . Polysubstance abuse (HCC)   . Stroke [facility])     Allergies as of 11/25/2017   No Known Allergies     Medication List    TAKE these medications   aspirin 325 MG tablet Take 1 tablet (325 mg total) by mouth daily.   atorvastatin 80 MG tablet Commonly known as:  LIPITOR Take 1 tablet (80 mg total) by mouth daily at 6 PM.   clopidogrel 75 MG tablet Commonly known as:  PLAVIX Take 1 tablet (75 mg total) by mouth daily.   nicotine 21 mg/24hr patch Commonly known as:  NICODERM CQ - dosed in mg/24 hours Place 1 patch (21 mg total) onto the skin daily.   NONFORMULARY OR COMPOUNDED ITEM Please evaluate and treat for outpatient physical therapy.  Diagnosis-CVA   oxyCODONE 5 MG immediate release  tablet Commonly known as:  Oxy IR/ROXICODONE Take 1 tablet (5 mg total) by mouth every 6 (six) hours as needed for moderate pain.        [facility] Discharge Instructions Carotid Endarterectomy (CEA)  Please refer to the following instructions for your post-procedure care. Your surgeon or physician assistant will discuss any changes with you.  Activity  You are encouraged to walk as much as you can. You can slowly return to normal activities but must avoid strenuous activity and heavy lifting until your doctor tell you  it's OK. Avoid activities such as vacuuming or swinging a golf club. You can drive after one week if you are comfortable and you are no longer taking prescription pain medications. It is normal to feel tired for serval weeks after your surgery. It is also normal to have difficulty with sleep habits, eating, and bowel movements after surgery. These will go away with time.  Bathing/Showering  You may shower after you come home. Do not soak in a bathtub, hot tub, or swim until the incision heals completely.  Incision Care  Shower every day. Clean your incision with mild soap and water. Pat the area dry with a clean towel. You do not need a bandage unless otherwise instructed. Do not apply any ointments or creams to your incision. You may have skin glue on your incision. Do not peel it off. It will come off on its own in about one week. Your incision may feel thickened and raised for several weeks after your surgery. This is normal and the skin will soften over time. For Men Only: It's OK to shave around the incision but do not shave the incision itself for 2 weeks. It is common to have numbness under your chin that could last for several months.  Wash the groin wound with soap and water daily and pat dry. (No tub bath-only shower)  Then put a dry gauze or washcloth there to keep this area dry daily and as needed.  Do not use Vaseline or neosporin  on your incisions.  Only use soap and water on your incisions and then protect and keep dry.  Diet  Resume your normal diet. There are no special food restrictions following this procedure. A low fat/low cholesterol diet is recommended for all patients with vascular disease. In order to heal from your surgery, it is CRITICAL to get adequate nutrition. Your body requires vitamins, minerals, and protein. Vegetables are the best source of vitamins and minerals. Vegetables also provide the perfect balance of protein. Processed food has little nutritional value, so try to avoid this.  Medications  Resume taking all of your medications unless your doctor or physician assistant tells you not to.  If your incision is causing pain, you may take over-the- counter pain relievers such as acetaminophen (Tylenol). If you were prescribed a stronger pain medication, please be aware these medications can cause nausea and constipation.  Prevent nausea by taking the medication with a snack or meal. Avoid constipation by drinking plenty of fluids and eating foods with a high amount of fiber, such as fruits, vegetables, and grains. Do not take Tylenol if you are taking prescription pain medications.  Follow Up  Our office will schedule a follow up appointment 2-3 weeks following discharge.  Please call us immediately for any of the following conditions  . Increased pain, redness, drainage (pus) from your incision site. . Fever of 101 degrees or higher. . If you should develop stroke (slurred speech, difficulty swallowing, weakness on one side of your body, loss of vision) you should call 911 and go to the nearest emergency room. .  Reduce your risk of vascular disease:  . Stop smoking. If you would like help call [facility] at 1-800-QUIT-NOW (5165139872) or [facility] at [phone]. . Manage your cholesterol . Maintain a desired weight . Control your diabetes . Keep your blood pressure down .  If you  have any questions, please call the office at [phone].  Prescriptions given: Roxicodone #15 No Refill  Disposition: home  Patient's condition: is Good  Follow up: 1. Dr. Darrick Penna in 3 weeks with carotid duplex   Doreatha Massed, PA-C [facility] [phone]   --- For [facility] Registry use ---   Modified Rankin score at D/C (0-6): 1  IV medication needed for:  1. Hypertension: No 2. Hypotension: No  Post-op Complications: Yes  1. Post-op CVA or TIA: No  If yes: Event classification (right eye, left eye, right cortical, left cortical, verterobasilar, other): n/a  If yes: Timing of event (intra-op, <6 hrs post-op, >=6 hrs post-op, unknown): n/a  2. CN injury: No  If yes: CN n/a injuried   3. Myocardial infarction: No  If yes: Dx by (EKG or clinical, Troponin): n/a  4.  CHF: No  5.  Dysrhythmia (new): No  6. Wound infection: No  7. Reperfusion symptoms: No  8. Return to OR: Yes   If yes: return to OR for (bleeding, neurologic, other CEA incision, other): retroperitoneal bleed  Discharge medications: Statin use:  Yes ASA use:  Yes   Beta blocker use:  No ACE-Inhibitor use:  No  ARB use:  No CCB use: No P2Y12 Antagonist use: Yes, [ x] Plavix, [ ]  Plasugrel, [ ]  Ticlopinine, [ ]  Ticagrelor, [ ]  Other, [ ]  No for medical reason, [ ]  Non-compliant, [ ]  Not-indicated Anti-coagulant use:  No, [ ]  Warfarin, [ ]  Rivaroxaban, [ ]  Dabigatran,

## 2017-11-26 NOTE — Progress Notes (Signed)
Subjective: Interval History: none.. Sitting up in chair.  Reports difficulty getting comfortable yesterday evening with a soreness in his left flank and left lower quadrant.  Objective: Vital signs in last 24 hours: Temp:  [98 F (36.7 C)-98.9 F (37.2 C)] 98 F (36.7 C) (06/23 0402) Pulse Rate:  [84] 84 (06/23 0402) Resp:  [14-18] 18 (06/23 0402) BP: (128-137)/(76-84) 128/83 (06/23 0402) SpO2:  [95 %-98 %] 95 % (06/23 0402)  Intake/Output from previous day: 06/22 0701 - 06/23 0700 In: 1566.6 [P.O.:1380; I.V.:186.6] Out: 570 [Urine:570] Intake/Output this shift: No intake/output data recorded.  Neurologically intact.  Right neck incision without hematoma.  Moderate tenderness in left abdomen.  Left groin incision healing.  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 EXAM: CT ANGIOGRAPHY HEAD AND NECK TECHNIQUE: Multidetector CT imaging of the head and neck was performed using the standard protocol during bolus administration of intravenous contrast. Multiplanar CT image reconstructions and MIPs were obtained to evaluate the vascular anatomy. Carotid stenosis measurements (when applicable) are obtained utilizing NASCET criteria, using the distal internal carotid diameter as the denominator. CONTRAST:  50mL ISOVUE-370 IOPAMIDOL (ISOVUE-370) INJECTION 76% COMPARISON:  Prior head CT and MRI from earlier the same day. FINDINGS: CTA NECK FINDINGS Aortic arch: Visualized aortic arch of normal  caliber with normal branch pattern. Mild aortic atherosclerosis without hemodynamically significant stenosis about the origin of the great vessels. Visualized subclavian arteries widely patent. Right carotid system: Right common carotid artery patent from its origin to the bifurcation without hemodynamically significant stenosis. Mixed atheromatous plaque at the proximal right ICA with severe near occlusive stenosis (series 7, image 191). A string sign is faintly visible on axial sequences. Stenosis begins approximately 4 mm distal to the bifurcation and measures approximately 1 cm in length. Right ICA widely patent distally to the skull base without additional stenosis or occlusion. Left carotid system: Scattered atheromatous plaque within the left common carotid artery without hemodynamically significant stenosis. A centric calcified plaque about the left bifurcation without significant stenosis. Left ICA patent distally to the skull base without stenosis, dissection, or occlusion. Vertebral arteries: Both of the vertebral arteries arise from the subclavian arteries. Focal plaque at the origin of the left vertebral artery with approximate 50% stenosis. Vertebral arteries otherwise widely patent within the neck. Skeleton: No acute osseous abnormality. No worrisome lytic or blastic osseous lesions. Multilevel degenerative disc disease and facet arthritis throughout the cervical spine, better evaluated on prior MRI. Other neck: Unremarkable. Upper chest: Unremarkable. Review of the MIP images confirms the above findings CTA HEAD FINDINGS Anterior circulation: Eccentric calcified plaque within the horizontal petrous right ICA with mild stenosis. Fairly heavy atheromatous burden throughout the cavernous/supraclinoid right ICA with moderate to advanced atheromatous stenosis of up to approximately 75%. Right ICA terminus widely patent. On the left, petrous left ICA widely patent without stenosis. Atheromatous plaque  throughout the left carotid siphon  with moderate multifocal narrowing of up to approximately 50%. Left ICA terminus widely patent. A1 segments mildly irregular but patent bilaterally. Right A1 hypoplastic. Normal anterior communicating artery. Anterior cerebral arteries patent to their distal aspects without stenosis. No M1 stenosis or occlusion. No proximal M2 occlusion distal MCA branches patent bilaterally, although overall the right MCA and ACA are attenuated as compared to the left due to the proximal right ICA stenosis. Posterior circulation: Atheromatous irregularity within the distal right V4 segment without high-grade stenosis. Left V4 widely patent to the vertebrobasilar junction. Posterior inferior cerebral arteries patent bilaterally. Multifocal atheromatous irregularity throughout the basilar artery. Moderate stenoses of up to 50% seen involving the proximal and mid basilar artery. Superior cerebral arteries patent bilaterally. Left PCA supplied via the basilar. Fetal type origin of the right PCA. PCAs patent to their distal aspects without high-grade stenosis. Venous sinuses: Grossly patent, although not well assessed due to arterial timing of the contrast bolus. Anatomic variants: Fetal type origin of the right PCA. No intracranial aneurysm. Delayed phase: Not performed. Review of the MIP images confirms the above findings IMPRESSION: 1. 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 Secondary downstream attenuation with decreased flow throughout the right ACA and MCA territories as compared to the left. 2. Prominent carotid siphon atherosclerotic change with associated stenoses of up to 75% on the right and 50% on the left. 3. 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 4. 50% stenosis at the origin of the left vertebral artery. 5. Fetal  type right PCA. Electronically Signed   By: Rise Mu M.D.   On: 11/20/2017 20:05   Ct Head Wo Contrast  Result Date: 11/20/2017 CLINICAL DATA:  Left-sided weakness the past few months. Numbness and tingling of the extremities. EXAM: CT HEAD WITHOUT CONTRAST TECHNIQUE: Contiguous axial images were obtained from the base of the skull through the vertex without intravenous contrast. COMPARISON:  CT head report dated January 26, 1999. FINDINGS: Brain: Focal loss of the normal gray-white matter differentiation in the right pre and postcentral gyri. No evidence of acute hemorrhage, hydrocephalus, extra-axial collection or mass lesion/mass effect. Old lacunar infarcts in the bilateral basal ganglia and right pons. Vascular: Atherosclerotic vascular calcification of the carotid siphons. No hyperdense vessel. Skull: Normal. Negative for fracture or focal lesion. Sinuses/Orbits: No acute finding. Other: None. IMPRESSION: 1. Loss of the normal gray-white matter differentiation in the right pre and postcentral gyri, suspicious for subacute infarct given clinical history. 2. Chronic appearing lacunar infarcts in the bilateral basal ganglia and right pons. Electronically Signed   By: Obie Dredge M.D.   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 EXAM: CT ANGIOGRAPHY HEAD AND NECK TECHNIQUE: Multidetector CT imaging of the head and neck was performed using the standard protocol during bolus administration of intravenous contrast. Multiplanar CT image reconstructions and MIPs were obtained to evaluate the vascular anatomy. Carotid stenosis measurements (when applicable) are obtained utilizing NASCET criteria, using the distal internal carotid diameter as the denominator. CONTRAST:  50mL ISOVUE-370 IOPAMIDOL (ISOVUE-370) INJECTION 76%  COMPARISON:  Prior head CT and MRI from earlier the same day. FINDINGS: CTA NECK FINDINGS Aortic arch: Visualized aortic arch of normal caliber with normal branch  pattern. Mild aortic atherosclerosis without hemodynamically significant stenosis about the origin of the great vessels. Visualized subclavian arteries widely patent. Right carotid system: Right common carotid artery patent from its origin to the bifurcation without hemodynamically significant stenosis. Mixed atheromatous plaque at the proximal right ICA with severe near occlusive stenosis (series 7, image 191). A string sign is faintly visible on axial sequences. Stenosis begins approximately 4 mm distal to the bifurcation and measures approximately 1 cm in length. Right ICA widely patent distally to the skull base without additional stenosis or occlusion. Left carotid system: Scattered atheromatous plaque within the left common carotid artery without hemodynamically significant stenosis. A centric calcified plaque about the left bifurcation without significant stenosis. Left ICA patent distally to the skull base without stenosis, dissection, or occlusion. Vertebral arteries: Both of the vertebral arteries arise from the subclavian arteries. Focal plaque at the origin of the left vertebral artery with approximate 50% stenosis. Vertebral arteries otherwise widely patent within the neck. Skeleton: No acute osseous abnormality. No worrisome lytic or blastic osseous lesions. Multilevel degenerative disc disease and facet arthritis throughout the cervical spine, better evaluated on prior MRI. Other neck: Unremarkable. Upper chest: Unremarkable. Review of the MIP images confirms the above findings CTA HEAD FINDINGS Anterior circulation: Eccentric calcified plaque within the horizontal petrous right ICA with mild stenosis. Fairly heavy atheromatous burden throughout the cavernous/supraclinoid right ICA with moderate to advanced atheromatous stenosis of up to  approximately 75%. Right ICA terminus widely patent. On the left, petrous left ICA widely patent without stenosis. Atheromatous plaque throughout the left carotid siphon with moderate multifocal narrowing of up to approximately 50%. Left ICA terminus widely patent. A1 segments mildly irregular but patent bilaterally. Right A1 hypoplastic. Normal anterior communicating artery. Anterior cerebral arteries patent to their distal aspects without stenosis. No M1 stenosis or occlusion. No proximal M2 occlusion distal MCA branches patent bilaterally, although overall the right MCA and ACA are attenuated as compared to the left due to the proximal right ICA stenosis. Posterior circulation: Atheromatous irregularity within the distal right V4 segment without high-grade stenosis. Left V4 widely patent to the vertebrobasilar junction. Posterior inferior cerebral arteries patent bilaterally. Multifocal atheromatous irregularity throughout the basilar artery. Moderate stenoses of up to 50% seen involving the proximal and mid basilar artery. Superior cerebral arteries patent bilaterally. Left PCA supplied via the basilar. Fetal type origin of the right PCA. PCAs patent to their distal aspects without high-grade stenosis. Venous sinuses: Grossly patent, although not well assessed due to arterial timing of the contrast bolus. Anatomic variants: Fetal type origin of the right PCA. No intracranial aneurysm. Delayed phase: Not performed. Review of the MIP images confirms the above findings IMPRESSION: 1. 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 Secondary downstream attenuation with decreased flow throughout the right ACA and MCA territories as compared to the left. 2. Prominent carotid siphon atherosclerotic change with associated stenoses of up to 75% on the right and 50% on the left. 3. Prominent atherosclerotic change  throughout the proximal and mid basilar artery with up to 50% stenoses. 4. 50% stenosis at the origin of the left vertebral artery. 5. Fetal type right PCA. Electronically Signed   By: Rise Mu M.D.   On: 11/20/2017 20:05   Mr Nathan Hernandez Head Wo Contrast  Result Date: 11/20/2017 CLINICAL DATA:  Left-sided weakness for several months. Numbness and tingling in the extremities. EXAM: MRI HEAD WITHOUT CONTRAST  MRA HEAD WITHOUT CONTRAST MRI CERVICAL SPINE WITHOUT CONTRAST TECHNIQUE: Multiplanar, multiecho pulse sequences of the brain and surrounding structures, and cervical spine, to include the craniocervical junction and cervicothoracic junction, were obtained without intravenous contrast. Angiographic images of the head were obtained using MRA technique without contrast. COMPARISON:  Head CT 11/20/2017 FINDINGS: MRI HEAD FINDINGS Brain: Small scattered acute to Haydon Dorris subacute cortical and white matter infarcts are present in the right cerebral hemisphere which appear to largely be along superficial and deep watershed zones. The largest confluent area of infarction involves 2 cm of right frontoparietal cortex at the vertex, corresponding to the abnormality described on CT. Multiple sagittally oriented infarcts are present in the right centrum semiovale. Small right parieto-occipital cortical infarcts are noted in the posterior MCA/watershed territory. There are a couple of punctate acute right temporal lobe cortical infarcts, and there is a small acute infarct involving the right basal ganglia and anterior limb of internal capsule. No acute left cerebral hemispheric or posterior fossa infarcts are identified. Chronic microhemorrhages are noted in the posterior left frontal lobe at the level of the corona radiata and in the right cerebellum. There are small cortical infarcts in the posterior right temporal lobe and lateral right occipital lobe as well as small white matter infarcts in the right centrum  semiovale which have a more late subacute to chronic appearance. Chronic lacunar infarcts are also noted in the basal ganglia bilaterally and in the right pons. The ventricles are normal in size. No mass, midline shift, or extra-axial fluid collection is present. Vascular: Abnormal appearance of the distal right internal carotid artery with some is centric. Skull and upper cervical spine: Unremarkable bone marrow signal. Sinuses/Orbits: Unremarkable orbits. Minimal mucosal thickening in the frontal, ethmoid, and right sphenoid sinuses. Clear mastoid air cells. Other: None. MRA HEAD FINDINGS The visualized distal vertebral arteries are patent to the basilar and codominant. There is mild left V4 stenosis at the vertebrobasilar junction. Patent PICA, AICA, and SCA origins are visualized bilaterally. The basilar artery is patent with motion artifact through its proximal portion and no evidence of significant stenosis. There is a predominantly fetal type origin of the right PCA, possibly with a diminutive and diseased right P1 segment. The large right posterior communicating artery is widely patent, as is the left PCA. The included distal cervical and intracranial segments of both internal carotid arteries are patent to the carotid termini. However, the right ICA is diffusely small in caliber compared to the left. There is abnormal signal diffusely surrounding the right ICA as it enters the skull base as well as throughout the petrous portion. Flow related enhancement is mildly diminished throughout the distal right petrous ICA extending to the petrous-cavernous junction, and there is superimposed mild additional narrowing throughout this region. The ACAs and MCAs are patent without evidence of proximal branch occlusion or significant proximal stenosis, although the right ACA and right MCA appears slightly attenuated with less robust flow diffusely compared to the contralateral side. No aneurysm is identified. MRI  CERVICAL SPINE FINDINGS The study is mildly motion degraded. Alignment: Cervical spine straightening.  No significant listhesis. Vertebrae: No fracture, suspicious osseous lesion, or significant marrow edema. Type 2 degenerative endplate changes from C5-T1. Cord: Normal signal and morphology. Posterior Fossa, vertebral arteries, paraspinal tissues: Preserved vertebral artery flow voids. Abnormal appearance of the proximal right ICA flow void. Unremarkable paraspinal soft tissues. Disc levels: C2-3: Asymmetric right facet arthrosis with likely ankylosis across the joint. Mild uncovertebral spurring. Mild right neural foraminal  stenosis. No spinal stenosis. C3-4: Moderate disc space narrowing. Disc bulging, uncovertebral spurring, and mild facet arthrosis result in mild spinal stenosis and moderate to severe bilateral neural foraminal stenosis. C4-5: Mild disc bulging, mild uncovertebral spurring, and mild facet arthrosis result in mild-to-moderate right and moderate left neural foraminal stenosis and borderline spinal stenosis. C5-6: Right greater than left uncovertebral hypertrophy results in severe right and moderate to severe left neural foraminal stenosis. Minimal disc bulging does not result in significant spinal stenosis. C6-7: Uncovertebral spurring results in moderate to severe bilateral neural foraminal stenosis. No spinal stenosis. C7-T1: Uncovertebral spurring and mild disc bulging result in moderate to severe bilateral neural foraminal stenosis without spinal stenosis. T1-2: Bilateral foraminal disc protrusions result in moderate bilateral neural foraminal stenosis without spinal stenosis. IMPRESSION: 1. 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 2. 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 3. Diffusely abnormal appearance of the distal cervical and proximal intracranial  portions of the right ICA including mild diffuse narrowing. Head and neck CTA is recommended to further evaluate for a possible dissection or flow limiting proximal stenosis. 4. Diffuse cervical spondylosis with moderate to severe multilevel neural foraminal stenosis as above. 5. Mild spinal stenosis at C3-4. Electronically Signed   By: Sebastian Ache M.D.   On: 11/20/2017 16:21   Mr Brain Wo Contrast  Result Date: 11/20/2017 CLINICAL DATA:  Left-sided weakness for several months. Numbness and tingling in the extremities. EXAM: MRI HEAD WITHOUT CONTRAST MRA HEAD WITHOUT CONTRAST MRI CERVICAL SPINE WITHOUT CONTRAST TECHNIQUE: Multiplanar, multiecho pulse sequences of the brain and surrounding structures, and cervical spine, to include the craniocervical junction and cervicothoracic junction, were obtained without intravenous contrast. Angiographic images of the head were obtained using MRA technique without contrast. COMPARISON:  Head CT 11/20/2017 FINDINGS: MRI HEAD FINDINGS Brain: Small scattered acute to Rikki Trosper subacute cortical and white matter infarcts are present in the right cerebral hemisphere which appear to largely be along superficial and deep watershed zones. The largest confluent area of infarction involves 2 cm of right frontoparietal cortex at the vertex, corresponding to the abnormality described on CT. Multiple sagittally oriented infarcts are present in the right centrum semiovale. Small right parieto-occipital cortical infarcts are noted in the posterior MCA/watershed territory. There are a couple of punctate acute right temporal lobe cortical infarcts, and there is a small acute infarct involving the right basal ganglia and anterior limb of internal capsule. No acute left cerebral hemispheric or posterior fossa infarcts are identified. Chronic microhemorrhages are noted in the posterior left frontal lobe at the level of the corona radiata and in the right cerebellum. There are small cortical  infarcts in the posterior right temporal lobe and lateral right occipital lobe as well as small white matter infarcts in the right centrum semiovale which have a more late subacute to chronic appearance. Chronic lacunar infarcts are also noted in the basal ganglia bilaterally and in the right pons. The ventricles are normal in size. No mass, midline shift, or extra-axial fluid collection is present. Vascular: Abnormal appearance of the distal right internal carotid artery with some is centric. Skull and upper cervical spine: Unremarkable bone marrow signal. Sinuses/Orbits: Unremarkable orbits. Minimal mucosal thickening in the frontal, ethmoid, and right sphenoid sinuses. Clear mastoid air cells. Other: None. MRA HEAD FINDINGS The visualized distal vertebral arteries are patent to the basilar and codominant. There is mild left V4 stenosis at the  vertebrobasilar junction. Patent PICA, AICA, and SCA origins are visualized bilaterally. The basilar artery is patent with motion artifact through its proximal portion and no evidence of significant stenosis. There is a predominantly fetal type origin of the right PCA, possibly with a diminutive and diseased right P1 segment. The large right posterior communicating artery is widely patent, as is the left PCA. The included distal cervical and intracranial segments of both internal carotid arteries are patent to the carotid termini. However, the right ICA is diffusely small in caliber compared to the left. There is abnormal signal diffusely surrounding the right ICA as it enters the skull base as well as throughout the petrous portion. Flow related enhancement is mildly diminished throughout the distal right petrous ICA extending to the petrous-cavernous junction, and there is superimposed mild additional narrowing throughout this region. The ACAs and MCAs are patent without evidence of proximal branch occlusion or significant proximal stenosis, although the right ACA and  right MCA appears slightly attenuated with less robust flow diffusely compared to the contralateral side. No aneurysm is identified. MRI CERVICAL SPINE FINDINGS The study is mildly motion degraded. Alignment: Cervical spine straightening.  No significant listhesis. Vertebrae: No fracture, suspicious osseous lesion, or significant marrow edema. Type 2 degenerative endplate changes from C5-T1. Cord: Normal signal and morphology. Posterior Fossa, vertebral arteries, paraspinal tissues: Preserved vertebral artery flow voids. Abnormal appearance of the proximal right ICA flow void. Unremarkable paraspinal soft tissues. Disc levels: C2-3: Asymmetric right facet arthrosis with likely ankylosis across the joint. Mild uncovertebral spurring. Mild right neural foraminal stenosis. No spinal stenosis. C3-4: Moderate disc space narrowing. Disc bulging, uncovertebral spurring, and mild facet arthrosis result in mild spinal stenosis and moderate to severe bilateral neural foraminal stenosis. C4-5: Mild disc bulging, mild uncovertebral spurring, and mild facet arthrosis result in mild-to-moderate right and moderate left neural foraminal stenosis and borderline spinal stenosis. C5-6: Right greater than left uncovertebral hypertrophy results in severe right and moderate to severe left neural foraminal stenosis. Minimal disc bulging does not result in significant spinal stenosis. C6-7: Uncovertebral spurring results in moderate to severe bilateral neural foraminal stenosis. No spinal stenosis. C7-T1: Uncovertebral spurring and mild disc bulging result in moderate to severe bilateral neural foraminal stenosis without spinal stenosis. T1-2: Bilateral foraminal disc protrusions result in moderate bilateral neural foraminal stenosis without spinal stenosis. IMPRESSION: 1. 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 2. Chronic  bilateral basal ganglia and pontine lacunar infarcts. 3. Diffusely abnormal appearance of the distal cervical and proximal intracranial portions of the right ICA including mild diffuse narrowing. Head and neck CTA is recommended to further evaluate for a possible dissection or flow limiting proximal stenosis. 4. Diffuse cervical spondylosis with moderate to severe multilevel neural foraminal stenosis as above. 5. Mild spinal stenosis at C3-4. Electronically Signed   By: Sebastian Ache M.D.   On: 11/20/2017 16:21   Mr Cervical Spine Wo Contrast  Result Date: 11/20/2017 CLINICAL DATA:  Left-sided weakness for several months. Numbness and tingling in the extremities. EXAM: MRI HEAD WITHOUT CONTRAST MRA HEAD WITHOUT CONTRAST MRI CERVICAL SPINE WITHOUT CONTRAST TECHNIQUE: Multiplanar, multiecho pulse sequences of the brain and surrounding structures, and cervical spine, to include the craniocervical junction and cervicothoracic junction, were obtained without intravenous contrast. Angiographic images of the head were obtained using MRA technique without contrast. COMPARISON:  Head CT 11/20/2017 FINDINGS: MRI HEAD FINDINGS Brain: Small scattered acute to Meyli Boice  subacute cortical and white matter infarcts are present in the right cerebral hemisphere which appear to largely be along superficial and deep watershed zones. The largest confluent area of infarction involves 2 cm of right frontoparietal cortex at the vertex, corresponding to the abnormality described on CT. Multiple sagittally oriented infarcts are present in the right centrum semiovale. Small right parieto-occipital cortical infarcts are noted in the posterior MCA/watershed territory. There are a couple of punctate acute right temporal lobe cortical infarcts, and there is a small acute infarct involving the right basal ganglia and anterior limb of internal capsule. No acute left cerebral hemispheric or posterior fossa infarcts are identified. Chronic  microhemorrhages are noted in the posterior left frontal lobe at the level of the corona radiata and in the right cerebellum. There are small cortical infarcts in the posterior right temporal lobe and lateral right occipital lobe as well as small white matter infarcts in the right centrum semiovale which have a more late subacute to chronic appearance. Chronic lacunar infarcts are also noted in the basal ganglia bilaterally and in the right pons. The ventricles are normal in size. No mass, midline shift, or extra-axial fluid collection is present. Vascular: Abnormal appearance of the distal right internal carotid artery with some is centric. Skull and upper cervical spine: Unremarkable bone marrow signal. Sinuses/Orbits: Unremarkable orbits. Minimal mucosal thickening in the frontal, ethmoid, and right sphenoid sinuses. Clear mastoid air cells. Other: None. MRA HEAD FINDINGS The visualized distal vertebral arteries are patent to the basilar and codominant. There is mild left V4 stenosis at the vertebrobasilar junction. Patent PICA, AICA, and SCA origins are visualized bilaterally. The basilar artery is patent with motion artifact through its proximal portion and no evidence of significant stenosis. There is a predominantly fetal type origin of the right PCA, possibly with a diminutive and diseased right P1 segment. The large right posterior communicating artery is widely patent, as is the left PCA. The included distal cervical and intracranial segments of both internal carotid arteries are patent to the carotid termini. However, the right ICA is diffusely small in caliber compared to the left. There is abnormal signal diffusely surrounding the right ICA as it enters the skull base as well as throughout the petrous portion. Flow related enhancement is mildly diminished throughout the distal right petrous ICA extending to the petrous-cavernous junction, and there is superimposed mild additional narrowing throughout  this region. The ACAs and MCAs are patent without evidence of proximal branch occlusion or significant proximal stenosis, although the right ACA and right MCA appears slightly attenuated with less robust flow diffusely compared to the contralateral side. No aneurysm is identified. MRI CERVICAL SPINE FINDINGS The study is mildly motion degraded. Alignment: Cervical spine straightening.  No significant listhesis. Vertebrae: No fracture, suspicious osseous lesion, or significant marrow edema. Type 2 degenerative endplate changes from C5-T1. Cord: Normal signal and morphology. Posterior Fossa, vertebral arteries, paraspinal tissues: Preserved vertebral artery flow voids. Abnormal appearance of the proximal right ICA flow void. Unremarkable paraspinal soft tissues. Disc levels: C2-3: Asymmetric right facet arthrosis with likely ankylosis across the joint. Mild uncovertebral spurring. Mild right neural foraminal stenosis. No spinal stenosis. C3-4: Moderate disc space narrowing. Disc bulging, uncovertebral spurring, and mild facet arthrosis result in mild spinal stenosis and moderate to severe bilateral neural foraminal stenosis. C4-5: Mild disc bulging, mild uncovertebral spurring, and mild facet arthrosis result in mild-to-moderate right and moderate left neural foraminal stenosis and borderline spinal stenosis. C5-6: Right greater than left uncovertebral hypertrophy results  in severe right and moderate to severe left neural foraminal stenosis. Minimal disc bulging does not result in significant spinal stenosis. C6-7: Uncovertebral spurring results in moderate to severe bilateral neural foraminal stenosis. No spinal stenosis. C7-T1: Uncovertebral spurring and mild disc bulging result in moderate to severe bilateral neural foraminal stenosis without spinal stenosis. T1-2: Bilateral foraminal disc protrusions result in moderate bilateral neural foraminal stenosis without spinal stenosis. IMPRESSION: 1. Multiple small  infarcts throughout the right cerebral hemisphere primarily in a watershed distribution and varying in age from acute to late subacute/chronic. 2. 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 3. Diffusely abnormal appearance of the distal cervical and proximal intracranial portions of the right ICA including mild diffuse narrowing. Head and neck CTA is recommended to further evaluate for a possible dissection or flow limiting proximal stenosis. 4. Diffuse cervical spondylosis with moderate to severe multilevel neural foraminal stenosis as above. 5. Mild spinal stenosis at C3-4. Electronically Signed   By: Sebastian Ache M.D.   On: 11/20/2017 16:21   Ct Angio Ao+bifem W & Or Wo Contrast  Result Date: 11/24/2017 CLINICAL DATA:  Retroperitoneal hematoma. EXAM: CT ANGIOGRAPHY OF ABDOMINAL AORTA WITH ILIOFEMORAL RUNOFF TECHNIQUE: Multidetector CT imaging of the abdomen, pelvis and lower extremities was performed using the standard protocol during bolus administration of intravenous contrast. Multiplanar CT image reconstructions and MIPs were obtained to evaluate the vascular anatomy. CONTRAST:  ISOVUE-370 IOPAMIDOL (ISOVUE-370) INJECTION 76% COMPARISON:  None. FINDINGS: VASCULAR Aorta: Atherosclerosis of abdominal aorta is noted without aneurysm or dissection. Celiac: Patent without evidence of aneurysm, dissection, vasculitis or significant stenosis. SMA: Patent without evidence of aneurysm, dissection, vasculitis or significant stenosis. Renals: Both renal arteries are patent without evidence of aneurysm, dissection, vasculitis, fibromuscular dysplasia or significant stenosis. IMA: Patent without evidence of aneurysm, dissection, vasculitis or significant stenosis. RIGHT Lower Extremity Inflow: Common, internal and external iliac arteries are patent without evidence of aneurysm, dissection, vasculitis or significant stenosis. Outflow: Common, superficial and profunda femoral arteries and the  popliteal artery are patent without evidence of aneurysm, dissection, vasculitis or significant stenosis. Runoff: All 3 trifurcation vessels are patent proximally, but not visualized distally, most likely due to timing of contrast bolus, as the patient has no symptoms of lower extremity ischemia, as reported by Dr. Arbie Cookey. LEFT Lower Extremity Inflow: Mild stenosis is seen involving the proximal portion of the left common iliac artery. Left external iliac artery is widely patent. There appears to be active arterial extravasation arising from the proximal portion of the left inferior epigastric artery or a branch there of. This is resulting in a large left-sided retroperitoneal hematoma. Outflow: The common femoral, profunda femoral, and proximal in middle portions of the left superficial femoral arteries are patent. However, limited opacification of the distal left superficial femoral artery and no opacification of popliteal artery is noted most likely due to timing of contrast bolus, as patient reportedly has no clinical signs or symptoms suggesting lower extremity ischemia according to Dr. Arbie Cookey. Runoff: No contrast opacification of the trifurcation arteries in the left calf are noted most likely due to contrast bolus timing. Veins: No obvious venous abnormality within the limitations of this arterial phase study. Review of the MIP images confirms the above findings. NON-VASCULAR Lower chest: No acute abnormality. Hepatobiliary: The liver appears normal. No biliary dilatation is noted. Gallbladder is not clearly visualized. Pancreas: Unremarkable. No pancreatic ductal dilatation or surrounding inflammatory changes. Spleen: Normal in size without focal abnormality. Adrenals/Urinary Tract: Adrenal glands appear normal. No  hydronephrosis or renal obstruction is noted. Anterior displacement of the left kidney is noted due to previously described left-sided retroperitoneal hematoma. Urinary bladder is displaced to the  right due to hematoma as well. Stomach/Bowel: Stomach is within normal limits. Appendix appears normal. No evidence of bowel wall thickening, distention, or inflammatory changes. Lymphatic: No definite adenopathy is noted. Reproductive: Prostate is unremarkable. Other: No hernia is noted. Musculoskeletal: No acute or significant osseous findings. IMPRESSION: VASCULAR Active arterial extravasation is seen arising from the proximal portion of the left inferior epigastric artery, or a proximal branch thereof, which results in large left-sided retroperitoneal hematoma which displaces the left kidney anteriorly and the urinary bladder to the right. Critical Value/emergent results were called by telephone at the time of interpretation on 11/24/2017 at 8:15 p.m. to Dr. Tawanna CoolerDD Deral Schellenberg , who verbally acknowledged these results. Mild stenosis is seen involving the proximal portion of the left common iliac artery. There is no opacification of the distal portions of the trifurcation arteries in the right calf, with no opacification of the left popliteal artery or calf arteries as well. Reportedly the patient has no clinical signs or symptoms suggesting lower extremity ischemia, and therefore this most likely is due to timing of the contrast bolus. Aortic Atherosclerosis (ICD10-I70.0). NON-VASCULAR Large left-sided retroperitoneal hematoma as described above, resulting in displacement of the left kidney anteriorly and the urinary bladder to the right. Electronically Signed   By: Lupita RaiderJames  Green Jr, M.D.   On: 11/24/2017 20:33   Anti-infectives: Anti-infectives (From admission, onward)   Start     Dose/Rate Route Frequency Ordered Stop   11/24/17 1930  ceFAZolin (ANCEF) IVPB 2g/100 mL premix     2 g 200 mL/hr over 30 Minutes Intravenous Every 8 hours 11/24/17 1531 11/25/17 1129   11/24/17 1000  ceFAZolin (ANCEF) IVPB 2g/100 mL premix     2 g 200 mL/hr over 30 Minutes Intravenous 30 min pre-op 11/24/17 0808 11/24/17 1127    11/24/17 0809  ceFAZolin (ANCEF) IVPB 2g/100 mL premix  Status:  Discontinued     2 g 200 mL/hr over 30 Minutes Intravenous 30 min pre-op 11/24/17 0809 11/24/17 1442   11/24/17 0755  ceFAZolin (ANCEF) 2-4 GM/100ML-% IVPB    Note to Pharmacy:  Ray ChurchBowman, Jennifer   : cabinet override      11/24/17 0755 11/24/17 1112      Assessment/Plan: s/p Procedure(s): EVACUATION OF LEFT GROIN HEMATOMA, REPAIR OF LEFT FEMORAL ARTERY BRACH (Left) Stable overall.  Patient feels he is not quite ready for discharge and I agree.  Will mobilize today and plan to discharge in the morning.  Check CBC in the morning   LOS: 2 days   Tikia Skilton 11/26/2017, 8:40 AM

## 2017-11-27 ENCOUNTER — Other Ambulatory Visit: Payer: Self-pay | Admitting: *Deleted

## 2017-11-27 ENCOUNTER — Telehealth: Payer: Self-pay | Admitting: Vascular Surgery

## 2017-11-27 ENCOUNTER — Encounter (HOSPITAL_COMMUNITY): Payer: Self-pay | Admitting: Surgery

## 2017-11-27 LAB — POCT I-STAT 7, (LYTES, BLD GAS, ICA,H+H)
ACID-BASE DEFICIT: 18 mmol/L — AB (ref 0.0–2.0)
Bicarbonate: 13 mmol/L — ABNORMAL LOW (ref 20.0–28.0)
CALCIUM ION: 0.96 mmol/L — AB (ref 1.15–1.40)
HEMATOCRIT: 28 % — AB (ref 39.0–52.0)
Hemoglobin: 9.5 g/dL — ABNORMAL LOW (ref 13.0–17.0)
O2 SAT: 100 %
PH ART: 7.016 — AB (ref 7.350–7.450)
PO2 ART: 260 mmHg — AB (ref 83.0–108.0)
Patient temperature: 35
Potassium: 5.7 mmol/L — ABNORMAL HIGH (ref 3.5–5.1)
SODIUM: 140 mmol/L (ref 135–145)
TCO2: 15 mmol/L — ABNORMAL LOW (ref 22–32)
pCO2 arterial: 49.3 mmHg — ABNORMAL HIGH (ref 32.0–48.0)

## 2017-11-27 LAB — CBC
HEMATOCRIT: 26.9 % — AB (ref 39.0–52.0)
HEMOGLOBIN: 8.9 g/dL — AB (ref 13.0–17.0)
MCH: 28.9 pg (ref 26.0–34.0)
MCHC: 33.1 g/dL (ref 30.0–36.0)
MCV: 87.3 fL (ref 78.0–100.0)
Platelets: 144 10*3/uL — ABNORMAL LOW (ref 150–400)
RBC: 3.08 MIL/uL — ABNORMAL LOW (ref 4.22–5.81)
RDW: 14.6 % (ref 11.5–15.5)
WBC: 12.7 10*3/uL — ABNORMAL HIGH (ref 4.0–10.5)

## 2017-11-27 LAB — POCT ACTIVATED CLOTTING TIME: Activated Clotting Time: 351 seconds

## 2017-11-27 MED ORDER — OXYCODONE HCL 5 MG PO TABS: 5.0000 mg | ORAL_TABLET | Freq: Four times a day (QID) | ORAL | 0 refills | Status: DC | PRN

## 2017-11-27 MED ORDER — CLOPIDOGREL BISULFATE 75 MG PO TABS: 75.0000 mg | ORAL_TABLET | Freq: Every day | ORAL | 1 refills | Status: DC

## 2017-11-27 MED ORDER — ATORVASTATIN CALCIUM 80 MG PO TABS: 80.0000 mg | ORAL_TABLET | Freq: Every day | ORAL | 3 refills | Status: DC

## 2017-11-27 NOTE — Telephone Encounter (Signed)
sch appt lvm 12/21/17 3pm Carotid + 345pm p/o MD

## 2017-11-27 NOTE — Progress Notes (Signed)
    Subjective  -   Feels better this am   Physical Exam:  Abdomen soft with flank ecchymosis Left groin and right neck incision intact without drainage or separation    Assessment/Plan:    S/P TCAR complicated by RP hematoma.  Plan for d/c home today  ASA, Plavix, Statin Acute blood loss anemia: stable   Wells Brabham 11/27/2017 8:57 AM --  Vitals:   11/27/17 0800 11/27/17 0830  BP:    Pulse:    Resp: 15 (!) 22  Temp:    SpO2:      Intake/Output Summary (Last 24 hours) at 11/27/2017 0857 Last data filed at 11/27/2017 0830 Gross per 24 hour  Intake 200 ml  Output 1350 ml  Net -1150 ml     Laboratory CBC    Component Value Date/Time   WBC 12.7 (H) 11/27/2017 0321   HGB 8.9 (L) 11/27/2017 0321   HCT 26.9 (L) 11/27/2017 0321   PLT 144 (L) 11/27/2017 0321    BMET    Component Value Date/Time   NA 139 11/25/2017 0354   K 4.0 11/25/2017 0354   CL 110 11/25/2017 0354   CO2 23 11/25/2017 0354   GLUCOSE 153 (H) 11/25/2017 0354   BUN 6 11/25/2017 0354   CREATININE 0.79 11/25/2017 0354   CREATININE 0.83 05/15/2014 0904   CALCIUM 7.9 (L) 11/25/2017 0354   GFRNONAA >60 11/25/2017 0354   GFRNONAA >89 05/15/2014 0904   GFRAA >60 11/25/2017 0354   GFRAA >89 05/15/2014 0904    COAG Lab Results  Component Value Date   INR 1.00 11/20/2017   No results found for: PTT  Antibiotics Anti-infectives (From admission, onward)   Start     Dose/Rate Route Frequency Ordered Stop   11/24/17 1930  ceFAZolin (ANCEF) IVPB 2g/100 mL premix     2 g 200 mL/hr over 30 Minutes Intravenous Every 8 hours 11/24/17 1531 11/25/17 1129   11/24/17 1000  ceFAZolin (ANCEF) IVPB 2g/100 mL premix     2 g 200 mL/hr over 30 Minutes Intravenous 30 min pre-op 11/24/17 0808 11/24/17 1127   11/24/17 0809  ceFAZolin (ANCEF) IVPB 2g/100 mL premix  Status:  Discontinued     2 g 200 mL/hr over 30 Minutes Intravenous 30 min pre-op 11/24/17 0809 11/24/17 1442   11/24/17 0755  ceFAZolin  (ANCEF) 2-4 GM/100ML-% IVPB    Note to Pharmacy:  Ray ChurchBowman, Jennifer   : cabinet override      11/24/17 0755 11/24/17 1112       V. Charlena CrossWells Brabham IV, M.D. [facility] [place] Office: [phone] Pager:  [phone]

## 2017-11-27 NOTE — Patient Outreach (Signed)
[facility] Care Management  11/27/2017  Nathan LegacyMichael Hernandez 1960/12/12 [id number]  Referral via RED Alert-EMMI-Stroke, Day# 1, 11/23/2017; Reason: Been able to take every dose of meds-No  Telephone call to patient; left HIPPA compliant voice mail requesting call back.  Plan: Follow up 2-4 days.  Colleen CanLinda Cambelle Suchecki, RN BSN CCM Care Management Coordinator [facility]  [phone]

## 2017-11-28 ENCOUNTER — Inpatient Hospital Stay (HOSPITAL_COMMUNITY): Payer: Medicaid Other

## 2017-11-28 ENCOUNTER — Other Ambulatory Visit: Payer: Self-pay

## 2017-11-28 DIAGNOSIS — Z48812 Encounter for surgical aftercare following surgery on the circulatory system: Secondary | ICD-10-CM

## 2017-11-28 DIAGNOSIS — I6529 Occlusion and stenosis of unspecified carotid artery: Secondary | ICD-10-CM

## 2017-11-28 LAB — CBC WITH DIFFERENTIAL/PLATELET
Abs Immature Granulocytes: 0.1 10*3/uL (ref 0.0–0.1)
Basophils Absolute: 0 10*3/uL (ref 0.0–0.1)
Basophils Relative: 0 %
EOS ABS: 0.1 10*3/uL (ref 0.0–0.7)
EOS PCT: 1 %
HEMATOCRIT: 29.2 % — AB (ref 39.0–52.0)
HEMOGLOBIN: 9.7 g/dL — AB (ref 13.0–17.0)
Immature Granulocytes: 0 %
LYMPHS ABS: 1.3 10*3/uL (ref 0.7–4.0)
LYMPHS PCT: 8 %
MCH: 29 pg (ref 26.0–34.0)
MCHC: 33.2 g/dL (ref 30.0–36.0)
MCV: 87.4 fL (ref 78.0–100.0)
MONO ABS: 3 10*3/uL — AB (ref 0.1–1.0)
Monocytes Relative: 18 %
Neutro Abs: 12.1 10*3/uL — ABNORMAL HIGH (ref 1.7–7.7)
Neutrophils Relative %: 73 %
Platelets: 227 10*3/uL (ref 150–400)
RBC: 3.34 MIL/uL — ABNORMAL LOW (ref 4.22–5.81)
RDW: 14 % (ref 11.5–15.5)
WBC: 16.6 10*3/uL — ABNORMAL HIGH (ref 4.0–10.5)

## 2017-11-28 MED ORDER — LACTULOSE 10 GM/15ML PO SOLN
20.0000 g | Freq: Every day | ORAL | Status: DC
  Administered 2017-11-28 – 2017-11-30 (×3): 20 g via ORAL
  Filled 2017-11-28 (×3): qty 30

## 2017-11-28 MED ORDER — FLEET ENEMA 7-19 GM/118ML RE ENEM
1.0000 | ENEMA | Freq: Once | RECTAL | Status: AC
  Administered 2017-11-28: 1 via RECTAL
  Filled 2017-11-28: qty 1

## 2017-11-28 MED ORDER — SODIUM CHLORIDE 0.9 % IV SOLN
INTRAVENOUS | Status: DC
  Administered 2017-11-28: 17:00:00 via INTRAVENOUS

## 2017-11-28 MED ORDER — SENNOSIDES-DOCUSATE SODIUM 8.6-50 MG PO TABS
1.0000 | ORAL_TABLET | Freq: Two times a day (BID) | ORAL | Status: DC
  Administered 2017-11-28 – 2017-11-30 (×4): 1 via ORAL
  Filled 2017-11-28 (×5): qty 1

## 2017-11-28 NOTE — Progress Notes (Signed)
Pt ambulated about 940 ft in the hallway at night. Tolerated well. Pt has been complaining of gas pain/back pain. Maalox with prune juice given. Pt is sitting in chair with son at bedside. Call bell within reach. Will continue to monitor.  Judithann SheenJuhi Salaya Holtrop, RN

## 2017-11-28 NOTE — Care Management Note (Signed)
Case Management Note Donn PieriniKristi Princella Jaskiewicz RN, BSN Unit 4E-Case Manager [phone]  Patient Details  Name: Nathan LegacyMichael Hernandez MRN: [id number] Date of Birth: June 11, 1960  Subjective/Objective:   Pt admitted s/p TCAR                Action/Plan: PTA pt lived at home with family, on prior admission [facility], also was [facility] and has an appointment for July 10th. No further CM needs noted for transition home at this time- CM will follow post TCAR  Expected Discharge Date:  11/27/17               Expected Discharge Plan:  Home/Self Care  In-House Referral:  NA  Discharge planning Services  CM Consult  Post Acute Care Choice:    Choice offered to:  Patient  DME Arranged:    DME Agency:     HH Arranged:    HH Agency:     Status of Service:  In process, will continue to follow  If discussed at Long Length of Stay Meetings, dates discussed:    Discharge Disposition: home/self care   Additional Comments:  Darrold SpanWebster, Maansi Wike Hall, RN 11/28/2017, 11:17 AM

## 2017-11-28 NOTE — Progress Notes (Addendum)
  Progress Note    11/28/2017 7:14 AM 4 Days Post-Op  Subjective:  Only complaint is he is having trouble going to the bathroom. Says his abdomen is very uncomfortable.   Had a suppository this morning and had a small BM.  Walked yesterday and this morning.  Tm 99.4 afebrile last night at 1900 150's systolic HR 60's-90's NSR 93% RA  Vitals:   11/27/17 1952 11/28/17 0639  BP: (!) 152/84   Pulse: 76   Resp: 17 19  Temp: 98.7 F (37.1 C)   SpO2: 99%     Physical Exam: Cardiac:  regular Lungs:  Non labored Incisions:  Left groin and right neck incisions are clean and dry Extremities: 5/5 RUE/LLE 4/5LUE/LLE Abdomen:  Firm; +BS; +occ flatus; +small BM  CBC    Component Value Date/Time   WBC 12.7 (H) 11/27/2017 0321   RBC 3.08 (L) 11/27/2017 0321   HGB 8.9 (L) 11/27/2017 0321   HCT 26.9 (L) 11/27/2017 0321   PLT 144 (L) 11/27/2017 0321   MCV 87.3 11/27/2017 0321   MCH 28.9 11/27/2017 0321   MCHC 33.1 11/27/2017 0321   RDW 14.6 11/27/2017 0321   LYMPHSABS 2.2 11/20/2017 1143   MONOABS 1.0 11/20/2017 1143   EOSABS 0.3 11/20/2017 1143   BASOSABS 0.1 11/20/2017 1143    BMET    Component Value Date/Time   NA 139 11/25/2017 0354   K 4.0 11/25/2017 0354   CL 110 11/25/2017 0354   CO2 23 11/25/2017 0354   GLUCOSE 153 (H) 11/25/2017 0354   BUN 6 11/25/2017 0354   CREATININE 0.79 11/25/2017 0354   CREATININE 0.83 05/15/2014 0904   CALCIUM 7.9 (L) 11/25/2017 0354   GFRNONAA >60 11/25/2017 0354   GFRNONAA >89 05/15/2014 0904   GFRAA >60 11/25/2017 0354   GFRAA >89 05/15/2014 0904    INR    Component Value Date/Time   INR 1.00 11/20/2017 1143     Intake/Output Summary (Last 24 hours) at 11/28/2017 0714 Last data filed at 11/28/2017 0200 Gross per 24 hour  Intake 240 ml  Output 2575 ml  Net -2335 ml     Assessment:  57 y.o. male is s/p:  TCAR and  Left groin exploration and evacuation of left retroperitoneal hematoma; ligation of tributary branch of  external iliac artery 4 Days Post-Op  Plan: -pt doing well from surgery;  -still having abdominal discomfort-had a supp this am with little effect.  Will add lactulose to see if this will help him. -DVT prophylaxis:  No pharmacologic agents at this time due to high risk of bleeding; continue SCD's.  -continue Plavix -ambulate   Doreatha MassedSamantha Rhyne, PA-C [facility] [phone] 11/28/2017 7:14 AM  I agree with the above.  He is still having issues with constipation.  Awaiting lactulose.  Added Senna.  Encouraged ambulation.  Will keep again today.  Repeat CBC in am  WElls BRabham

## 2017-11-28 NOTE — Progress Notes (Signed)
Asked by RN to see pt.  She states he had the lactulose and enema with no relief.  He did have some BM, but abdomen still firm and distended.  He feels nauseated but no vomiting at this time.  He says he's not passing any flatus lately.  He does not have an appetite.  Son says he did get up and walk a couple times and has been up to the chair earlier today.  Doubt he is having further bleeding as his vital signs are stable but will order CBC to compare to this morning.  Also have ordered a stat KUB.  Will start normal saline at 50cc/hr since he is not eating/drinking.  Dr. Myra GianottiBrabham has been updated.  Doreatha MassedSamantha Terryn Redner, [facility] 11/28/2017 3:09 PM

## 2017-11-29 ENCOUNTER — Encounter: Payer: Self-pay | Admitting: *Deleted

## 2017-11-29 ENCOUNTER — Other Ambulatory Visit: Payer: Self-pay | Admitting: *Deleted

## 2017-11-29 LAB — CBC
HCT: 30 % — ABNORMAL LOW (ref 39.0–52.0)
Hemoglobin: 9.8 g/dL — ABNORMAL LOW (ref 13.0–17.0)
MCH: 28.5 pg (ref 26.0–34.0)
MCHC: 32.7 g/dL (ref 30.0–36.0)
MCV: 87.2 fL (ref 78.0–100.0)
Platelets: 307 10*3/uL (ref 150–400)
RBC: 3.44 MIL/uL — AB (ref 4.22–5.81)
RDW: 13.8 % (ref 11.5–15.5)
WBC: 17.2 10*3/uL — ABNORMAL HIGH (ref 4.0–10.5)

## 2017-11-29 MED ORDER — SODIUM CHLORIDE 0.9 % IV SOLN
INTRAVENOUS | Status: AC
  Administered 2017-11-29: 10:00:00 via INTRAVENOUS

## 2017-11-29 NOTE — Telephone Encounter (Signed)
This encounter was created in error - please disregard.

## 2017-11-29 NOTE — Patient Outreach (Signed)
[facility] Care Management  11/29/2017  Gerline LegacyMichael Capistran May 23, 1961 [id number]   Referral via RED Alert-EMMI-Stroke, Day# 1, 11/23/2017; Reason: Been able to take every dose of meds-No  Telephone call x2; left HIPPA compliant voice mail requesting return call.  Plan: Follow up in 2-4 days.  Colleen CanLinda Briasia Flinders, RN BSN CCM Care Management Coordinator [facility]  [phone]

## 2017-11-29 NOTE — Progress Notes (Addendum)
  Progress Note    11/29/2017 7:09 AM 5 Days Post-Op  Subjective:  Sleeping-awakes easily; says he is passing some gas and his nausea is better but still doesn't feel well.   Tm 99.6 now 99 HR 70's-100's NSR 130's systolic 99% RA  Vitals:   11/29/17 0018 11/29/17 0515  BP:  132/87  Pulse:  81  Resp: (!) 23 (!) 21  Temp:  99 F (37.2 C)  SpO2:  99%    Physical Exam: General:  No distress Lungs:  Non labored Abdomen:  Still with some distension but abdomen is softer this morning.  +BS; +flatus  CBC    Component Value Date/Time   WBC 17.2 (H) 11/29/2017 0449   RBC 3.44 (L) 11/29/2017 0449   HGB 9.8 (L) 11/29/2017 0449   HCT 30.0 (L) 11/29/2017 0449   PLT 307 11/29/2017 0449   MCV 87.2 11/29/2017 0449   MCH 28.5 11/29/2017 0449   MCHC 32.7 11/29/2017 0449   RDW 13.8 11/29/2017 0449   LYMPHSABS 1.3 11/28/2017 1501   MONOABS 3.0 (H) 11/28/2017 1501   EOSABS 0.1 11/28/2017 1501   BASOSABS 0.0 11/28/2017 1501    BMET    Component Value Date/Time   NA 139 11/25/2017 0354   K 4.0 11/25/2017 0354   CL 110 11/25/2017 0354   CO2 23 11/25/2017 0354   GLUCOSE 153 (H) 11/25/2017 0354   BUN 6 11/25/2017 0354   CREATININE 0.79 11/25/2017 0354   CREATININE 0.83 05/15/2014 0904   CALCIUM 7.9 (L) 11/25/2017 0354   GFRNONAA >60 11/25/2017 0354   GFRNONAA >89 05/15/2014 0904   GFRAA >60 11/25/2017 0354   GFRAA >89 05/15/2014 0904    INR    Component Value Date/Time   INR 1.00 11/20/2017 1143     Intake/Output Summary (Last 24 hours) at 11/29/2017 0709 Last data filed at 11/29/2017 0517 Gross per 24 hour  Intake 480 ml  Output 1625 ml  Net -1145 ml    KUB 11/28/17: IMPRESSION: Moderate amount of intestinal gas, possibly reflective of a degree of ileus.   Assessment:  57 y.o. male is s/p:  TCAR and  Left groin exploration and evacuation of left retroperitoneal hematoma; ligation of tributary branch of external iliac artery  5 Days Post-Op  Plan: -pt's  KUB with intestinal gas but no evidence of obstruction.  He is passing some flatus this am.  His abdomen still has some distension but is considerably softer this morning.  His nausea has resolved.  Will keep IVF for this morning. -acute blood loss anemia is stable -continue mobilization-hgb has been stable for th past 24 hours -DVT prophylaxis:  No pharmacologic prophylaxis due to bleeding.  Continue SCD's.    Doreatha MassedSamantha Rhyne, PA-C [facility] [phone] 11/29/2017 7:09 AM  I agree with the above.  I have seen and evaluated the patient.  He states that he is feeling a little better this evening.  He did have a bowel movement earlier today which was more formed.  He is passing flatus.  He feels his abdomen is less full.  On examination his incision is healing appropriately.  He is still distended.  He remains neurologically unchanged from his preoperative baseline.  The patient will continue with mobilization.  I would like for physical therapy to work with him regarding his stroke while he is in the hospital. Acute blood loss anemia: No further signs of bleeding.  Hematocrit is increasing appropriately.  Durene CalWells Lois Ostrom

## 2017-11-30 ENCOUNTER — Telehealth: Payer: Self-pay | Admitting: Surgery

## 2017-11-30 MED FILL — CLOPIDOGREL 75 MG TABLET: 75 | 30 days supply | Qty: 30 | Fill #0

## 2017-11-30 MED FILL — ATORVASTATIN 80 MG TABLET: 80 | 30 days supply | Qty: 30 | Fill #0

## 2017-11-30 NOTE — Progress Notes (Signed)
D/c instructions and prescriptions given to pt. Wound care reviewed. Iv removed, clean and intact. Telemetry removed, Son to escort pt home.  Versie StarksHanna  Cadden Elizondo, RN

## 2017-11-30 NOTE — Progress Notes (Signed)
Pt walked in hallway 470 ft, and tolerated well. Returned to [employer]. Will continue to monitor.  Versie StarksHanna  Dylan Ruotolo, RN

## 2017-11-30 NOTE — Telephone Encounter (Signed)
resch appt lvm 12/15/17 2pm carotid 12/18/17 330pm p/o MD

## 2017-11-30 NOTE — Progress Notes (Signed)
    Subjective  -   Feels better today Passing faltus Stool x 1 yesterday   Physical Exam:  Abdomen full but less distended Left groin and neck incision ok       Assessment/Plan:    Continues to feel better.  Wants to go home today.  Will plan d/c after lunch  Nathan Hernandez 11/30/2017 9:48 AM --  Vitals:   11/29/17 2023 11/30/17 0445  BP: 122/72 111/63  Pulse: 70 70  Resp: 16 16  Temp: 98.6 F (37 C) 98.9 F (37.2 C)  SpO2: 97% 99%    Intake/Output Summary (Last 24 hours) at 11/30/2017 0948 Last data filed at 11/30/2017 0446 Gross per 24 hour  Intake 599.9 ml  Output 900 ml  Net -300.1 ml     Laboratory CBC    Component Value Date/Time   WBC 17.2 (H) 11/29/2017 0449   HGB 9.8 (L) 11/29/2017 0449   HCT 30.0 (L) 11/29/2017 0449   PLT 307 11/29/2017 0449    BMET    Component Value Date/Time   NA 139 11/25/2017 0354   K 4.0 11/25/2017 0354   CL 110 11/25/2017 0354   CO2 23 11/25/2017 0354   GLUCOSE 153 (H) 11/25/2017 0354   BUN 6 11/25/2017 0354   CREATININE 0.79 11/25/2017 0354   CREATININE 0.83 05/15/2014 0904   CALCIUM 7.9 (L) 11/25/2017 0354   GFRNONAA >60 11/25/2017 0354   GFRNONAA >89 05/15/2014 0904   GFRAA >60 11/25/2017 0354   GFRAA >89 05/15/2014 0904    COAG Lab Results  Component Value Date   INR 1.00 11/20/2017   No results found for: PTT  Antibiotics Anti-infectives (From admission, onward)   Start     Dose/Rate Route Frequency Ordered Stop   11/24/17 1930  ceFAZolin (ANCEF) IVPB 2g/100 mL premix     2 g 200 mL/hr over 30 Minutes Intravenous Every 8 hours 11/24/17 1531 11/25/17 1129   11/24/17 1000  ceFAZolin (ANCEF) IVPB 2g/100 mL premix     2 g 200 mL/hr over 30 Minutes Intravenous 30 min pre-op 11/24/17 0808 11/24/17 1127   11/24/17 0809  ceFAZolin (ANCEF) IVPB 2g/100 mL premix  Status:  Discontinued     2 g 200 mL/hr over 30 Minutes Intravenous 30 min pre-op 11/24/17 0809 11/24/17 1442   11/24/17 0755  ceFAZolin  (ANCEF) 2-4 GM/100ML-% IVPB    Note to Pharmacy:  Ray ChurchBowman, Jennifer   : cabinet override      11/24/17 0755 11/24/17 1112       V. Charlena CrossWells Penni Penado IV, M.D. [facility] [place] Office: [phone] Pager:  [phone]

## 2017-11-30 NOTE — Care Management Note (Addendum)
Case Management Note Donn PieriniKristi Bayan Hedstrom RN, BSN Unit 4E-Case Manager [phone]  Patient Details  Name: Nathan LegacyMichael Humphrey MRN: [id number] Date of Birth: 12/28/1960  Subjective/Objective:   Pt admitted s/p TCAR                Action/Plan: PTA pt lived at home with family, on prior admission pt was [facility] for outpt therapy, also was [facility] and has an appointment for July 10th. No further CM needs noted for transition home at this time- CM will follow post TCAR  Expected Discharge Date:  11/30/17               Expected Discharge Plan:  Home/Self Care  In-House Referral:  NA  Discharge planning Services  CM Consult  Post Acute Care Choice:    Choice offered to:  Patient  DME Arranged:    DME Agency:     HH Arranged:    HH Agency:     Status of Service:  Completed, signed off  If discussed at [employer] Length of Stay Meetings, dates discussed:    Discharge Disposition: home/self care   Additional Comments:  11/30/17- 1010- Cariann Kinnamon RN, CM- pt for discharge today- no further CM needs noted for transition home.  Update 1400- post PT eval recommendation for RW- order placed for DME- CM notified [facility] need RW to be delivered to room prior to discharge  Darrold SpanWebster, Laquinn Shippy Hall, RN 11/30/2017, 10:13 AM

## 2017-11-30 NOTE — Evaluation (Signed)
Physical Therapy Evaluation Patient Details Name: Nathan Hernandez MRN: [id number] DOB: 01-01-1961 Today's Date: 11/30/2017   History of Present Illness  Patient is a 36 male admitted secondary to L sided numbness and weakness. 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 Pt is s/p trans carotid artery revascularization (TCAR), Right. PMH including but not limited to HLD, HTN, and polysubstance abuse.    Clinical Impression  Pt presented supine in bed with HOB elevated, awake and willing to participate in therapy session. Prior to admission, pt reported that he ambulated with use of SPC and was independent with ADLs. Pt lives with his mother and has family to assist PRN. Pt currently able to perform bed mobility at modified independence, transfers with supervision and ambulated in hallway with RW and min guard. All VSS throughout. Pt with residual L sided deficits that should be further assessed and addressed in OP PT. No further acute PT needs identified at this time. PT signing off.     Follow Up Recommendations Outpatient PT;Supervision - Intermittent    Equipment Recommendations  Rolling walker with 5" wheels    Recommendations for Other Services       Precautions / Restrictions Precautions Precautions: Fall Restrictions Weight Bearing Restrictions: No      Mobility  Bed Mobility Overal bed mobility: Modified Independent                Transfers Overall transfer level: Needs assistance Equipment used: Rolling walker (2 wheeled) Transfers: Sit to/from Stand Sit to Stand: Supervision         General transfer comment: for safety  Ambulation/Gait Ambulation/Gait assistance: Min guard Gait Distance (Feet): 200 Feet Assistive device: Rolling walker (2 wheeled) Gait Pattern/deviations: Step-through pattern;Decreased stride length Gait  velocity: WFL Gait velocity interpretation: >2.62 ft/sec, indicative of community ambulatory General Gait Details: pt steady with RW with good reciprocal gait pattern. No LOB or need for physical assistance  Stairs            Wheelchair Mobility    Modified Rankin (Stroke Patients Only)       Balance Overall balance assessment: Needs assistance Sitting-balance support: No upper extremity supported;Feet supported Sitting balance-Leahy Scale: Good     Standing balance support: During functional activity;Bilateral upper extremity supported Standing balance-Leahy Scale: Poor               High level balance activites: Direction changes;Turns;Sudden stops High Level Balance Comments: pt steady with RW with min guard for safety             Pertinent Vitals/Pain Pain Assessment: No/denies pain    Home Living Family/patient expects to be discharged to:: Private residence Living Arrangements: Parent Available Help at Discharge: Family;Available PRN/intermittently Type of Home: House Home Access: Stairs to enter;Ramped entrance   Entrance Stairs-Number of Steps: 5 Home Layout: One level Home Equipment: Cane - single point      Prior Function Level of Independence: Independent with assistive device(s)         Comments: pt ambulates with SPC     Hand Dominance   Dominant Hand: Right    Extremity/Trunk Assessment   Upper Extremity Assessment Upper Extremity Assessment: LUE deficits/detail LUE Deficits / Details: pt with slower movement of L UE as compared to R; AROM WFL but slower speed and coordination. MMT revealed 3/5 for shoulder abduction, 3/5 for shoulder flexion, 3/5 for elbow flexion,  4/5 for elbow extension, 4/5 for forearm pronation and 3/5 for forearm supination. Pt with fair grip strength but decreased as compared to R. Pt also with reported numbness and tingling in L hand LUE Coordination: decreased fine motor;decreased gross motor    Lower  Extremity Assessment Lower Extremity Assessment: LLE deficits/detail LLE Deficits / Details: MMT revealed 3/5 for hip flexion, 5/5 for knee extension, 5/5 for knee flexion, 4/5 for hip abduction, 4/5 for hip adduction, 5/5 for ankle DF. LLE Coordination: decreased fine motor       Communication   Communication: No difficulties  Cognition Arousal/Alertness: Awake/alert Behavior During Therapy: WFL for tasks assessed/performed Overall Cognitive Status: Within Functional Limits for tasks assessed                                        General Comments      Exercises     Assessment/Plan    PT Assessment All further PT needs can be met in the next venue of care  PT Problem List Decreased strength;Decreased range of motion;Decreased activity tolerance;Decreased balance;Decreased mobility;Decreased coordination;Decreased knowledge of use of DME;Decreased safety awareness;Decreased knowledge of precautions       PT Treatment Interventions      PT Goals (Current goals can be found in the Care Plan section)  Acute Rehab PT Goals Patient Stated Goal: return home today    Frequency     Barriers to discharge        Co-evaluation               AM-PAC PT "6 Clicks" Daily Activity  Outcome Measure Difficulty turning over in bed (including adjusting bedclothes, sheets and blankets)?: None Difficulty moving from lying on back to sitting on the side of the bed? : None Difficulty sitting down on and standing up from a chair with arms (e.g., wheelchair, bedside commode, etc,.)?: Unable Help needed moving to and from a bed to chair (including a wheelchair)?: None Help needed walking in hospital room?: A Little Help needed climbing 3-5 steps with a railing? : A Little 6 Click Score: 19    End of Session Equipment Utilized During Treatment: Gait belt Activity Tolerance: Patient tolerated treatment well Patient left: in bed;with call bell/phone within reach;with  family/visitor present;Other (comment)(sitting EOB) Nurse Communication: Mobility status PT Visit Diagnosis: Other abnormalities of gait and mobility (R26.89)    Time: 6435-3912 PT Time Calculation (min) (ACUTE ONLY): 19 min   Charges:   PT Evaluation $PT Eval Moderate Complexity: 1 Mod     PT G Codes:        [place], PT, DPT [place] 11/30/2017, 2:15 PM

## 2017-12-01 ENCOUNTER — Other Ambulatory Visit: Payer: Self-pay

## 2017-12-05 ENCOUNTER — Other Ambulatory Visit: Payer: Self-pay | Admitting: *Deleted

## 2017-12-05 NOTE — Patient Outreach (Signed)
[facility] Care Management  12/05/2017  Gerline LegacyMichael Steinmeyer [birth year] [id number]   Referral via RED Alert-EMMI-Stroke, Day# 1, 11/23/2017; Reason: Been able to take every dose of meds-No  Telephone call #3; patient was advised of reason for call. Hippa verification received.   Patient voices that he is taking medications as prescribed by his doctors. States all prescriptions were filled upon discharge from hospital.  States all of follow up appointments has been scheduled for July dates.  States he has transportation.  EMMI call addressed. No further concerns.  Plan: Case closure.  Colleen CanLinda Marquavius Scaife, RN BSN CCM Care Management Coordinator [facility]  [phone]

## 2017-12-06 ENCOUNTER — Other Ambulatory Visit: Payer: Self-pay | Admitting: *Deleted

## 2017-12-06 NOTE — Patient Outreach (Signed)
[facility] Care Management  12/06/2017  Nathan Hernandez [birth year] [id number]  Referral via EMMI-Red Alert, 12/05/2017  This has already been addressed. Spoke with patient 12/05/2017 and he voiced that follow up appointments had been scheduled.   Plan: Case closure.  Nathan CanLinda Inice Sanluis, RN BSN CCM Care Management Coordinator [facility] Care [facility]  [phone]

## 2017-12-13 ENCOUNTER — Ambulatory Visit (INDEPENDENT_AMBULATORY_CARE_PROVIDER_SITE_OTHER): Payer: Self-pay | Admitting: Physician Assistant

## 2017-12-15 ENCOUNTER — Ambulatory Visit (HOSPITAL_COMMUNITY)
Admit: 2017-12-15 | Discharge: 2017-12-15 | Disposition: A | Discharge status: Home / Self Care | Payer: Medicaid Other | Attending: Vascular Surgery | Admitting: Vascular Surgery

## 2017-12-15 DIAGNOSIS — I6529 Occlusion and stenosis of unspecified carotid artery: Secondary | ICD-10-CM | POA: Diagnosis present

## 2017-12-15 DIAGNOSIS — Z48812 Encounter for surgical aftercare following surgery on the circulatory system: Secondary | ICD-10-CM

## 2017-12-15 DIAGNOSIS — I6522 Occlusion and stenosis of left carotid artery: Secondary | ICD-10-CM | POA: Diagnosis not present

## 2017-12-15 DIAGNOSIS — F17200 Nicotine dependence, unspecified, uncomplicated: Secondary | ICD-10-CM | POA: Insufficient documentation

## 2017-12-15 DIAGNOSIS — I10 Essential (primary) hypertension: Secondary | ICD-10-CM | POA: Diagnosis not present

## 2017-12-15 DIAGNOSIS — E785 Hyperlipidemia, unspecified: Secondary | ICD-10-CM | POA: Insufficient documentation

## 2017-12-18 ENCOUNTER — Encounter: Payer: Self-pay | Admitting: Surgery

## 2017-12-21 ENCOUNTER — Encounter (HOSPITAL_COMMUNITY): Payer: Self-pay

## 2017-12-21 ENCOUNTER — Encounter: Payer: Self-pay | Admitting: Vascular Surgery

## 2018-01-08 ENCOUNTER — Ambulatory Visit (INDEPENDENT_AMBULATORY_CARE_PROVIDER_SITE_OTHER): Payer: Self-pay | Admitting: Surgery

## 2018-01-08 ENCOUNTER — Encounter: Payer: Self-pay | Admitting: Surgery

## 2018-01-08 ENCOUNTER — Encounter (INDEPENDENT_AMBULATORY_CARE_PROVIDER_SITE_OTHER): Payer: Self-pay | Admitting: Physician Assistant

## 2018-01-08 ENCOUNTER — Encounter

## 2018-01-08 ENCOUNTER — Other Ambulatory Visit: Payer: Self-pay

## 2018-01-08 ENCOUNTER — Ambulatory Visit (INDEPENDENT_AMBULATORY_CARE_PROVIDER_SITE_OTHER): Payer: Self-pay | Admitting: Physician Assistant

## 2018-01-08 VITALS — BP 111/73 | HR 67 | Temp 98.1°F | Resp 16 | Ht 72.0 in | Wt 157.0 lb

## 2018-01-08 VITALS — BP 116/73 | HR 61 | Temp 98.0°F | Ht 72.0 in | Wt 157.0 lb

## 2018-01-08 DIAGNOSIS — E785 Hyperlipidemia, unspecified: Secondary | ICD-10-CM

## 2018-01-08 DIAGNOSIS — I6521 Occlusion and stenosis of right carotid artery: Secondary | ICD-10-CM

## 2018-01-08 DIAGNOSIS — Z1159 Encounter for screening for other viral diseases: Secondary | ICD-10-CM | POA: Diagnosis not present

## 2018-01-08 DIAGNOSIS — Z09 Encounter for follow-up examination after completed treatment for conditions other than malignant neoplasm: Secondary | ICD-10-CM | POA: Diagnosis not present

## 2018-01-08 DIAGNOSIS — F17200 Nicotine dependence, unspecified, uncomplicated: Secondary | ICD-10-CM

## 2018-01-08 DIAGNOSIS — I639 Cerebral infarction, unspecified: Secondary | ICD-10-CM

## 2018-01-08 MED ORDER — CLOPIDOGREL BISULFATE 75 MG PO TABS: 75.0000 mg | ORAL_TABLET | Freq: Every day | ORAL | 5 refills | Status: DC

## 2018-01-08 MED ORDER — ATORVASTATIN CALCIUM 80 MG PO TABS: 80.0000 mg | ORAL_TABLET | Freq: Every day | ORAL | 5 refills | Status: DC

## 2018-01-08 MED ORDER — ASPIRIN 325 MG PO TABS: 325.0000 mg | ORAL_TABLET | Freq: Every day | ORAL | 5 refills | Status: AC

## 2018-01-08 MED ORDER — NICOTINE 21 MG/24HR TD PT24: 21.0000 mg | MEDICATED_PATCH | Freq: Every day | TRANSDERMAL | 2 refills | Status: DC

## 2018-01-08 NOTE — Progress Notes (Signed)
Subjective:  Patient ID: Nathan Hernandez, male    DOB: [birth year]  Age: 57 y.o. MRN: [id number]  CC: hospital f/u  HPI Nathan Hernandez is a 57 y.o. male with a medical history of HTN, HLD, CVA, polysubstance abuse, OA, and stenosis of right carotid artery s/p transcarotid artery revascularization presents as a new patient on hospital discharge f/u. Went to ED on 11/20/17 with left sided weakness. MRI revealed multiple CVAs and admitted. ETOH positive at ED. Discharged on 11/22/17 with plavix and aspirin. Underwent transcarotid artery revascularization of the right carotid artery. Went to vascular this morning on f/u and was told the carotid US was "good". He is set to f/u again with vascular in six months. Pt states he has some residual weakness of the LUE and some tingling of the LLE with prolonged standing or sitting. Has not seen a neurologist yet. He is working on Education officer, community approved. Has not applied for [employer]. Using Nicotine patches and has not smoked since. Has drastically cut down on alcohol and is drinking an occasional "light beer". Does not endorse any other symptoms or complaints.       Outpatient Medications Prior to Visit  Medication Sig Dispense Refill  . aspirin 325 MG tablet Take 1 tablet (325 mg total) by mouth daily. 30 tablet 0  . atorvastatin (LIPITOR) 80 MG tablet Take 1 tablet (80 mg total) by mouth daily at 6 PM. 30 tablet 3  . clopidogrel (PLAVIX) 75 MG tablet Take 1 tablet (75 mg total) by mouth daily. 90 tablet 1  . nicotine (NICODERM CQ - DOSED IN MG/24 HOURS) 21 mg/24hr patch Place 1 patch (21 mg total) onto the skin daily. 28 patch 0  . NONFORMULARY OR COMPOUNDED ITEM Please evaluate and treat for outpatient physical therapy.  Diagnosis-CVA 1 each 0   No facility-administered medications prior to visit.      ROS Review of Systems  Constitutional: Negative for chills, fever and malaise/fatigue.  Eyes: Negative for  blurred vision.  Respiratory: Negative for shortness of breath.   Cardiovascular: Negative for chest pain and palpitations.  Gastrointestinal: Negative for abdominal pain and nausea.  Genitourinary: Negative for dysuria and hematuria.  Musculoskeletal: Negative for joint pain and myalgias.  Skin: Negative for rash.  Neurological: Positive for tingling and weakness. Negative for headaches.  Psychiatric/Behavioral: Negative for depression. The patient is not nervous/anxious.     Objective:  BP 116/73 (BP Location: Left Arm, Patient Position: Sitting, Cuff Size: Normal)   Pulse 61   Temp 98 F (36.7 C) (Oral)   Ht 6' (1.829 m)   Wt 157 lb (71.2 kg)   SpO2 97%   BMI 21.29 kg/m   BP/Weight 01/08/2018 01/08/2018 11/30/2017  Systolic BP 111 116 111  Diastolic BP 73 73 63  Wt. (Lbs) 157 157 -  BMI 21.29 21.29 -      Physical Exam  Constitutional: He is oriented to person, place, and time.  Well developed, well nourished, NAD, polite  HENT:  Head: Normocephalic and atraumatic.  Eyes: No scleral icterus.  Neck: Normal range of motion. Neck supple. No thyromegaly present.  Cardiovascular: Normal rate, regular rhythm and normal heart sounds. Exam reveals no gallop and no friction rub.  No murmur heard. No carotid bruit bilaterally. No LE edema bilaterally.   Pulmonary/Chest: Effort normal and breath sounds normal.  Musculoskeletal: Normal range of motion. He exhibits no edema or deformity.  Neurological: He is alert and oriented to person, place, and  time.  Strength 4/5 of the LUE. Altered light touch sensation of the LLE.   Skin: Skin is warm and dry. No rash noted. No erythema. No pallor.  Psychiatric: He has a normal mood and affect. His behavior is normal. Thought content normal.  Vitals reviewed.    Assessment & Plan:    1. Hospital discharge follow-up - CBC with Differential - Basic Metabolic Panel  2. HLD (hyperlipidemia) - atorvastatin (LIPITOR) 80 MG tablet; Take 1  tablet (80 mg total) by mouth daily at 6 PM.  Dispense: 30 tablet; Refill: 5  3. Cerebrovascular accident (CVA), unspecified mechanism (HCC) - clopidogrel (PLAVIX) 75 MG tablet; Take 1 tablet (75 mg total) by mouth daily.  Dispense: 30 tablet; Refill: 5 - aspirin 325 MG tablet; Take 1 tablet (325 mg total) by mouth daily.  Dispense: 30 tablet; Refill: 5  4. Tobacco use disorder - nicotine (NICODERM CQ - DOSED IN MG/24 HOURS) 21 mg/24hr patch; Place 1 patch (21 mg total) onto the skin daily.  Dispense: 28 patch; Refill: 2  5. Need for hepatitis C screening test - Hepatitis c antibody (reflex)   Meds ordered this encounter  Medications  . clopidogrel (PLAVIX) 75 MG tablet    Sig: Take 1 tablet (75 mg total) by mouth daily.    Dispense:  30 tablet    Refill:  5    Order Specific Question:   Supervising Provider    Answer:   Hoy RegisterNEWLIN, ENOBONG [[id number]]  . atorvastatin (LIPITOR) 80 MG tablet    Sig: Take 1 tablet (80 mg total) by mouth daily at 6 PM.    Dispense:  30 tablet    Refill:  5    Order Specific Question:   Supervising Provider    Answer:   Hoy RegisterNEWLIN, ENOBONG [[id number]]  . aspirin 325 MG tablet    Sig: Take 1 tablet (325 mg total) by mouth daily.    Dispense:  30 tablet    Refill:  5    Order Specific Question:   Supervising Provider    Answer:   Hoy RegisterNEWLIN, ENOBONG [[id number]]  . nicotine (NICODERM CQ - DOSED IN MG/24 HOURS) 21 mg/24hr patch    Sig: Place 1 patch (21 mg total) onto the skin daily.    Dispense:  28 patch    Refill:  2    Order Specific Question:   Supervising Provider    Answer:   Hoy RegisterNEWLIN, ENOBONG [[id number]]    Follow-up: Return in about 3 months (around 04/10/2018) for annual physical .   Loletta Specteroger David Ysabel Stankovich PA

## 2018-01-08 NOTE — Progress Notes (Signed)
Patient name: Nathan Hernandez MRN: [id number] DOB: [birth year] Sex: male  REASON FOR VISIT:     post op  HISTORY OF PRESENT ILLNESS:   Nathan LegacyMichael Vital is a 57 y.o. male who presented to the hospital in June 2019 with a high-grade right internal carotid artery stenosis as well as right watershed infarcts.  He has loss of fine motor function and weakness in the left arm.  On 11/24/2017, he underwent TCAR.  Intraoperative findings included a 95% internal carotid artery stenosis that decreased to less than 10% after stenting.  That evening, he developed pain in his left retroperitoneum.  A CT scan showed retroperitoneal hematoma and therefore he was taken to the operating room by Dr. Arbie CookeyEarly for evacuation of hematoma.  It was found that the venous cannulation was through a tributary branch of the external iliac artery which was ligated.  The patient ultimately recovered from this and was able to be discharged home.  He is still having issues with his left hand but continues to do his exercises.  He also states that he has some numbness in his left groin incision.  There was initially some edema in the left leg but that is gotten better.  CURRENT MEDICATIONS:    Current Outpatient Medications  Medication Sig Dispense Refill  . aspirin 325 MG tablet Take 1 tablet (325 mg total) by mouth daily. 30 tablet 0  . atorvastatin (LIPITOR) 80 MG tablet Take 1 tablet (80 mg total) by mouth daily at 6 PM. 30 tablet 3  . clopidogrel (PLAVIX) 75 MG tablet Take 1 tablet (75 mg total) by mouth daily. 90 tablet 1  . nicotine (NICODERM CQ - DOSED IN MG/24 HOURS) 21 mg/24hr patch Place 1 patch (21 mg total) onto the skin daily. 28 patch 0  . NONFORMULARY OR COMPOUNDED ITEM Please evaluate and treat for outpatient physical therapy.  Diagnosis-CVA 1 each 0   No current facility-administered medications for this visit.     REVIEW OF SYSTEMS:   [X]  denotes positive finding, [ ]   denotes negative finding Cardiac  Comments:  Chest pain or chest pressure:    Shortness of breath upon exertion:    Short of breath when lying flat:    Irregular heart rhythm:    Constitutional    Fever or chills:      PHYSICAL EXAM:   Vitals:   01/08/18 1303 01/08/18 1305  BP: 127/82 111/73  Pulse: 67   Resp: 16   Temp: 98.1 F (36.7 C)   TempSrc: Oral   SpO2: 97%   Weight: 157 lb (71.2 kg)   Height: 6' (1.829 m)     GENERAL: The patient is a well-nourished male, in no acute distress. The vital signs are documented above. CARDIOVASCULAR: There is a regular rate and rhythm. PULMONARY: Non-labored respirations Left carotid of left femoral incision has healed nicely  STUDIES:   Right Carotid: Patent right carotid stent with no evidence of restenosis.  Left Carotid: Velocities in the left ICA are consistent with a 1-39% stenosis.  Vertebrals: Bilateral vertebral arteries demonstrate antegrade flow. Subclavians: Normal flow hemodynamics were seen in bilateral subclavian       arteries.   MEDICAL ISSUES:   Follow-up 6 months with carotid duplex.  I would like him to be on Plavix for at least 3 months.  We can discuss stopping the Plavix when he returns  Durene CalWells Wille Aubuchon, MD [facility] Tel [phone] Pager [phone]

## 2018-01-08 NOTE — Patient Instructions (Signed)

## 2018-01-09 ENCOUNTER — Telehealth (INDEPENDENT_AMBULATORY_CARE_PROVIDER_SITE_OTHER): Payer: Self-pay

## 2018-01-09 LAB — CBC WITH DIFFERENTIAL/PLATELET
BASOS: 0 %
Basophils Absolute: 0 10*3/uL (ref 0.0–0.2)
EOS (ABSOLUTE): 0.3 10*3/uL (ref 0.0–0.4)
EOS: 3 %
HEMATOCRIT: 36.2 % — AB (ref 37.5–51.0)
Hemoglobin: 11.3 g/dL — ABNORMAL LOW (ref 13.0–17.7)
IMMATURE GRANS (ABS): 0 10*3/uL (ref 0.0–0.1)
IMMATURE GRANULOCYTES: 0 %
LYMPHS: 30 %
Lymphocytes Absolute: 2.6 10*3/uL (ref 0.7–3.1)
MCH: 27.1 pg (ref 26.6–33.0)
MCHC: 31.2 g/dL — ABNORMAL LOW (ref 31.5–35.7)
MCV: 87 fL (ref 79–97)
MONOCYTES: 13 %
Monocytes Absolute: 1.2 10*3/uL — ABNORMAL HIGH (ref 0.1–0.9)
Neutrophils Absolute: 4.7 10*3/uL (ref 1.4–7.0)
Neutrophils: 54 %
PLATELETS: 426 10*3/uL (ref 150–450)
RBC: 4.17 x10E6/uL (ref 4.14–5.80)
RDW: 15.7 % — ABNORMAL HIGH (ref 12.3–15.4)
WBC: 8.8 10*3/uL (ref 3.4–10.8)

## 2018-01-09 LAB — BASIC METABOLIC PANEL
BUN/Creatinine Ratio: 10 (ref 9–20)
BUN: 8 mg/dL (ref 6–24)
CALCIUM: 9.6 mg/dL (ref 8.7–10.2)
CHLORIDE: 102 mmol/L (ref 96–106)
CO2: 23 mmol/L (ref 20–29)
Creatinine, Ser: 0.77 mg/dL (ref 0.76–1.27)
GFR calc non Af Amer: 101 mL/min/{1.73_m2} (ref >59)
GFR, EST AFRICAN AMERICAN: 117 mL/min/{1.73_m2} (ref >59)
Glucose: 84 mg/dL (ref 65–99)
Potassium: 4.1 mmol/L (ref 3.5–5.2)
Sodium: 140 mmol/L (ref 134–144)

## 2018-01-09 LAB — HCV COMMENT:

## 2018-01-09 LAB — HEPATITIS C ANTIBODY (REFLEX): HCV Ab: <0.1 s/co ratio (ref 0.0–0.9)

## 2018-01-09 NOTE — Telephone Encounter (Signed)
-----   Message from Loletta Specteroger David Gomez, PA-C sent at 01/09/2018  8:34 AM EDT ----- Anemia is improving but still not at normal levels. HCV negative. Kidney filtration normal.

## 2018-01-09 NOTE — Telephone Encounter (Signed)
Patient aware that anemia is improving but still not at normal levels, negative HCV and normal kidney filtration. Maryjean Mornempestt S Roberts, CMA

## 2018-01-22 ENCOUNTER — Other Ambulatory Visit: Payer: Self-pay

## 2018-01-22 DIAGNOSIS — I6529 Occlusion and stenosis of unspecified carotid artery: Secondary | ICD-10-CM

## 2018-02-09 MED FILL — CLOPIDOGREL 75 MG TABLET: 75 | 30 days supply | Qty: 30 | Fill #0

## 2018-02-09 MED FILL — ATORVASTATIN 80 MG TABLET: 80 | 30 days supply | Qty: 30 | Fill #0

## 2018-02-21 MED FILL — NICOTINE 21 MG/24HR PATCH: 21 | 28 days supply | Qty: 28 | Fill #0

## 2018-03-12 MED FILL — ATORVASTATIN 80 MG TABLET: 80 | 30 days supply | Qty: 30 | Fill #1

## 2018-03-12 MED FILL — CLOPIDOGREL 75 MG TABLET: 75 | 30 days supply | Qty: 30 | Fill #1

## 2018-04-04 ENCOUNTER — Encounter: Payer: Self-pay | Admitting: Adult Health

## 2018-04-04 ENCOUNTER — Ambulatory Visit: Payer: Medicaid Other | Admitting: Adult Health

## 2018-04-04 VITALS — BP 118/72 | HR 69 | Ht 72.0 in | Wt 174.6 lb

## 2018-04-04 DIAGNOSIS — E785 Hyperlipidemia, unspecified: Secondary | ICD-10-CM

## 2018-04-04 DIAGNOSIS — I10 Essential (primary) hypertension: Secondary | ICD-10-CM | POA: Diagnosis not present

## 2018-04-04 DIAGNOSIS — I6521 Occlusion and stenosis of right carotid artery: Secondary | ICD-10-CM | POA: Diagnosis not present

## 2018-04-04 DIAGNOSIS — I6389 Other cerebral infarction: Secondary | ICD-10-CM | POA: Diagnosis not present

## 2018-04-04 MED ORDER — BACLOFEN 5 MG PO TABS: 5.0000 mg | ORAL_TABLET | Freq: Three times a day (TID) | ORAL | 3 refills | Status: DC

## 2018-04-04 MED FILL — BACLOFEN 5 MG TABS: 5 | 30 days supply | Qty: 90 | Fill #0

## 2018-04-04 NOTE — Progress Notes (Signed)
[facility] [address] [place]. [place] [address] [phone]       OFFICE FOLLOW UP NOTE  [name] Date of Birth:  1960-06-18 Medical Record Number:  [id number]   Reason for Referral:  hospital stroke follow up  CHIEF COMPLAINT:  Chief Complaint  Patient presents with  . 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 History obtained from patient and chart review. Reviewed all radiology images and labs personally.  [name] is a 57 y.o. male with history of HLD, marijuana use, 2-3 beers/day who presented with L HP and progressive L sided numbness and tingling x 6 months.  Per notes, patient has been noticing this for the past 6 months but with sudden changes recently prior to admission.  His family was not aware of the symptoms and once it became aware, convince him to go to the hospital.  He typically does not go to doctors or take any prescription medications.  CT head reviewed and showed likely right brain infarct with signs of loss of gray-white differentiation and chronic bilateral BG with balance.  MRI brain reviewed and showed multiple small right cerebral watershed infarcts in various ages, chronic bilateral BG and pontine infarcts.  MRA head showed a normal distal right ICA with narrowing, diffuse cervical spondylosis with neuroforaminal stenosis and mild spinal stenosis C3-4.  CTA head and neck showed severe near occlusion right ICA bifurcation 1 cm in length with decreased flow of right ACA and right MCA along with carotid siphon right 75% and left 50%, BA 50% and left VA 50%.  2D echo showed an EF of 60 to 65%.  Etiology of infarct secondary to large vessel disease source with right ICA near  occlusion.  No antithrombotic prior to admission and recommended DAPT with aspirin 325 mg and Plavix due to ICA stenosis.  LDL 89 and recommended increase of atorvastatin to 80 mg daily.  HTN stable during admission and recommended SBP 120-150.  Patient was discharged home in stable condition with recommendations of home health PT/OT with carotid enterectomy scheduled for 11/24/2017. Patient underwent right trans-carotid artery revascularization on 11/24/2017 which showed 95% right internal carotid artery stenosis stented to residual less than 10% stenosis and tolerated procedure well.  Later that evening after procedure, patient was complaining of LLQ abdominal pain 10/10 and was found to be hypotensive with SBP 80 and tachycardic.  CT was obtained with concern for retroperitoneal hematoma with compression of bladder immobilization of the left kidney.  Patient was taken back to the OR and underwent left groin exploration and evacuation of left retroperitoneal hematoma and ligation of tributary branch of the EIA.  Patient stable through rest of admission and was discharged home in stable condition. Patient is being seen today for hospital follow-up.  He does continue to have left-sided weakness with slight improvement.  He was unable to obtain therapies after discharge due to lack of insurance but has recently obtained Medicaid approval and plans on scheduling appointments with PT/OT.  He does complain of left upper extremity pain along with a sensation of tightness and swelling which has been limiting his movement and activity.  He currently is wearing a left wrist brace as he feels as  though flexing his wrist is making the pain worse.  He also has recently been wearing a arm sling to immobilize his left arm is also helps to provide relief from the pain.  He is currently using a cane but is able to ambulate without any recent falls.  He continues aspirin and Plavix without side effects of bleeding or bruising.  He  does have follow-up appointment with vascular surgery in 07/2018 for follow-up appointment and repeat carotid ultrasound.  It was recommended to continue on DAPT until that time.  Continues to take atorvastatin 80 mg without side effects myalgias.  Blood pressure today satisfactory 118/72.  No further concerns at this time.  Denies new or worsening stroke/TIA symptoms.   ROS:   14 system review of systems performed and negative with exception of joint pain, joint swelling, aching muscles and weakness  PMH:  Past Medical History:  Diagnosis Date  . HLD (hyperlipidemia)   . Hypertension   . Osteoarthritis   . Polysubstance abuse (HCC)   . Stroke [facility])     PSH:  Past Surgical History:  Procedure Laterality Date  . GROIN DEBRIDEMENT Left 11/24/2017   Procedure: EVACUATION OF LEFT GROIN HEMATOMA, REPAIR OF LEFT FEMORAL ARTERY BRACH;  Surgeon: Larina Earthly, MD;  Location: [facility] OR;  Service: Vascular;  Laterality: Left;  . KNEE SURGERY    . TRANSCAROTID ARTERY REVASCULARIZATION (TCAR)  11/24/2017  . TRANSCAROTID ARTERY REVASCULARIZATION Right 11/24/2017   Procedure: TRANSCAROTID ARTERY REVASCULARIZATION;  Surgeon: Nada Libman, MD;  Location: [facility] OR;  Service: Vascular;  Laterality: Right;    Social History:  Social History   Socioeconomic History  . Marital status: Divorced    Spouse name: Not on file  . Number of children: Not on file  . Years of education: Not on file  . Highest education level: Not on file  Occupational History  . Not on file  Social Needs  . Financial resource strain: Not on file  . Food insecurity:    Worry: Not on file    Inability: Not on file  . Transportation needs:    Medical: Not on file    Non-medical: Not on file  Tobacco Use  . Smoking status: Former Smoker    Packs/day: 1.00    Types: Cigars    Last attempt to quit: 11/2017    Years since quitting: 0.4  . Smokeless tobacco: Never Used  Substance and Sexual Activity  . Alcohol use: Yes      Comment: 2 to 3 per month  . Drug use: Yes    Frequency: 7.0 times per week    Types: Marijuana    Comment: every once in a while  . Sexual activity: Not on file  Lifestyle  . Physical activity:    Days per week: Not on file    Minutes per session: Not on file  . Stress: Not on file  Relationships  . Social connections:    Talks on phone: Not on file    Gets together: Not on file    Attends religious service: Not on file    Active member of club or organization: Not on file    Attends meetings of clubs or organizations: Not on file    Relationship status: Not on file  . Intimate partner violence:    Fear of current or ex partner: Not on file    Emotionally abused: Not on file    Physically abused: Not on file  Forced sexual activity: Not on file  Other Topics Concern  . Not on file  Social History Narrative  . Not on file    Family History:  Family History  Problem Relation Age of Onset  . Diabetes Mother   . Hypertension Mother   . Hypertension Father   . Diabetes Father     Medications:   Current Outpatient Medications on File Prior to Visit  Medication Sig Dispense Refill  . aspirin 325 MG tablet Take 1 tablet (325 mg total) by mouth daily. 30 tablet 5  . atorvastatin (LIPITOR) 80 MG tablet Take 1 tablet (80 mg total) by mouth daily at 6 PM. 30 tablet 5  . clopidogrel (PLAVIX) 75 MG tablet Take 1 tablet (75 mg total) by mouth daily. 30 tablet 5  . nicotine (NICODERM CQ - DOSED IN MG/24 HOURS) 21 mg/24hr patch Place 1 patch (21 mg total) onto the skin daily. 28 patch 2  . NONFORMULARY OR COMPOUNDED ITEM Please evaluate and treat for outpatient physical therapy.  Diagnosis-CVA 1 each 0   No current facility-administered medications on file prior to visit.     Allergies:  No Known Allergies   Physical Exam  Vitals:   04/04/18 1234  BP: 118/72  Pulse: 69  Weight: 174 lb 9.6 oz (79.2 kg)  Height: 6' (1.829 m)   Body mass index is 23.68 kg/m. No  exam data present  General: well developed, well nourished, pleasant middle-aged African-American male, seated, in no evident distress Head: head normocephalic and atraumatic.   Neck: supple with no carotid or supraclavicular bruits Cardiovascular: regular rate and rhythm, no murmurs Musculoskeletal: no deformity Skin:  no rash/petichiae Vascular:  Normal pulses all extremities  Neurologic Exam Mental Status: Awake and fully alert. Oriented to place and time. Recent and remote memory intact. Attention span, concentration and fund of knowledge appropriate. Mood and affect appropriate.  Cranial Nerves: Fundoscopic exam reveals sharp disc margins. Pupils equal, briskly reactive to light. Extraocular movements full without nystagmus. Visual fields full to confrontation. Hearing intact. Facial sensation intact. Face, tongue, palate moves normally and symmetrically.  Motor:  LUE: 4/5 with weak grip strength and limited ROM due to pain and spasticity present in shoulder, elbow, wrist and fingers; LLE: 4+/5 with weak ankle dorsiflexion Sensory.: intact to touch , pinprick , position and vibratory sensation.  Coordination: Rapid alternating movements normal in all extremities. Finger-to-nose and heel-to-shin performed accurately bilaterally. Gait and Station: Arises from chair without difficulty. Stance is normal.  Hemiplegic gait with stiffened left leg with use of cane Reflexes: 2+ LUE; 1+ throughout all other extremities. Toes downgoing.    NIHSS  0 Modified Rankin  2    Diagnostic Data (Labs, Imaging, Testing)  CT HEAD WO CONTRAST 11/20/2017 IMPRESSION: 1. Loss of the normal gray-white matter differentiation in the right pre and postcentral gyri, suspicious for subacute infarct given clinical history. 2. Chronic appearing lacunar infarcts in the bilateral basal ganglia and right pons.  MR BRAIN WO CONTRAST 11/20/2017 IMPRESSION: 1. Multiple small infarcts throughout the right cerebral  hemisphere primarily in a watershed distribution and varying in age from acute to late subacute/chronic. 2. 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 3. Diffusely abnormal appearance of the distal cervical and proximal intracranial portions of the right ICA including mild diffuse narrowing. Head and neck CTA is recommended to further evaluate for a possible dissection or flow limiting proximal stenosis. 4. Diffuse cervical spondylosis with moderate to severe multilevel neural foraminal stenosis as  above. 5. Mild spinal stenosis at C3-4.  CT ANGIO HEAD W OR WO CONTRAST CT ANGIO NECK W OR WO CONTRAST 11/20/2017 IMPRESSION: 1. 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 Secondary downstream attenuation with decreased flow throughout the right ACA and MCA territories as compared to the left. 2. Prominent carotid siphon atherosclerotic change with associated stenoses of up to 75% on the right and 50% on the left. 3. 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 4. 50% stenosis at the origin of the left vertebral artery. 5. Fetal type right PCA.  ECHOCARDIOGRAM 11/21/2017 Impressions: - LVEF 60-65%, moderate LVH, normal wall motion, grade 1 DD,   indeterminate LV filling pressure, trivial MR, normal LA size,   trivial TR, RVSP 25 mmHg, normal IVC.     ASSESSMENT: Nathan Hernandez is a 57 y.o. 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  Patient underwent right carotid enterectomy with stent placement on 11/24/2017 with complication of left retroperitoneal hematoma which was evacuated and EIA ligated. Vascular risk factors include HLD, tobacco use, THC use and alcohol use.  Patient is being seen today for  follow-up visit and overall is doing well but continues to have left hemiparesis with spasticity.    PLAN: -Continue aspirin 325 mg daily and clopidogrel 75 mg daily  and atorvastatin 80 mg for secondary stroke prevention -f/u with vasc surgery as scheduled for repeat carotid duplex along with discussion regarding DAPT -F/u with PCP regarding your HLD and HTN management -Start baclofen 5 mg 3 times daily for muscle spasticity -Advised to schedule appointments for PT/OT at neuro rehab clinic  -Advised patient to limit use of wrist brace and sling as this can worsen his weakness and spasticity.  Did recommend to ensure he continues to perform ROM on his left arm -continue to monitor BP at home -advised to continue to stay active and maintain a healthy diet -Maintain strict control of hypertension with blood pressure goal below 130/90, diabetes with hemoglobin A1c goal below 6.5% and cholesterol with LDL cholesterol (bad cholesterol) goal below 70 mg/dL. I also advised the patient to eat a healthy diet with plenty of whole grains, cereals, fruits and vegetables, exercise regularly and maintain ideal body weight.  Follow up in 2 months or call earlier if needed   Greater than 50% of time during this 25 minute visit was spent on counseling,explanation of diagnosis of right MCA watershed infarcts, reviewing risk factor management of right ICA occlusion s/p endarterectomy with stent placement, HLD, HTN and tobacco/substance abuse, planning of further management, discussion with patient and family and coordination of care    George Hugh, AGNP-BC  [facility] [address] [place], [place] [address]  Phone [phone] Fax [phone] Note: This document was prepared with digital dictation and possible smart phrase technology. Any transcriptional errors that result from this process are unintentional.

## 2018-04-04 NOTE — Patient Instructions (Addendum)
Continue aspirin 325 mg daily and clopidogrel 75 mg daily  and Lipitor 80mg   for secondary stroke prevention  Start baclofen 5mg  for muscle tightness. You can take 1 tablet in the morning and 1 tablet in the evening. If you are tolerating the morning dose without excessive tiredness, you can take 1 tab in the afternoon. If you continue to experience muscle tightness after 2 weeks, please let me know and we can increase medication. If you are unable to tolerate, please let me know and we can talk about possible injections to help relax the muscles.   Please schedule appointments for PT/OT after todays appointment  Continue to stay active and maintain a healthy diet  Continue to follow up with PCP regarding cholesterol and blood pressure management   Follow up with vascular surgery as scheduled for repeat ultrasound - continue both aspirin and plavix until that follow up appointment   Continue to monitor blood pressure at home  Maintain strict control of hypertension with blood pressure goal below 130/90, diabetes with hemoglobin A1c goal below 6.5% and cholesterol with LDL cholesterol (bad cholesterol) goal below 70 mg/dL. I also advised the patient to eat a healthy diet with plenty of whole grains, cereals, fruits and vegetables, exercise regularly and maintain ideal body weight.  Followup in the future with me in 2 months or call earlier if needed       Thank you for coming to see [place] at [facility]. I hope we have been able to provide you high quality care today.  You may receive a patient satisfaction survey over the next few weeks. We would appreciate your feedback and comments so that we may continue to improve ourselves and the health of our patients.

## 2018-04-06 ENCOUNTER — Encounter: Payer: Self-pay | Admitting: Physical Therapy

## 2018-04-06 ENCOUNTER — Ambulatory Visit: Payer: Medicaid Other | Attending: Internal Medicine | Admitting: Physical Therapy

## 2018-04-06 DIAGNOSIS — R2689 Other abnormalities of gait and mobility: Secondary | ICD-10-CM | POA: Diagnosis not present

## 2018-04-06 DIAGNOSIS — M25512 Pain in left shoulder: Secondary | ICD-10-CM | POA: Insufficient documentation

## 2018-04-06 DIAGNOSIS — R278 Other lack of coordination: Secondary | ICD-10-CM | POA: Insufficient documentation

## 2018-04-06 DIAGNOSIS — M6281 Muscle weakness (generalized): Secondary | ICD-10-CM | POA: Diagnosis not present

## 2018-04-06 DIAGNOSIS — R2681 Unsteadiness on feet: Secondary | ICD-10-CM | POA: Insufficient documentation

## 2018-04-06 DIAGNOSIS — I69354 Hemiplegia and hemiparesis following cerebral infarction affecting left non-dominant side: Secondary | ICD-10-CM | POA: Insufficient documentation

## 2018-04-06 MED FILL — CLOPIDOGREL 75 MG TABLET: 75 | 30 days supply | Qty: 30 | Fill #2

## 2018-04-06 MED FILL — ATORVASTATIN 80 MG TABLET: 80 | 30 days supply | Qty: 30 | Fill #2

## 2018-04-06 NOTE — Therapy (Signed)
[facility] [address] [place], [place], [address] Phone: [phone]   Fax:  [phone]  Physical Therapy Evaluation  Patient Details  Name: Nathan Hernandez MRN: [id number] Date of Birth: [birth year] Referring Provider (PT): George Hugh, NP   Encounter Date: 04/06/2018  PT End of Session - 04/06/18 1308    Visit Number  1    Number of Visits  4    Date for PT Re-Evaluation  05/04/18    Authorization Type  Medicaid    PT Start Time  1102    PT Stop Time  1140    PT Time Calculation (min)  38 min    Activity Tolerance  Patient tolerated treatment well    Behavior During Therapy  [facility] for tasks assessed/performed       Past Medical History:  Diagnosis Date  . HLD (hyperlipidemia)   . Hypertension   . Osteoarthritis   . Polysubstance abuse (HCC)   . Stroke [facility])     Past Surgical History:  Procedure Laterality Date  . GROIN DEBRIDEMENT Left 11/24/2017   Procedure: EVACUATION OF LEFT GROIN HEMATOMA, REPAIR OF LEFT FEMORAL ARTERY BRACH;  Surgeon: Larina Earthly, MD;  Location: [facility] OR;  Service: Vascular;  Laterality: Left;  . KNEE SURGERY    . TRANSCAROTID ARTERY REVASCULARIZATION (TCAR)  11/24/2017  . TRANSCAROTID ARTERY REVASCULARIZATION Right 11/24/2017   Procedure: TRANSCAROTID ARTERY REVASCULARIZATION;  Surgeon: Nada Libman, MD;  Location: [facility] OR;  Service: Vascular;  Laterality: Right;    There were no vitals filed for this visit.   Subjective Assessment - 04/06/18 1104    Subjective  Patient s/p hospital d/ due to CVA. Now walking with [facility] - walks with it all the time for balance - doing well. Biggest complaint is L UE weakness and pain and L LE weakness. No reports of falls. Seen at [facility] this week - will follow back up with them in January.     Pertinent History  HTN, B watershed infarction, HLD, polysubstance abuse    Limitations  Walking    How long can you walk comfortably?  20-30 min     Patient Stated Goals  reduce reliance on cane    Currently in Pain?  Yes    Pain Score  3     Pain Location  Shoulder   and hand   Pain Orientation  Left    Pain Descriptors / Indicators  Aching;Discomfort    Pain Type  Acute pain    Pain Onset  More than a month ago    Pain Frequency  Intermittent    Aggravating Factors   overuse    Pain Relieving Factors  rest         [facility] PT Assessment - 04/06/18 1110      Assessment   Medical Diagnosis  CVA    Referring Provider (PT)  George Hugh, NP    Onset Date/Surgical Date  11/20/17    Prior Therapy  acute PT      Precautions   Precautions  Fall      Restrictions   Weight Bearing Restrictions  No      Balance Screen   Has the patient fallen in the past 6 months  No    Has the patient had a decrease in activity level because of a fear of falling?   No    Is the patient reluctant to leave their home because of a fear of falling?  No      Home Environment   Living Environment  Private residence    Living Arrangements  Parent    Type of Home  House    Home Access  Ramped entrance    Home Layout  One level    Home Equipment  Cane - quad    Additional Comments  neoprene brace at B knee      Prior Function   Level of Independence  Independent    Vocation  On disability    Leisure  walking      Cognition   Overall Cognitive Status  Within Functional Limits for tasks assessed      Sensation   Light Touch  Appears Intact      Coordination   Gross Motor Movements are Fluid and Coordinated  Yes    Heel Shin Test  normal      Posture/Postural Control   Posture/Postural Control  Postural limitations    Postural Limitations  Rounded Shoulders;Forward head      ROM / Strength   AROM / PROM / Strength  Strength      Strength   Strength Assessment Site  Hip;Knee    Right/Left Hip  Right;Left    Right Hip Flexion  4+/5    Left Hip Flexion  4-/5    Right/Left Knee  Right;Left    Right Knee Flexion  5/5    Right  Knee Extension  5/5    Left Knee Flexion  4+/5    Left Knee Extension  4-/5      Ambulation/Gait   Ambulation/Gait  Yes    Ambulation/Gait Assistance  5: Supervision    Ambulation/Gait Assistance Details  close supervision for safety with SBQC, slower pace of gait with patient reporting cautiondue to reduced balance    Ambulation Distance (Feet)  100 Feet    Assistive device  Small based quad cane    Gait Pattern  Step-through pattern;Decreased stance time - left;Decreased step length - right;Decreased hip/knee flexion - left;Decreased weight shift to left    Ambulation Surface  Level;Indoor    Gait velocity  2.5 ft/sec    Stairs  Yes    Stairs Assistance  5: Supervision    Stair Management Technique  One rail Left;With cane;Alternating pattern;Step to pattern    Number of Stairs  4    Height of Stairs  6      Balance   Balance Assessed  Yes      Standardized Balance Assessment   Standardized Balance Assessment  Five Times Sit to Stand;Berg Balance Test    Five times sit to stand comments   21.35      Berg Balance Test   Sit to Stand  Able to stand without using hands and stabilize independently    Standing Unsupported  Able to stand safely 2 minutes    Sitting with Back Unsupported but Feet Supported on Floor or Stool  Able to sit safely and securely 2 minutes    Stand to Sit  Sits safely with minimal use of hands    Transfers  Able to transfer safely, minor use of hands    Standing Unsupported with Eyes Closed  Able to stand 10 seconds with supervision    Standing Ubsupported with Feet Together  Able to place feet together independently and stand for 1 minute with supervision    From Standing, Reach Forward with Outstretched Arm  Can reach confidently >25 cm (10")    From Standing  Position, Pick up Object from Floor  Able to pick up shoe, needs supervision    From Standing Position, Turn to Look Behind Over each Shoulder  Looks behind from both sides and weight shifts well     Turn 360 Degrees  Able to turn 360 degrees safely one side only in 4 seconds or less    Standing Unsupported, Alternately Place Feet on Step/Stool  Able to stand independently and complete 8 steps >20 seconds    Standing Unsupported, One Foot in Front  Able to plae foot ahead of the other independently and hold 30 seconds    Standing on One Leg  Able to lift leg independently and hold > 10 seconds    Total Score  50    Berg comment:  50/56 - low fall risk      Functional Gait  Assessment   Gait assessed   Yes    Gait Level Surface  Walks 20 ft, slow speed, abnormal gait pattern, evidence for imbalance or deviates 10-15 in outside of the 12 in walkway width. Requires more than 7 sec to ambulate 20 ft.    Change in Gait Speed  Able to change speed, demonstrates mild gait deviations, deviates 6-10 in outside of the 12 in walkway width, or no gait deviations, unable to achieve a major change in velocity, or uses a change in velocity, or uses an assistive device.    Gait with Horizontal Head Turns  Performs head turns with moderate changes in gait velocity, slows down, deviates 10-15 in outside 12 in walkway width but recovers, can continue to walk.    Gait with Vertical Head Turns  Performs task with slight change in gait velocity (eg, minor disruption to smooth gait path), deviates 6 - 10 in outside 12 in walkway width or uses assistive device    Gait and Pivot Turn  Turns slowly, requires verbal cueing, or requires several small steps to catch balance following turn and stop    Step Over Obstacle  Is able to step over one shoe box (4.5 in total height) but must slow down and adjust steps to clear box safely. May require verbal cueing.    Gait with Narrow Base of Support  Ambulates less than 4 steps heel to toe or cannot perform without assistance.    Gait with Eyes Closed  Walks 20 ft, slow speed, abnormal gait pattern, evidence for imbalance, deviates 10-15 in outside 12 in walkway width. Requires  more than 9 sec to ambulate 20 ft.    Ambulating Backwards  Walks 20 ft, slow speed, abnormal gait pattern, evidence for imbalance, deviates 10-15 in outside 12 in walkway width.    Steps  Two feet to a stair, must use rail.    Total Score  11    FGA comment:  11/30 - high fall risk                Objective measurements completed on examination: See above findings.              PT Education - 04/06/18 1307    Education Details  exam findings, fall risk, justification for skilled treatment    Person(s) Educated  Patient    Methods  Explanation    Comprehension  Verbalized understanding          PT Long Term Goals - 04/06/18 1316      PT LONG TERM GOAL #1   Title  patient to be independent with HEP  for LE strength and balance     Baseline  will initiate at 1st treatment session    Time  3    Period  Weeks    Status  New    Target Date  05/04/18      PT LONG TERM GOAL #2   Title  Patient to improve FGA by >/= 4 points     Baseline  04/06/18: 11/30    Time  3    Period  Weeks    Status  New    Target Date  05/04/18      PT LONG TERM GOAL #3   Title  Patient to demonstrate appropriate gait mechanics with SPC demonstrating improved functional mobility with LRAD    Time  3    Period  Weeks    Status  New    Target Date  05/04/18      PT LONG TERM GOAL #4   Title  Patient to improve 5x sit to stand by >/= 5 seconds for improved balance and mobility    Baseline  04/06/18: 21.35 seconds    Time  3    Period  Weeks    Status  New    Target Date  05/04/18      PT LONG TERM GOAL #5   Title  patient to improve gait speed by >/= 0.5 ft/sec    Baseline  04/06/18: 2.5 ft/sec with [facility]    Time  3    Period  Weeks    Status  New    Target Date  05/04/18             Plan - 04/06/18 1308    Clinical Impression Statement  Mr. 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 Patient today ambulating with [facility] with reduced hip/knee flexion of L LE demonstrating functional weakness. On Berg scoring 50/56, however with more dynamic testing on the FGA patient scoring 11/30 demonstrating high fall risk. Reduced gait speed noted with patient ambulating at 2.5 ft/sec which is lower than that of a community ambulator. Patient with goals to improve strength and balance to reduce reliance on cane and allow for a more active lifestyle. Patient to benefit from skilled PT intervention to address the above listed deficits for improved strength, balance, safety, functional mobility.     History and Personal Factors relevant to plan of care:  HTN, B watershed infarction, HLD, polysubstance abuse    Clinical Presentation  Evolving    Clinical Presentation due to:  HTN, B watershed infarction, HLD, polysubstance abuse, does not drive    Clinical Decision Making  Moderate    Rehab Potential  Good    PT Frequency  1x / week    PT Duration  3 weeks    PT Treatment/Interventions  ADLs/Self Care Home Management;Electrical Stimulation;Moist Heat;Therapeutic exercise;Therapeutic activities;Functional mobility training;Stair training;Gait training;DME Instruction;Balance training;Neuromuscular re-education;Patient/family education;Manual techniques;Vasopneumatic Device;Taping;Passive range of motion    PT Next Visit Plan  initiate HEP for balance and strengthening    Consulted and Agree with Plan of Care  Patient       Patient will benefit from skilled therapeutic intervention in order to improve the following deficits and impairments:  Abnormal gait, Decreased activity tolerance, Decreased balance, Decreased coordination, Decreased mobility, Difficulty walking, Decreased strength, Decreased endurance, Pain  Visit Diagnosis: Unsteadiness on feet  Other abnormalities of gait and  mobility  Muscle weakness (generalized)     Problem  List Patient Active Problem List   Diagnosis Date Noted  . Carotid artery disease (HCC) 11/25/2017  . Carotid stenosis 11/24/2017  . Hyperlipidemia 11/21/2017  . Marijuana use 11/21/2017  . Acute bilat watershed infarction [facility]) 11/20/2017  . Dyslipidemia 11/20/2017  . Hypertension 11/20/2017  . Polysubstance abuse (HCC) 11/20/2017  . HLD (hyperlipidemia) 05/20/2014  . Osteoarthritis of right knee 11/27/2013  . Establishing care with new doctor, encounter for 04/05/2013  . Poor vision 04/05/2013  . High blood pressure 04/05/2013  . Knee pain, acute 04/05/2013     Kipp Laurence, PT, DPT Supplemental Physical Therapist 04/06/18 3:47 PM Pager: [phone] Office: [phone]  [facility] [address] [place], [place], [address] Phone: [phone]   Fax:  [phone]  Name: Nathan Hernandez MRN: [id number] Date of Birth: Jan 02, 1961

## 2018-04-08 NOTE — Progress Notes (Signed)
I agree with the above plan 

## 2018-04-10 ENCOUNTER — Ambulatory Visit (INDEPENDENT_AMBULATORY_CARE_PROVIDER_SITE_OTHER): Payer: Medicaid Other | Admitting: Physician Assistant

## 2018-04-10 ENCOUNTER — Other Ambulatory Visit: Payer: Self-pay

## 2018-04-10 ENCOUNTER — Ambulatory Visit: Payer: Medicaid Other | Admitting: Physical Therapy

## 2018-04-10 ENCOUNTER — Encounter (INDEPENDENT_AMBULATORY_CARE_PROVIDER_SITE_OTHER): Payer: Self-pay | Admitting: Physician Assistant

## 2018-04-10 VITALS — BP 121/77 | HR 69 | Temp 97.8°F | Ht 72.0 in | Wt 169.4 lb

## 2018-04-10 DIAGNOSIS — Z23 Encounter for immunization: Secondary | ICD-10-CM

## 2018-04-10 DIAGNOSIS — Z0000 Encounter for general adult medical examination without abnormal findings: Secondary | ICD-10-CM | POA: Diagnosis not present

## 2018-04-10 DIAGNOSIS — D649 Anemia, unspecified: Secondary | ICD-10-CM | POA: Diagnosis not present

## 2018-04-10 DIAGNOSIS — G8929 Other chronic pain: Secondary | ICD-10-CM

## 2018-04-10 DIAGNOSIS — M25512 Pain in left shoulder: Secondary | ICD-10-CM

## 2018-04-10 DIAGNOSIS — F17200 Nicotine dependence, unspecified, uncomplicated: Secondary | ICD-10-CM

## 2018-04-10 DIAGNOSIS — Z1211 Encounter for screening for malignant neoplasm of colon: Secondary | ICD-10-CM

## 2018-04-10 DIAGNOSIS — Z125 Encounter for screening for malignant neoplasm of prostate: Secondary | ICD-10-CM | POA: Diagnosis not present

## 2018-04-10 DIAGNOSIS — E559 Vitamin D deficiency, unspecified: Secondary | ICD-10-CM | POA: Diagnosis not present

## 2018-04-10 MED ORDER — NICOTINE 7 MG/24HR TD PT24: 7.0000 mg | MEDICATED_PATCH | Freq: Every day | TRANSDERMAL | 0 refills | Status: DC

## 2018-04-10 MED ORDER — NICOTINE 21 MG/24HR TD PT24: 21.0000 mg | MEDICATED_PATCH | Freq: Every day | TRANSDERMAL | 0 refills | Status: DC

## 2018-04-10 MED ORDER — NICOTINE 14 MG/24HR TD PT24: 14.0000 mg | MEDICATED_PATCH | Freq: Every day | TRANSDERMAL | 0 refills | Status: DC

## 2018-04-10 MED FILL — NICOTINE 21 MG/24HR PATCH: 21 | 28 days supply | Qty: 28 | Fill #0

## 2018-04-10 MED FILL — NICOTINE 14 MG/24HR PATCH: 14 | 28 days supply | Qty: 28 | Fill #0

## 2018-04-10 MED FILL — NICOTINE 7 MG/24HR PATCH: 7 | 28 days supply | Qty: 28 | Fill #0

## 2018-04-10 NOTE — Progress Notes (Signed)
Subjective:  Patient ID: Nathan Hernandez, male    DOB: 07-11-1960  Age: 57 y.o. MRN: [id number]  CC: annual exam  HPI Nathan Hernandez is a 57 y.o. male with a medical history of HTN, HLD, CVA, polysubstance abuse, OA, vitamin D deficiency, and stenosis of right carotid artery s/p transcarotid artery revascularization presents for annual physical exam. Still has residual neurological deficits of the LUE and LLE after his stroke. Was not able to start neuro rehab until two weeks ago when his insurance became effective. Seeing neurology for chronic left shoulder pain. Prescribed Baclofen but is ineffective. Has limited aROM of left shoulder and can not fully abduct or flex shoulder.      Pt smoking two black and mild cigarettes per week. Would like nicotine patch refill to help him stop smoking completely.        Outpatient Medications Prior to Visit  Medication Sig Dispense Refill  . aspirin 325 MG tablet Take 1 tablet (325 mg total) by mouth daily. 30 tablet 5  . atorvastatin (LIPITOR) 80 MG tablet Take 1 tablet (80 mg total) by mouth daily at 6 PM. 30 tablet 5  . Baclofen 5 MG TABS Take 5 mg by mouth 3 (three) times daily. 90 tablet 3  . clopidogrel (PLAVIX) 75 MG tablet Take 1 tablet (75 mg total) by mouth daily. 30 tablet 5  . nicotine (NICODERM CQ - DOSED IN MG/24 HOURS) 21 mg/24hr patch Place 1 patch (21 mg total) onto the skin daily. 28 patch 2  . NONFORMULARY OR COMPOUNDED ITEM Please evaluate and treat for outpatient physical therapy.  Diagnosis-CVA 1 each 0   No facility-administered medications prior to visit.      ROS Review of Systems  Constitutional: Negative for chills, fever and malaise/fatigue.  Eyes: Negative for blurred vision.  Respiratory: Negative for shortness of breath.   Cardiovascular: Negative for chest pain and palpitations.  Gastrointestinal: Negative for abdominal pain and nausea.  Genitourinary: Negative for dysuria and hematuria.  Musculoskeletal:  Negative for joint pain and myalgias.  Skin: Negative for rash.  Neurological: Negative for tingling and headaches.  Psychiatric/Behavioral: Negative for depression. The patient is not nervous/anxious.     Objective:  Ht 6' (1.829 m)   Wt 169 lb 6.4 oz (76.8 kg)   BMI 22.97 kg/m   BP/Weight 04/10/2018 04/04/2018 01/08/2018  Systolic BP - 118 111  Diastolic BP - 72 73  Wt. (Lbs) 169.4 174.6 157  BMI 22.97 23.68 21.29      Physical Exam  Constitutional: He is oriented to person, place, and time.  Well developed, well nourished, NAD, polite  HENT:  Head: Normocephalic and atraumatic.  Eyes: No scleral icterus.  Neck: Normal range of motion. Neck supple. No thyromegaly present.  Cardiovascular: Normal rate, regular rhythm and normal heart sounds.  Pulmonary/Chest: Effort normal and breath sounds normal.  Abdominal: Soft. Bowel sounds are normal. There is no tenderness.  Musculoskeletal: He exhibits no edema.  Limited aROM and pROM of the left shoulder, limited to approximately 70-75 degrees of flexion and abduction.   Neurological: He is alert and oriented to person, place, and time.  LUE and LLE strength 4/5.   Skin: Skin is warm and dry. No rash noted. No erythema. No pallor.  Psychiatric: He has a normal mood and affect. His behavior is normal. Thought content normal.  Vitals reviewed.    Assessment & Plan:    1. Annual physical exam - Basic Metabolic Panel - Lipid panel  2. Vitamin D deficiency - VITAMIN D 25 Hydroxy (Vit-D Deficiency, Fractures)  3. Anemia, unspecified type - CBC with Differential  4. Screening for colon cancer - Fecal occult blood, imunochemical  5. Need for Tdap vaccination - Tdap vaccine greater than or equal to 7yo IM  6. Need for prophylactic vaccination and inoculation against influenza - Flu Vaccine QUAD 6+ mos PF IM (Fluarix Quad PF)  7. Screening for prostate cancer - PSA  8. Tobacco use disorder - nicotine (NICODERM CQ -  DOSED IN MG/24 HOURS) 21 mg/24hr patch; Place 1 patch (21 mg total) onto the skin daily.  Dispense: 28 patch; Refill: 0 - nicotine (NICODERM CQ - DOSED IN MG/24 HOURS) 14 mg/24hr patch; Place 1 patch (14 mg total) onto the skin daily.  Dispense: 28 patch; Refill: 0 - nicotine (NICODERM CQ - DOSED IN MG/24 HR) 7 mg/24hr patch; Place 1 patch (7 mg total) onto the skin daily.  Dispense: 28 patch; Refill: 0  9. Chronic left shoulder pain - DG Shoulder Left; Future - AMB referral to orthopedics   Meds ordered this encounter  Medications  . nicotine (NICODERM CQ - DOSED IN MG/24 HOURS) 21 mg/24hr patch    Sig: Place 1 patch (21 mg total) onto the skin daily.    Dispense:  28 patch    Refill:  0    Order Specific Question:   Supervising Provider    Answer:   Nathan Hernandez [[id number]]  . nicotine (NICODERM CQ - DOSED IN MG/24 HOURS) 14 mg/24hr patch    Sig: Place 1 patch (14 mg total) onto the skin daily.    Dispense:  28 patch    Refill:  0    Order Specific Question:   Supervising Provider    Answer:   Nathan Hernandez [[id number]]  . nicotine (NICODERM CQ - DOSED IN MG/24 HR) 7 mg/24hr patch    Sig: Place 1 patch (7 mg total) onto the skin daily.    Dispense:  28 patch    Refill:  0    Order Specific Question:   Supervising Provider    Answer:   Nathan Hernandez [[id number]]    Follow-up: Return in about 6 months (around 10/09/2018) for General f/u.   Nathan Specter PA

## 2018-04-10 NOTE — Patient Instructions (Signed)
Prostate-Specific Antigen Test Why am I having this test? The prostate-specific antigen (PSA) test is performed to determine how much PSA you have in your blood. PSA is a type of protein that is normally present in the prostate gland. Certain conditions can cause PSA blood levels to increase, such as:  Infection in the prostate (prostatitis).  Enlargement of the prostate (hypertrophy).  Prostate cancer.  Because PSA levels increase greatly from prostate cancer, this test can be used to confirm a diagnosis of prostate cancer. It may also be used to monitor treatment for prostate cancer and to watch for a return of prostate cancer after treatment has finished. This test has a very high false-positive rate. Therefore, routine PSA screening for all men is no longer recommended. A false-positive result is incorrect because it indicates a condition or finding is present when it is not. What kind of sample is taken? A blood sample is required for this test. It is usually collected by inserting a needle into a vein or by sticking a finger with a small needle. How do I prepare for this test? There is no preparation required for this test. However, there are factors that can affect the results of a PSA test. To get the most accurate results:  Avoid having a rectal exam within several hours before having your blood drawn for this test.  Avoid having any procedures performed on the prostate gland within 6 weeks of having this test.  Avoid ejaculating within 24 hours of having this test.  Tell your health care provider if you had a recent urinary tract infection (UTI).  Tell your health care provider if you are taking medicines to assist with hair growth, such as finasteride.  Tell your health care provider if you have been exposed to a medicine called diethylstilbestrol.  Let your health care provider know if any of these factors apply to you. You may be asked to reschedule the test. What are the  reference ranges? Reference ranges are established after testing a large group of people. Reference ranges may vary among different people, labs, and hospitals. It is your responsibility to obtain your test results. Ask the lab or department performing the test when and how you will get your results.  Low: 0-2.5 ng/mL.  Slightly to moderately elevated: 2.6-10.0 ng/mL.  Moderately elevated: 10.0-19.9 ng/mL.  Significantly elevated: 20 ng/mL or greater.  What do the results mean? PSA test results greater than 4 ng/mL are found in the majority of men with prostate cancer. If your test result is above this level, this can indicate an increased risk for prostate cancer. Increased PSA levels can also indicate other health conditions. Talk with your health care provider to discuss your results, treatment options, and if necessary, the need for more tests. Talk with your health care provider if you have any questions about your results. Talk with your health care provider to discuss your results, treatment options, and if necessary, the need for more tests. Talk with your health care provider if you have any questions about your results. This information is not intended to replace advice given to you by your health care provider. Make sure you discuss any questions you have with your health care provider. Document Released: 06/25/2004 Document Revised: 01/27/2016 Document Reviewed: 10/16/2013 Elsevier Interactive Patient Education  2018 Elsevier Inc.  

## 2018-04-11 ENCOUNTER — Other Ambulatory Visit (INDEPENDENT_AMBULATORY_CARE_PROVIDER_SITE_OTHER): Payer: Self-pay | Admitting: Physician Assistant

## 2018-04-11 ENCOUNTER — Telehealth (INDEPENDENT_AMBULATORY_CARE_PROVIDER_SITE_OTHER): Payer: Self-pay

## 2018-04-11 DIAGNOSIS — E559 Vitamin D deficiency, unspecified: Secondary | ICD-10-CM

## 2018-04-11 LAB — CBC WITH DIFFERENTIAL/PLATELET
BASOS ABS: 0.1 10*3/uL (ref 0.0–0.2)
Basos: 1 %
EOS (ABSOLUTE): 0.3 10*3/uL (ref 0.0–0.4)
Eos: 5 %
Hematocrit: 42.2 % (ref 37.5–51.0)
Hemoglobin: 13.9 g/dL (ref 13.0–17.7)
IMMATURE GRANS (ABS): 0 10*3/uL (ref 0.0–0.1)
IMMATURE GRANULOCYTES: 0 %
LYMPHS: 39 %
Lymphocytes Absolute: 2.4 10*3/uL (ref 0.7–3.1)
MCH: 25.2 pg — AB (ref 26.6–33.0)
MCHC: 32.9 g/dL (ref 31.5–35.7)
MCV: 77 fL — ABNORMAL LOW (ref 79–97)
Monocytes Absolute: 0.7 10*3/uL (ref 0.1–0.9)
Monocytes: 12 %
Neutrophils Absolute: 2.6 10*3/uL (ref 1.4–7.0)
Neutrophils: 43 %
PLATELETS: 354 10*3/uL (ref 150–450)
RBC: 5.51 x10E6/uL (ref 4.14–5.80)
RDW: 16.4 % — ABNORMAL HIGH (ref 12.3–15.4)
WBC: 6.1 10*3/uL (ref 3.4–10.8)

## 2018-04-11 LAB — LIPID PANEL
Chol/HDL Ratio: 3 ratio (ref 0.0–5.0)
Cholesterol, Total: 151 mg/dL (ref 100–199)
HDL: 51 mg/dL (ref >39)
LDL Calculated: 80 mg/dL (ref 0–99)
Triglycerides: 98 mg/dL (ref 0–149)
VLDL CHOLESTEROL CAL: 20 mg/dL (ref 5–40)

## 2018-04-11 LAB — BASIC METABOLIC PANEL
BUN / CREAT RATIO: 8 — AB (ref 9–20)
BUN: 6 mg/dL (ref 6–24)
CHLORIDE: 100 mmol/L (ref 96–106)
CO2: 20 mmol/L (ref 20–29)
Calcium: 9.4 mg/dL (ref 8.7–10.2)
Creatinine, Ser: 0.78 mg/dL (ref 0.76–1.27)
GFR calc Af Amer: 117 mL/min/{1.73_m2} (ref >59)
GFR calc non Af Amer: 101 mL/min/{1.73_m2} (ref >59)
GLUCOSE: 92 mg/dL (ref 65–99)
POTASSIUM: 4.1 mmol/L (ref 3.5–5.2)
Sodium: 139 mmol/L (ref 134–144)

## 2018-04-11 LAB — PSA: PROSTATE SPECIFIC AG, SERUM: 1 ng/mL (ref 0.0–4.0)

## 2018-04-11 LAB — VITAMIN D 25 HYDROXY (VIT D DEFICIENCY, FRACTURES): Vit D, 25-Hydroxy: 10.9 ng/mL — ABNORMAL LOW (ref 30.0–100.0)

## 2018-04-11 MED ORDER — VITAMIN D (ERGOCALCIFEROL) 1.25 MG (50000 UNIT) PO CAPS: 50000.0000 [IU] | ORAL_CAPSULE | ORAL | 0 refills | Status: DC

## 2018-04-11 MED FILL — VIT D2 1.25 MG (50,000 UNIT: 1.25 MG | 28 days supply | Qty: 4 | Fill #0

## 2018-04-11 NOTE — Telephone Encounter (Signed)
-----   Message from Loletta Specter, PA-C sent at 04/11/2018 12:34 PM EST ----- No longer anemic. Prostate normal. Cholesterol normal. Vitamin D is too low, I have sent Vit D 50K IU to [facility].

## 2018-04-11 NOTE — Telephone Encounter (Signed)
Patient is aware that he is no longer anemic. Also aware that prostate, cholesterol normal. Vitamin D is too low, vitamin D has been sent to [facility] pharmacy. Maryjean Morn, CMA

## 2018-04-12 ENCOUNTER — Ambulatory Visit (HOSPITAL_COMMUNITY)
Admission: RE | Admit: 2018-04-12 | Discharge: 2018-04-12 | Disposition: A | Discharge status: Home / Self Care | Payer: Medicaid Other | Source: Ambulatory Visit | Attending: Physician Assistant | Admitting: Physician Assistant

## 2018-04-12 DIAGNOSIS — G8929 Other chronic pain: Secondary | ICD-10-CM

## 2018-04-12 DIAGNOSIS — M19012 Primary osteoarthritis, left shoulder: Secondary | ICD-10-CM | POA: Diagnosis not present

## 2018-04-12 DIAGNOSIS — M25512 Pain in left shoulder: Secondary | ICD-10-CM | POA: Insufficient documentation

## 2018-04-13 ENCOUNTER — Telehealth (INDEPENDENT_AMBULATORY_CARE_PROVIDER_SITE_OTHER): Payer: Self-pay

## 2018-04-13 NOTE — Telephone Encounter (Signed)
Patient is aware that XR revealed slight degenerative change at the head of the humerus. Nothing significant found. Patient stated he is scheduled to see ortho next week. Maryjean Morn, CMA

## 2018-04-13 NOTE — Telephone Encounter (Signed)
-----   Message from Loletta Specter, PA-C sent at 04/13/2018 12:40 PM EST ----- Slight degenerative change at the head of the humerus. Nothing significant found.

## 2018-04-16 ENCOUNTER — Ambulatory Visit: Payer: Medicaid Other | Admitting: Occupational Therapy

## 2018-04-16 DIAGNOSIS — M25512 Pain in left shoulder: Secondary | ICD-10-CM | POA: Diagnosis not present

## 2018-04-16 DIAGNOSIS — R278 Other lack of coordination: Secondary | ICD-10-CM

## 2018-04-16 DIAGNOSIS — M6281 Muscle weakness (generalized): Secondary | ICD-10-CM | POA: Diagnosis not present

## 2018-04-16 DIAGNOSIS — I69354 Hemiplegia and hemiparesis following cerebral infarction affecting left non-dominant side: Secondary | ICD-10-CM

## 2018-04-16 DIAGNOSIS — R2681 Unsteadiness on feet: Secondary | ICD-10-CM | POA: Diagnosis not present

## 2018-04-16 DIAGNOSIS — R2689 Other abnormalities of gait and mobility: Secondary | ICD-10-CM | POA: Diagnosis not present

## 2018-04-16 NOTE — Therapy (Signed)
[facility] [address] [place], [place], [address] Phone: [phone]   Fax:  [phone]  Occupational Therapy Evaluation  Patient Details  Name: Nathan Hernandez MRN: [id number] Date of Birth: [birth year] Referring Provider (OT): George Hugh   Encounter Date: 04/16/2018  OT End of Session - 04/16/18 1445    Visit Number  1    Number of Visits  15    Date for OT Re-Evaluation  06/05/18    Authorization Type  MCD - awaiting approval    OT Start Time  1015    OT Stop Time  1100    OT Time Calculation (min)  45 min    Activity Tolerance  Patient limited by pain    Behavior During Therapy  [facility] for tasks assessed/performed       Past Medical History:  Diagnosis Date  . HLD (hyperlipidemia)   . Hypertension   . Osteoarthritis   . Polysubstance abuse (HCC)   . Stroke [facility])     Past Surgical History:  Procedure Laterality Date  . GROIN DEBRIDEMENT Left 11/24/2017   Procedure: EVACUATION OF LEFT GROIN HEMATOMA, REPAIR OF LEFT FEMORAL ARTERY BRACH;  Surgeon: Larina Earthly, MD;  Location: [facility] OR;  Service: Vascular;  Laterality: Left;  . KNEE SURGERY    . TRANSCAROTID ARTERY REVASCULARIZATION (TCAR)  11/24/2017  . TRANSCAROTID ARTERY REVASCULARIZATION Right 11/24/2017   Procedure: TRANSCAROTID ARTERY REVASCULARIZATION;  Surgeon: Nada Libman, MD;  Location: [facility];  Service: Vascular;  Laterality: Right;    There were no vitals filed for this visit.  Subjective Assessment - 04/16/18 1021    Pertinent History  CVA 11/20/17. PMH: multiple small Rt cerebral watershed infarcts, and bilateral BG and pontine infarcts, HTN, HLD, polysubstance abuse    Currently in Pain?  Yes    Pain Score  7     Pain Location  Arm    Pain Orientation  Left    Pain Descriptors / Indicators  Pounding    Pain Type  Acute pain    Pain Onset  More than a month ago    Pain Frequency  Intermittent    Aggravating Factors   certain  movements    Pain Relieving Factors  rest        [facility] OT Assessment - 04/16/18 0001      Assessment   Medical Diagnosis  CVA    Referring Provider (OT)  George Hugh    Onset Date/Surgical Date  11/20/17    Hand Dominance  Right    Prior Therapy  acute PT, OT      Precautions   Precautions  Fall      Balance Screen   Has the patient fallen in the past 6 months  No      Home  Environment   Bathroom Shower/Tub  Tub/Shower unit;Curtain    Home Equipment  [place] -quad    Additional Comments  Pt lives w/ mom in 1 story home, ramp for back entrance.     Lives With  --   lives w/ mother     Prior Function   Level of Independence  Independent with basic ADLs   but having increasing difficulty LUE before stroke in June   Vocation  Unemployed   filing for disability   Leisure  walking      ADL   Eating/Feeding  Modified independent    Grooming  Modified independent    Upper Body Bathing  Modified independent  Lower Body Bathing  Modified independent    Upper Body Dressing  Increased time   Difficulty d/t pain Lt shoulder   Lower Body Dressing  Increased time    Toilet Transfer  Modified independent    Tub/Shower Transfer  Modified independent   pt reports decreased balance      IADL   Shopping  --   Pt's mother does majority   Light Housekeeping  Performs light daily tasks such as dishwashing, bed making   helps with laundry   Meal Prep  Able to complete simple cold meal and snack prep;Able to complete simple warm meal prep   per pt report   Community Mobility  Relies on family or friends for transportation   or GTA   Medication Management  Is responsible for taking medication in correct dosages at correct time    Financial Management  --   Mom handles     Mobility   Mobility Status Comments  walks w/ quad cane      Written Expression   Dominant Hand  Right    Handwriting  --   denies change     Vision - History   Baseline Vision  Wears glasses only  for reading    Additional Comments  Pt reports decreased acuity over the past several years      Cognition   Overall Cognitive Status  History of cognitive impairments - at baseline   per pt report (mild)    Cognition Comments  Pt reports memory was decreasing prior to strokes      Observation/Other Assessments   Observations  Pt w/ h/o multiple small infarcts. MRI showed chronic bilateral BG and pontine infarcts, multiple small Rt cerebral watershed infarcts at various ages. Pt primarily affected Lt side with shoulder pain. Pt with noted decreased scapula mobility, goes into shoulder hiking and extremely guarded d/t pain. Also ? impingement or possible rotator cuff due to increased pain w/ shoulder abduction and lowering.       Sensation   Additional Comments  appears intact BUE's for light touch/localization and 2 pt. discrimination. Pt reports diminshed LT hand      Coordination   9 Hole Peg Test  Right;Left    Right 9 Hole Peg Test  22.62 SEC    Left 9 Hole Peg Test  68.41 sec.       Edema   Edema  mild Rt hand      Tone   Assessment Location  Left Upper Extremity      ROM / Strength   AROM / PROM / Strength  AROM      AROM   Overall AROM Comments  RUE WNL's. LUE: pain significant w/ ROM in flexion and worse in abduction. Pt only has approx 70-80* sh. flex, approx 60* abd. Elbow flex/ext WFL's, supination to 75% w/ mild tone noted. Composite finger flexion 80-90%      Hand Function   Right Hand Grip (lbs)  87 lbs    Left Hand Grip (lbs)  24 lbs      LUE Tone   LUE Tone  Mild;Modified Ashworth      LUE Tone   Modified Ashworth Scale for Grading Hypertonia LUE  Slight increase in muscle tone, manifested by a catch and release or by minimal resistance at the end of the range of motion when the affected part(s) is moved in flexion or extension   w/ elbow extension and forearm supination (did not assess sh  OT Short Term Goals -  04/16/18 1454      OT SHORT TERM GOAL #1   Title  Independent with initial HEP for LUE - 05/15/18    Baseline  dependent    Time  4    Period  Weeks    Status  New      OT SHORT TERM GOAL #2   Title  Pt able to achieve LUE shoulder flexion to 60* w/ pain 3/10 or under for low range functional reaching using only min compensations    Baseline  7/10 - 10/10 pain w/ max compensations at 70*    Time  4    Period  Weeks    Status  New      OT SHORT TERM GOAL #3   Title  Pt to improve coordination Lt hand as evidenced by reducing speed on 9 hole peg test to 60 sec. or less     Baseline  68.41 sec.     Time  4    Period  Weeks    Status  New      OT SHORT TERM GOAL #4   Title  Pt to perform low level lateral reaching LUE to 50* w/ pain less than or equal to 3/10    Baseline  10/10 pain    Time  4    Period  Weeks    Status  New        OT Long Term Goals - 04/16/18 1500      OT LONG TERM GOAL #1   Title  Independent with updated HEP for LUE    Baseline  Dependent    Time  7    Period  Weeks    Status  New      OT LONG TERM GOAL #2   Title  Pt to demo 90* shoulder flexion with min compensations and pain 3/10 or under for mid level light reaching    Baseline  approx 70* w/ pain 7-10/10    Time  7    Period  Weeks    Status  New      OT LONG TERM GOAL #3   Title  Pt to improve coordination Lt hand as demo by decreasing speed on 9 hole peg test to 50 sec. or less     Baseline  68.41 sec    Time  7    Period  Weeks    Status  New      OT LONG TERM GOAL #4   Title  Improve grip strength Lt hand to 35 lbs or greater    Baseline  24 lbs (Rt = 87 lbs)    Time  7    Period  Weeks    Status  New            Plan - 04/16/18 1446    Clinical Impression Statement  Pt is a 57 y.o. male with recent CVA on 11/20/17 and Lt non dominant hemiparesis. However, pt has had mulitple small Rt cerebral watershed infarcts at various ages and chronic bilateral BG and pontine  infarcts. Pt reports LUE has increasingly gotten worse prior to June 2019. Pt now with decreased ROM, increased shoulder pain, decreased coordination and overall decreased function LUE. Pt also with mild cognitive deficits from multiple infarcts. (See P.T. note for details on LT LE deficits and balance deficits)     Occupational Profile and client history currently impacting functional performance  PMH: Multiple watershed infarcts, HTN,  HLD, polysubstance abuse    Occupational performance deficits (Please refer to evaluation for details):  ADL's;IADL's;Rest and Sleep;Social Participation;Leisure    Rehab Potential  Good    Current Impairments/barriers affecting progress:  severity of pain Lt shoulder (possibly orthopedic problems from stroke)     OT Frequency  2x / week    OT Duration  --   for 7 weeks (plus evaluation)    OT Treatment/Interventions  Self-care/ADL training;Moist Heat;DME and/or AE instruction;Splinting;Aquatic Therapy;Therapeutic activities;Therapeutic exercise;Cognitive remediation/compensation;Neuromuscular education;Passive range of motion;Patient/family education;Electrical Stimulation;Manual Therapy    Plan  low range stretches, supine stretches and AA/ROM, sidelying scapula mobility all to address Lt shoulder pain/ROM    Clinical Decision Making  Several treatment options, min-mod task modification necessary       Patient will benefit from skilled therapeutic intervention in order to improve the following deficits and impairments:  Decreased coordination, Decreased range of motion, Improper body mechanics, Decreased coping skills, Decreased safety awareness, Decreased knowledge of precautions, Impaired tone, Decreased balance, Decreased knowledge of use of DME, Impaired UE functional use, Pain, Decreased cognition, Decreased mobility, Decreased strength, Impaired perceived functional ability, Impaired vision/preception  Visit Diagnosis: Hemiplegia and hemiparesis following  cerebral infarction affecting left non-dominant side (HCC) - Plan: Ot plan of care cert/re-cert  Other lack of coordination - Plan: Ot plan of care cert/re-cert  Muscle weakness (generalized) - Plan: Ot plan of care cert/re-cert  Unsteadiness on feet - Plan: Ot plan of care cert/re-cert  Acute pain of left shoulder - Plan: Ot plan of care cert/re-cert    Problem List Patient Active Problem List   Diagnosis Date Noted  . Carotid artery disease (HCC) 11/25/2017  . Carotid stenosis 11/24/2017  . Hyperlipidemia 11/21/2017  . Marijuana use 11/21/2017  . Acute bilat watershed infarction [facility]) 11/20/2017  . Dyslipidemia 11/20/2017  . Hypertension 11/20/2017  . Polysubstance abuse (HCC) 11/20/2017  . HLD (hyperlipidemia) 05/20/2014  . Osteoarthritis of right knee 11/27/2013  . Establishing care with new doctor, encounter for 04/05/2013  . Poor vision 04/05/2013  . High blood pressure 04/05/2013  . Knee pain, acute 04/05/2013    Kelli Churn, OTR/L 04/16/2018, 3:11 PM  [facility] [address] [place], [place], [address] Phone: [phone]   Fax:  [phone]  Name: Nathan Hernandez MRN: [id number] Date of Birth: [birth year]

## 2018-04-18 ENCOUNTER — Ambulatory Visit (INDEPENDENT_AMBULATORY_CARE_PROVIDER_SITE_OTHER): Payer: Medicaid Other | Admitting: Orthopedic Surgery

## 2018-04-18 ENCOUNTER — Encounter: Payer: Self-pay | Admitting: Physical Therapy

## 2018-04-18 ENCOUNTER — Encounter (INDEPENDENT_AMBULATORY_CARE_PROVIDER_SITE_OTHER): Payer: Self-pay | Admitting: Orthopedic Surgery

## 2018-04-18 ENCOUNTER — Ambulatory Visit: Payer: Medicaid Other | Admitting: Physical Therapy

## 2018-04-18 DIAGNOSIS — I69354 Hemiplegia and hemiparesis following cerebral infarction affecting left non-dominant side: Secondary | ICD-10-CM | POA: Diagnosis not present

## 2018-04-18 DIAGNOSIS — M25512 Pain in left shoulder: Secondary | ICD-10-CM | POA: Diagnosis not present

## 2018-04-18 DIAGNOSIS — M7502 Adhesive capsulitis of left shoulder: Secondary | ICD-10-CM | POA: Diagnosis not present

## 2018-04-18 DIAGNOSIS — M6281 Muscle weakness (generalized): Secondary | ICD-10-CM

## 2018-04-18 DIAGNOSIS — R2681 Unsteadiness on feet: Secondary | ICD-10-CM | POA: Diagnosis not present

## 2018-04-18 DIAGNOSIS — R278 Other lack of coordination: Secondary | ICD-10-CM | POA: Diagnosis not present

## 2018-04-18 DIAGNOSIS — R2689 Other abnormalities of gait and mobility: Secondary | ICD-10-CM | POA: Diagnosis not present

## 2018-04-18 NOTE — Therapy (Signed)
[facility] [address] [place], [place], [address] Phone: [phone]   Fax:  [phone]  Physical Therapy Treatment  Patient Details  Name: Nathan Hernandez MRN: [id number] Date of Birth: 10-22-1960 Referring Provider (PT): George Hugh, NP   Encounter Date: 04/18/2018  PT End of Session - 04/18/18 1103    Visit Number  2    Number of Visits  4    Date for PT Re-Evaluation  05/04/18    Authorization Type  Medicaid    Authorization Time Period  04/18/18 - 05/08/18    Authorization - Visit Number  1    Authorization - Number of Visits  3    PT Start Time  1105    PT Stop Time  1145    PT Time Calculation (min)  40 min    Behavior During Therapy  [facility] for tasks assessed/performed       Past Medical History:  Diagnosis Date  . HLD (hyperlipidemia)   . Hypertension   . Osteoarthritis   . Polysubstance abuse (HCC)   . Stroke [facility])     Past Surgical History:  Procedure Laterality Date  . GROIN DEBRIDEMENT Left 11/24/2017   Procedure: EVACUATION OF LEFT GROIN HEMATOMA, REPAIR OF LEFT FEMORAL ARTERY BRACH;  Surgeon: Larina Earthly, MD;  Location: [facility] OR;  Service: Vascular;  Laterality: Left;  . KNEE SURGERY    . TRANSCAROTID ARTERY REVASCULARIZATION (TCAR)  11/24/2017  . TRANSCAROTID ARTERY REVASCULARIZATION Right 11/24/2017   Procedure: TRANSCAROTID ARTERY REVASCULARIZATION;  Surgeon: Nada Libman, MD;  Location: [facility] OR;  Service: Vascular;  Laterality: Right;    There were no vitals filed for this visit.  Subjective Assessment - 04/18/18 1104    Subjective  Pt reports he is feeling well and reports no change in status. Reports no recent falls or stumbles. Continues to experience L UE & LE weakness. Reports going to orthopedic at 3 PM today.     Pertinent History  HTN, B watershed infarction, HLD, polysubstance abuse    Limitations  Walking    How long can you walk comfortably?  20-30 min    Patient  Stated Goals  reduce reliance on cane    Currently in Pain?  Yes    Pain Score  7     Pain Location  Arm    Pain Orientation  Left    Pain Descriptors / Indicators  Pounding    Pain Type  Acute pain    Pain Onset  More than a month ago    Pain Frequency  Intermittent    Aggravating Factors   certain movements     Pain Relieving Factors  rest                       [facility] Adult PT Treatment/Exercise - 04/18/18 1117      Exercises   Exercises  Knee/Hip      Knee/Hip Exercises: Stretches   Passive Hamstring Stretch  Left;3 reps;30 seconds    Passive Hamstring Stretch Limitations  seated with LE extended on stool with overpressure from belt    Hip Flexor Stretch  Left;2 reps;30 seconds    Hip Flexor Stretch Limitations  supine with LE off edge of mat table    Gastroc Stretch  Left;2 reps;30 seconds    Gastroc Stretch Limitations  at counter for upright support      Knee/Hip Exercises: Standing   Wall Squat  5 reps  Wall Squat Limitations  focus on equal weight shifting; progression to wall squat + R heel lift x 5 reps    Other Standing Knee Exercises  side stepping at counter with band at ankles - focus on slight squat and weight shift      Knee/Hip Exercises: Seated   Sit to Sand  10 reps;without UE support   R LE slightly anterior for L weight shift     Knee/Hip Exercises: Supine   Bridges  Strengthening;Both;2 sets;10 reps    Bridges Limitations  blue tband at knees    Single Leg Bridge  --   unable                 PT Long Term Goals - 04/06/18 1316      PT LONG TERM GOAL #1   Title  patient to be independent with HEP for LE strength and balance     Baseline  will initiate at 1st treatment session    Time  3    Period  Weeks    Status  New    Target Date  05/04/18      PT LONG TERM GOAL #2   Title  Patient to improve FGA by >/= 4 points     Baseline  04/06/18: 11/30    Time  3    Period  Weeks    Status  New    Target Date  05/04/18       PT LONG TERM GOAL #3   Title  Patient to demonstrate appropriate gait mechanics with SPC demonstrating improved functional mobility with LRAD    Time  3    Period  Weeks    Status  New    Target Date  05/04/18      PT LONG TERM GOAL #4   Title  Patient to improve 5x sit to stand by >/= 5 seconds for improved balance and mobility    Baseline  04/06/18: 21.35 seconds    Time  3    Period  Weeks    Status  New    Target Date  05/04/18      PT LONG TERM GOAL #5   Title  patient to improve gait speed by >/= 0.5 ft/sec    Baseline  04/06/18: 2.5 ft/sec with [facility]    Time  3    Period  Weeks    Status  New    Target Date  05/04/18            Plan - 04/18/18 1113    Clinical Impression Statement  Skilled session today focusing on initiating HEP for balance and strength. With bridge activities patient reporting pain at L knee limiting tolerance with full weight bearing. HEP handout for gentle strengthening, stretching and balance activities with good tolerance and understanding of tasks. Patient protective of L UE during session with reports of appointment for ortho MD later this date. Will continue to progress towards goals.     Rehab Potential  Good    PT Frequency  1x / week    PT Duration  3 weeks    PT Treatment/Interventions  ADLs/Self Care Home Management;Electrical Stimulation;Moist Heat;Therapeutic exercise;Therapeutic activities;Functional mobility training;Stair training;Gait training;DME Instruction;Balance training;Neuromuscular re-education;Patient/family education;Manual techniques;Vasopneumatic Device;Taping;Passive range of motion    PT Next Visit Plan  continue high level balance and strength    Consulted and Agree with Plan of Care  Patient       Patient will benefit from skilled therapeutic intervention  in order to improve the following deficits and impairments:  Abnormal gait, Decreased activity tolerance, Decreased balance, Decreased coordination, Decreased  mobility, Difficulty walking, Decreased strength, Decreased endurance, Pain  Visit Diagnosis: Muscle weakness (generalized)  Unsteadiness on feet     Problem List Patient Active Problem List   Diagnosis Date Noted  . Carotid artery disease (HCC) 11/25/2017  . Carotid stenosis 11/24/2017  . Hyperlipidemia 11/21/2017  . Marijuana use 11/21/2017  . Acute bilat watershed infarction [facility](HCC) 11/20/2017  . Dyslipidemia 11/20/2017  . Hypertension 11/20/2017  . Polysubstance abuse (HCC) 11/20/2017  . HLD (hyperlipidemia) 05/20/2014  . Osteoarthritis of right knee 11/27/2013  . Establishing care with new doctor, encounter for 04/05/2013  . Poor vision 04/05/2013  . High blood pressure 04/05/2013  . Knee pain, acute 04/05/2013    Kipp LaurenceStephanie R Stellarose Cerny, PT, DPT Supplemental Physical Therapist 04/18/18 11:46 AM Pager: [phone] Office: [phone]  [facility] [address] [place], [place], [address] Phone: [phone]   Fax:  [phone]  Name: Gerline LegacyMichael Vandiver MRN: [id number] Date of Birth: 03/14/1961

## 2018-04-18 NOTE — Progress Notes (Signed)
Office Visit Note   Patient: Nathan Hernandez           Date of Birth: 02-Jul-1960           MRN: [id number] Visit Date: 04/18/2018 Requested by: Loletta Specter, PA-C [address] [place], [place] [address] PCP: Denny Levy  Subjective: Chief Complaint  Patient presents with  . Left Shoulder - Pain    HPI: Jerone's patient with left shoulder pain.'s been going on for several months.  Did have a stroke which affected his left side which occurred in the summer.  He is been ambulating with a cane.  Describes decreased range of motion in the left shoulder.  States his whole arm is weak from the stroke.  Uses Aleve and over-the-counter medication for the problem.  The pain does wake him from sleep at times.  He is currently not working.  He cannot sleep on the left side.              ROS: All systems reviewed are negative as they relate to the chief complaint within the history of present illness.  Patient denies  fevers or chills.   Assessment & Plan: Visit Diagnoses:  1. Left shoulder pain, unspecified chronicity   2. Adhesive capsulitis of left shoulder     Plan: Impression is left frozen shoulder.  Plan is intra-articular shoulder joint injection with physical therapy for active and passive range of motion of that left shoulder.  I think he would do well to go with initial nonoperative treatment plan.  We will see how he does with that intervention.  Could consider another injection in 6 weeks if he is not getting any improvement.  Follow-up as needed  Follow-Up Instructions: No follow-ups on file.   Orders:  Orders Placed This Encounter  Procedures  . Ambulatory referral to Physical Medicine Rehab  . Ambulatory referral to Physical Therapy   No orders of the defined types were placed in this encounter.     Procedures: No procedures performed   Clinical Data: No additional findings.  Objective: Vital Signs: There were no vitals taken for this  visit.  Physical Exam:   Constitutional: Patient appears well-developed HEENT:  Head: Normocephalic Eyes:EOM are normal Neck: Normal range of motion Cardiovascular: Normal rate Pulmonary/chest: Effort normal Neurologic: Patient is alert Skin: Skin is warm Psychiatric: Patient has normal mood and affect    Ortho Exam: Ortho exam demonstrates good cervical spine range of motion.  He actually has fairly symmetric grip EPL FPL interosseous wrist flexion extension bicep triceps and deltoid strength.  Radial pulses intact.  He does have limited external rotation on the left compared to the right.  On the right he is about 80 degrees on the left is about 20.  Rotator cuff strength is intact.  No other masses lymph adenopathy or skin changes noted in that left shoulder girdle region.  He also has limited forward flexion and abduction compared to the right-hand side.  Abduction is about 70 forward flexion is right around 90  Specialty Comments:  No specialty comments available.  Imaging: No results found.   PMFS History: Patient Active Problem List   Diagnosis Date Noted  . Carotid artery disease (HCC) 11/25/2017  . Carotid stenosis 11/24/2017  . Hyperlipidemia 11/21/2017  . Marijuana use 11/21/2017  . Acute bilat watershed infarction [facility]) 11/20/2017  . Dyslipidemia 11/20/2017  . Hypertension 11/20/2017  . Polysubstance abuse (HCC) 11/20/2017  . HLD (hyperlipidemia) 05/20/2014  .  Osteoarthritis of right knee 11/27/2013  . Establishing care with new doctor, encounter for 04/05/2013  . Poor vision 04/05/2013  . High blood pressure 04/05/2013  . Knee pain, acute 04/05/2013   Past Medical History:  Diagnosis Date  . HLD (hyperlipidemia)   . Hypertension   . Osteoarthritis   . Polysubstance abuse (HCC)   . Stroke [facility](HCC)     Family History  Problem Relation Age of Onset  . Diabetes Mother   . Hypertension Mother   . Hypertension Father   . Diabetes Father     Past Surgical  History:  Procedure Laterality Date  . GROIN DEBRIDEMENT Left 11/24/2017   Procedure: EVACUATION OF LEFT GROIN HEMATOMA, REPAIR OF LEFT FEMORAL ARTERY BRACH;  Surgeon: Larina EarthlyEarly, Todd F, MD;  [facility] OR;  Service: Vascular;  Laterality: Left;  . KNEE SURGERY    . TRANSCAROTID ARTERY REVASCULARIZATION (TCAR)  11/24/2017  . TRANSCAROTID ARTERY REVASCULARIZATION Right 11/24/2017   Procedure: TRANSCAROTID ARTERY REVASCULARIZATION;  Surgeon: Nada LibmanBrabham, Vance W, MD;  [facility];  Service: Vascular;  Laterality: Right;   Social History   Occupational History  . Not on file  Tobacco Use  . Smoking status: Former Smoker    Packs/day: 1.00    Types: Cigars    Last attempt to quit: 11/2017    Years since quitting: 0.4  . Smokeless tobacco: Never Used  Substance and Sexual Activity  . Alcohol use: Yes    Comment: 2 to 3 per month  . Drug use: Yes    Frequency: 7.0 times per week    Types: Marijuana    Comment: every once in a while  . Sexual activity: Not on file

## 2018-04-18 NOTE — Patient Instructions (Signed)
Access Code: [email or web address]  URL: [email or web address]  Date: 04/18/2018  Prepared by: Lavina HammanStephanie Ayerim Berquist   Exercises  Bridge with Resistance - 10 reps - 2 sets - 1x daily - 7x weekly  Supine Hip Flexor Stretch with Weight - 3 reps - 1 sets - 30 hold - 1x daily - 7x weekly  Seated Hamstring Stretch with Chair - 3 reps - 1 sets - 30 hold - 1x daily - 7x weekly  Sit to Stand with Arms Crossed - 10 reps - 2 sets - 1x daily - 7x weekly  Standing Gastroc Stretch at Counter - 3 reps - 1 sets - 30 hold - 1x daily - 7x weekly  Wall Squat - 10 reps - 2 sets - 5 hold - 1x daily - 7x weekly  Side Stepping with Resistance at Ankles - 10 reps - 2 sets - 1x daily - 7x weekly

## 2018-04-23 ENCOUNTER — Ambulatory Visit: Payer: Medicaid Other | Admitting: Occupational Therapy

## 2018-04-24 ENCOUNTER — Encounter: Payer: Self-pay | Admitting: Occupational Therapy

## 2018-04-24 ENCOUNTER — Ambulatory Visit: Payer: Medicaid Other | Admitting: Occupational Therapy

## 2018-04-24 DIAGNOSIS — M25512 Pain in left shoulder: Secondary | ICD-10-CM

## 2018-04-24 DIAGNOSIS — R2681 Unsteadiness on feet: Secondary | ICD-10-CM | POA: Diagnosis not present

## 2018-04-24 DIAGNOSIS — M6281 Muscle weakness (generalized): Secondary | ICD-10-CM | POA: Diagnosis not present

## 2018-04-24 DIAGNOSIS — R278 Other lack of coordination: Secondary | ICD-10-CM | POA: Diagnosis not present

## 2018-04-24 DIAGNOSIS — I69354 Hemiplegia and hemiparesis following cerebral infarction affecting left non-dominant side: Secondary | ICD-10-CM | POA: Diagnosis not present

## 2018-04-24 DIAGNOSIS — R2689 Other abnormalities of gait and mobility: Secondary | ICD-10-CM | POA: Diagnosis not present

## 2018-04-24 NOTE — Therapy (Signed)
[facility] [address] [place], [place], [address] Phone: [phone]   Fax:  [phone]  Occupational Therapy Treatment  Patient Details  Name: Nathan Hernandez MRN: [id number] Date of Birth: [birth year] Referring Provider (OT): George Hugh   Encounter Date: 04/24/2018  OT End of Session - 04/24/18 2143    Visit Number  2    Number of Visits  15    Date for OT Re-Evaluation  06/05/18    Authorization Type  MCD--8 OT visits approved    Authorization Time Period  11/18-12/15/19    Authorization - Visit Number  1    Authorization - Number of Visits  8    OT Start Time  0835    OT Stop Time  0918    OT Time Calculation (min)  43 min    Activity Tolerance  Patient limited by pain    Behavior During Therapy  [facility] for tasks assessed/performed       Past Medical History:  Diagnosis Date  . HLD (hyperlipidemia)   . Hypertension   . Osteoarthritis   . Polysubstance abuse (HCC)   . Stroke [facility])     Past Surgical History:  Procedure Laterality Date  . GROIN DEBRIDEMENT Left 11/24/2017   Procedure: EVACUATION OF LEFT GROIN HEMATOMA, REPAIR OF LEFT FEMORAL ARTERY BRACH;  Surgeon: Larina Earthly, MD;  Location: [facility] OR;  Service: Vascular;  Laterality: Left;  . KNEE SURGERY    . TRANSCAROTID ARTERY REVASCULARIZATION (TCAR)  11/24/2017  . TRANSCAROTID ARTERY REVASCULARIZATION Right 11/24/2017   Procedure: TRANSCAROTID ARTERY REVASCULARIZATION;  Surgeon: Nada Libman, MD;  Location: [facility] OR;  Service: Vascular;  Laterality: Right;    There were no vitals filed for this visit.  Subjective Assessment - 04/24/18 0837    Subjective   Supposed to get a shot 11/25 (Monday) in L shoulder for pain by Ortho MD (?Dr. August Saucer).  Shoulder hurts at night    Pertinent History  CVA 11/20/17. PMH: multiple small Rt cerebral watershed infarcts, and bilateral BG and pontine infarcts, HTN, HLD, polysubstance abuse    Currently in  Pain?  Yes    Pain Score  --   none during session with proper positioning   Pain Location  Arm    Pain Orientation  Left    Pain Descriptors / Indicators  Throbbing;Sharp    Pain Type  Acute pain    Pain Onset  More than a month ago    Aggravating Factors   abduction/adduction, improper positioning    Pain Relieving Factors  rest, proper positioning        Manual:  In supine, gentle joint mobs/soft tissue mobs to L shoulder.  Sidelying, with LUE positioned along pt's side, gentle scapular mobs/soft tissue mobs. PROM shoulder flex and circumduction.  Self-care:  Attempted to address bed positioning.  Pt unable to tolerate sideliying with LUE on pillows today.  Pt does report no pain with LUE supported in supine, but reports that he cannot fall asleep on back.  Will continue to monitor and attempt proper bed positioning on R side when able.       OT Education - 04/24/18 2148    Education Details  Initiated proper positioning of LUE with focus on scapular retraction/depression with movement and good posture; initial HEP--see pt instructions    Person(s) Educated  Patient    Methods  Explanation;Demonstration;Verbal cues;Handout;Tactile cues    Comprehension  Verbalized understanding;Returned demonstration;Need further instruction;Verbal cues required  OT Short Term Goals - 04/16/18 1454      OT SHORT TERM GOAL #1   Title  Independent with initial HEP for LUE - 05/15/18    Baseline  dependent    Time  4    Period  Weeks    Status  New      OT SHORT TERM GOAL #2   Title  Pt able to achieve LUE shoulder flexion to 60* w/ pain 3/10 or under for low range functional reaching using only min compensations    Baseline  7/10 - 10/10 pain w/ max compensations at 70*    Time  4    Period  Weeks    Status  New      OT SHORT TERM GOAL #3   Title  Pt to improve coordination Lt hand as evidenced by reducing speed on 9 hole peg test to 60 sec. or less     Baseline  68.41 sec.      Time  4    Period  Weeks    Status  New      OT SHORT TERM GOAL #4   Title  Pt to perform low level lateral reaching LUE to 50* w/ pain less than or equal to 3/10    Baseline  10/10 pain    Time  4    Period  Weeks    Status  New        OT Long Term Goals - 04/16/18 1500      OT LONG TERM GOAL #1   Title  Independent with updated HEP for LUE    Baseline  Dependent    Time  7    Period  Weeks    Status  New      OT LONG TERM GOAL #2   Title  Pt to demo 90* shoulder flexion with min compensations and pain 3/10 or under for mid level light reaching    Baseline  approx 70* w/ pain 7-10/10    Time  7    Period  Weeks    Status  New      OT LONG TERM GOAL #3   Title  Pt to improve coordination Lt hand as demo by decreasing speed on 9 hole peg test to 50 sec. or less     Baseline  68.41 sec    Time  7    Period  Weeks    Status  New      OT LONG TERM GOAL #4   Title  Improve grip strength Lt hand to 35 lbs or greater    Baseline  24 lbs (Rt = 87 lbs)    Time  7    Period  Weeks    Status  New            Plan - 04/24/18 2144    Clinical Impression Statement  Pt demo good response during session with no pain with techniques for proper positioning/alignment of L shoulder.  Pt does report more pain at night and was unable to tolerate sidelying with LUE on pillow today, but able to do so with LUE positioned along side.    Occupational Profile and client history currently impacting functional performance  PMH: Multiple watershed infarcts, HTN, HLD, polysubstance abuse    Occupational performance deficits (Please refer to evaluation for details):  ADL's;IADL's;Rest and Sleep;Social Participation;Leisure    Rehab Potential  Good    Current Impairments/barriers affecting progress:  severity of pain Lt  shoulder (possibly orthopedic problems from stroke)     OT Frequency  2x / week    OT Duration  --   for 7 weeks (plus evaluation)    OT Treatment/Interventions   Self-care/ADL training;Moist Heat;DME and/or AE instruction;Splinting;Aquatic Therapy;Therapeutic activities;Therapeutic exercise;Cognitive remediation/compensation;Neuromuscular education;Passive range of motion;Patient/family education;Electrical Stimulation;Manual Therapy    Plan  positioning of LUE in bed, manual therapy/neuro re-ed to address Lt shoulder pain/ROM; review HEP    Clinical Decision Making  Several treatment options, min-mod task modification necessary    Consulted and Agree with Plan of Care  Patient       Patient will benefit from skilled therapeutic intervention in order to improve the following deficits and impairments:  Decreased coordination, Decreased range of motion, Improper body mechanics, Decreased coping skills, Decreased safety awareness, Decreased knowledge of precautions, Impaired tone, Decreased balance, Decreased knowledge of use of DME, Impaired UE functional use, Pain, Decreased cognition, Decreased mobility, Decreased strength, Impaired perceived functional ability, Impaired vision/preception  Visit Diagnosis: Hemiplegia and hemiparesis following cerebral infarction affecting left non-dominant side (HCC)  Other lack of coordination  Acute pain of left shoulder    Problem List Patient Active Problem List   Diagnosis Date Noted  . Carotid artery disease (HCC) 11/25/2017  . Carotid stenosis 11/24/2017  . Hyperlipidemia 11/21/2017  . Marijuana use 11/21/2017  . Acute bilat watershed infarction [facility](HCC) 11/20/2017  . Dyslipidemia 11/20/2017  . Hypertension 11/20/2017  . Polysubstance abuse (HCC) 11/20/2017  . HLD (hyperlipidemia) 05/20/2014  . Osteoarthritis of right knee 11/27/2013  . Establishing care with new doctor, encounter for 04/05/2013  . Poor vision 04/05/2013  . High blood pressure 04/05/2013  . Knee pain, acute 04/05/2013    [facility] HospitalFREEMAN,Marquest Gunkel 04/24/2018, 9:50 PM  [facility] [address] [place], [place], [address] Phone: [phone]   Fax:  [phone]  Name: Gerline LegacyMichael Duthie MRN: [id number] Date of Birth: 11-May-1961

## 2018-04-24 NOTE — Patient Instructions (Signed)
   Lay down.  Hold a ball/shoe box/pillow (shoulder width sized) on tops of legs with arms straight and hands flat on either side. Slowly move arms up/back in an arc with elbows straight and then slowly lower back down. Keep shoulders away from ears and shoulder blades back on bed.  Move only in pain-free range. Repeat 10 times.    Rest, then do 10 more, 2x/day      Lay down.  Hold a ball/shoe box/pillow (shoulder width sized)  with arms straight and hands flat on either side. Start with ball on chest, then straighten elbows to push ball up to the ceiling hold 3sec, then slowly lower by keeping elbows close to your side. Keep shoulders away from ears and shoulder blades back.   Move only in pain-free range. Repeat 10 times.  Rest, then do 10 more, 2x/day   SELF ASSISTED WITH OBJECT: Shoulder Flexion / Elbow Extension (Crutch)    Place one hand on crutch or other assistive device. Move arm forward, straighten elbow. 10 reps per set, 2 sets per day.  Move only in pain-free range.  Keep shoulder blade back, sit up tall.    Clasp hands together.  Sit and lean forward with both arms like you are reaching for the floor.  Hold 5-10sec.  Repeat 10 times, 2x/day.  Move only in pain-free range.

## 2018-04-25 ENCOUNTER — Encounter: Payer: Self-pay | Admitting: Physical Therapy

## 2018-04-25 ENCOUNTER — Ambulatory Visit: Payer: Medicaid Other | Admitting: Physical Therapy

## 2018-04-25 DIAGNOSIS — M6281 Muscle weakness (generalized): Secondary | ICD-10-CM

## 2018-04-25 DIAGNOSIS — M25512 Pain in left shoulder: Secondary | ICD-10-CM | POA: Diagnosis not present

## 2018-04-25 DIAGNOSIS — R2689 Other abnormalities of gait and mobility: Secondary | ICD-10-CM

## 2018-04-25 DIAGNOSIS — R2681 Unsteadiness on feet: Secondary | ICD-10-CM

## 2018-04-25 DIAGNOSIS — R278 Other lack of coordination: Secondary | ICD-10-CM | POA: Diagnosis not present

## 2018-04-25 DIAGNOSIS — I69354 Hemiplegia and hemiparesis following cerebral infarction affecting left non-dominant side: Secondary | ICD-10-CM | POA: Diagnosis not present

## 2018-04-25 NOTE — Therapy (Addendum)
[facility] [address] [place], [place], [address] Phone: [phone]   Fax:  [phone]  Physical Therapy Treatment  Patient Details  Name: Nathan Hernandez MRN: [id number] Date of Birth: 1961/01/24 Referring Provider (PT): George HughJessica Vanschaick, NP   Encounter Date: 04/25/2018  PT End of Session - 04/25/18 1155    Visit Number  3    Number of Visits  4    Date for PT Re-Evaluation  05/04/18    Authorization Type  Medicaid    Authorization Time Period  04/18/18 - 05/08/18    Authorization - Visit Number  2    Authorization - Number of Visits  3    PT Start Time  1150    PT Stop Time  1232    PT Time Calculation (min)  42 min    Activity Tolerance  Patient tolerated treatment well    Behavior During Therapy  [facility] for tasks assessed/performed       Past Medical History:  Diagnosis Date  . HLD (hyperlipidemia)   . Hypertension   . Osteoarthritis   . Polysubstance abuse (HCC)   . Stroke [facility](HCC)     Past Surgical History:  Procedure Laterality Date  . GROIN DEBRIDEMENT Left 11/24/2017   Procedure: EVACUATION OF LEFT GROIN HEMATOMA, REPAIR OF LEFT FEMORAL ARTERY BRACH;  Surgeon: Larina EarthlyEarly, Todd F, MD;  Location: [facility] OR;  Service: Vascular;  Laterality: Left;  . KNEE SURGERY    . TRANSCAROTID ARTERY REVASCULARIZATION (TCAR)  11/24/2017  . TRANSCAROTID ARTERY REVASCULARIZATION Right 11/24/2017   Procedure: TRANSCAROTID ARTERY REVASCULARIZATION;  Surgeon: Nada LibmanBrabham, Vance W, MD;  [facility];  Service: Vascular;  Laterality: Right;    There were no vitals filed for this visit.  Subjective Assessment - 04/25/18 1155    Subjective  seen by MD - thinking frozen shoulder - will be getting an injection on Monday    Pertinent History  HTN, B watershed infarction, HLD, polysubstance abuse    Patient Stated Goals  reduce reliance on cane    Currently in Pain?  No/denies    Pain Score  0-No pain                        [facility] Adult PT Treatment/Exercise - 04/25/18 0001      Ambulation/Gait   Ambulation/Gait  Yes    Ambulation/Gait Assistance  5: Supervision    Ambulation/Gait Assistance Details  close supervision without AD; slow pace of gait with incorporated head turns and stepping over objects    Ambulation Distance (Feet)  200 Feet    Assistive device  None    Gait Pattern  Step-through pattern;Decreased stance time - left;Decreased step length - right;Decreased hip/knee flexion - left;Decreased weight shift to left    Ambulation Surface  Level;Indoor      Exercises   Exercises  Knee/Hip;Other Exercises    Other Exercises   seated on blue physioball: alternating LAQ, alternating marches, scap retraction x 15; STS x 10 no UE support      Knee/Hip Exercises: Aerobic   Nustep  L5 x 5 min - target steps/min >50 for dynamic warm up          Balance Exercises - 04/25/18 1320      Balance Exercises: Standing   Standing Eyes Opened  Narrow base of support (BOS);Head turns;Foam/compliant surface;Solid surface;2 reps;30 secs   solid static, head turns, head nods, foam in progression   Standing Eyes Closed  Narrow base of support (BOS);Solid surface;2 reps;30 secs    Tandem Stance  Eyes open;2 reps;30 secs   static, head turns in progression   Gait with Head Turns  Forward   no AD ~ 100 ft x 2   Tandem Gait  Forward;4 reps;Intermittent upper extremity support   at counter   Retro Gait  Upper extremity support;4 reps   at counter   Step Over Hurdles / Cones  stepping over 4 hurdles x 5 with goal of reciprocal stepping without stopping        PT Education - 04/25/18 1328    Education Details  HEP update with balance activities and how to safely perform at home    Person(s) Educated  Patient    Methods  Explanation;Demonstration;Handout    Comprehension  Verbalized understanding          PT Long Term Goals - 04/06/18 1316      PT LONG TERM GOAL #1    Title  patient to be independent with HEP for LE strength and balance     Baseline  will initiate at 1st treatment session    Time  3    Period  Weeks    Status  New    Target Date  05/04/18      PT LONG TERM GOAL #2   Title  Patient to improve FGA by >/= 4 points     Baseline  04/06/18: 11/30    Time  3    Period  Weeks    Status  New    Target Date  05/04/18      PT LONG TERM GOAL #3   Title  Patient to demonstrate appropriate gait mechanics with SPC demonstrating improved functional mobility with LRAD    Time  3    Period  Weeks    Status  New    Target Date  05/04/18      PT LONG TERM GOAL #4   Title  Patient to improve 5x sit to stand by >/= 5 seconds for improved balance and mobility    Baseline  04/06/18: 21.35 seconds    Time  3    Period  Weeks    Status  New    Target Date  05/04/18      PT LONG TERM GOAL #5   Title  patient to improve gait speed by >/= 0.5 ft/sec    Baseline  04/06/18: 2.5 ft/sec with [facility]    Time  3    Period  Weeks    Status  New    Target Date  05/04/18            Plan - 04/25/18 1156    Clinical Impression Statement  Session today focusing on balance activities with HEP handout for activities that patient can safely perform at home. TIme also spent on gait without AD as patient has a goal of reducing reliance on cane. Does require consistent cueing for posture and forward gaze as patient prefers downward. Will plan to asses goals at next visit to apply for additional visits from insurance at 2x/week.     Rehab Potential  Good    PT Frequency  1x / week    PT Duration  3 weeks    PT Treatment/Interventions  ADLs/Self Care Home Management;Electrical Stimulation;Moist Heat;Therapeutic exercise;Therapeutic activities;Functional mobility training;Stair training;Gait training;DME Instruction;Balance training;Neuromuscular re-education;Patient/family education;Manual techniques;Vasopneumatic Device;Taping;Passive range of motion    PT Next  Visit Plan  continue high level balance  and strength    PT Home Exercise Plan  [id number]    Consulted and Agree with Plan of Care  Patient       Patient will benefit from skilled therapeutic intervention in order to improve the following deficits and impairments:  Abnormal gait, Decreased activity tolerance, Decreased balance, Decreased coordination, Decreased mobility, Difficulty walking, Decreased strength, Decreased endurance, Pain  Visit Diagnosis: Unsteadiness on feet  Other abnormalities of gait and mobility  Muscle weakness (generalized)     Problem List Patient Active Problem List   Diagnosis Date Noted  . Carotid artery disease (HCC) 11/25/2017  . Carotid stenosis 11/24/2017  . Hyperlipidemia 11/21/2017  . Marijuana use 11/21/2017  . Acute bilat watershed infarction [facility]) 11/20/2017  . Dyslipidemia 11/20/2017  . Hypertension 11/20/2017  . Polysubstance abuse (HCC) 11/20/2017  . HLD (hyperlipidemia) 05/20/2014  . Osteoarthritis of right knee 11/27/2013  . Establishing care with new doctor, encounter for 04/05/2013  . Poor vision 04/05/2013  . High blood pressure 04/05/2013  . Knee pain, acute 04/05/2013    Kipp Laurence, PT, DPT Supplemental Physical Therapist 04/25/18 1:31 PM Pager: [phone] Office: [phone]  [facility] [address] [place], [place], [address] Phone: [phone]   Fax:  [phone]  Name: Nathan Hernandez MRN: [id number] Date of Birth: [birth year]

## 2018-04-26 ENCOUNTER — Ambulatory Visit: Payer: Medicaid Other | Admitting: Occupational Therapy

## 2018-04-26 DIAGNOSIS — M25512 Pain in left shoulder: Secondary | ICD-10-CM | POA: Diagnosis not present

## 2018-04-26 DIAGNOSIS — R2689 Other abnormalities of gait and mobility: Secondary | ICD-10-CM | POA: Diagnosis not present

## 2018-04-26 DIAGNOSIS — M6281 Muscle weakness (generalized): Secondary | ICD-10-CM | POA: Diagnosis not present

## 2018-04-26 DIAGNOSIS — R2681 Unsteadiness on feet: Secondary | ICD-10-CM | POA: Diagnosis not present

## 2018-04-26 DIAGNOSIS — I69354 Hemiplegia and hemiparesis following cerebral infarction affecting left non-dominant side: Secondary | ICD-10-CM | POA: Diagnosis not present

## 2018-04-26 DIAGNOSIS — R278 Other lack of coordination: Secondary | ICD-10-CM | POA: Diagnosis not present

## 2018-04-26 NOTE — Therapy (Signed)
[facility] [address] [place], [place], [address] Phone: [phone]   Fax:  [phone]  Occupational Therapy Treatment  Patient Details  Name: Nathan Hernandez MRN: [id number] Date of Birth: 05-Jan-1961 Referring Provider (OT): George Hugh   Encounter Date: 04/26/2018  OT End of Session - 04/26/18 1634    Visit Number  3    Number of Visits  15    Date for OT Re-Evaluation  06/05/18    Authorization Type  MCD--8 OT visits approved    Authorization Time Period  11/18-12/15/19    Authorization - Visit Number  2    Authorization - Number of Visits  8    OT Start Time  1445    OT Stop Time  1535    OT Time Calculation (min)  50 min    Activity Tolerance  Patient limited by pain    Behavior During Therapy  [facility] for tasks assessed/performed       Past Medical History:  Diagnosis Date  . HLD (hyperlipidemia)   . Hypertension   . Osteoarthritis   . Polysubstance abuse (HCC)   . Stroke [facility])     Past Surgical History:  Procedure Laterality Date  . GROIN DEBRIDEMENT Left 11/24/2017   Procedure: EVACUATION OF LEFT GROIN HEMATOMA, REPAIR OF LEFT FEMORAL ARTERY BRACH;  Surgeon: Larina Earthly, MD;  Location: [facility] OR;  Service: Vascular;  Laterality: Left;  . KNEE SURGERY    . TRANSCAROTID ARTERY REVASCULARIZATION (TCAR)  11/24/2017  . TRANSCAROTID ARTERY REVASCULARIZATION Right 11/24/2017   Procedure: TRANSCAROTID ARTERY REVASCULARIZATION;  Surgeon: Nada Libman, MD;  Location: [facility] OR;  Service: Vascular;  Laterality: Right;    There were no vitals filed for this visit.  Subjective Assessment - 04/26/18 1453    Subjective   My arm still hurts. I got an xray of my shoulder but that really didn't tell anything. I get an injection to my shoulder on Monday   Pertinent History  CVA 11/20/17. PMH: multiple small Rt cerebral watershed infarcts, and bilateral BG and pontine infarcts, HTN, HLD, polysubstance abuse     Currently in Pain?  Yes    Pain Score  5    up to 10/10 at times   Pain Location  Shoulder    Pain Orientation  Left    Pain Descriptors / Indicators  Throbbing;Sharp    Pain Type  Acute pain    Pain Onset  More than a month ago    Pain Frequency  Intermittent    Aggravating Factors   abduction/adduction, improper positioning     Pain Relieving Factors  rest, proper positioning        Neuro Re-education LUE for proper facilitation of movement and activation of proper musculature to prevent pain. Pt very guarded and anticipates pain.  Pt able to perform low range bilateral and reciprocal reaching into gravity at first, then up to midrange in gravity elim planes.  Supine: focused on initiation of proper scapula movement into depression and upward rotation for shoulder flexion vs. Initiation of hiking shoulders - pt very guarded initially, but eventually tolerated passive and AA/ROM to 90* w/o pain, then would allow elbow to bend for sh extension d/t pain coming back down.  Also worked on small shoulder circumduction movements w/ sh at 90* flexion and elbow bent Standing leaning over counter (hips flexed at 90*): performed sh extension exercises for posterior sh girdle strengthening  OT Short Term Goals - 04/16/18 1454      OT SHORT TERM GOAL #1   Title  Independent with initial HEP for LUE - 05/15/18    Baseline  dependent    Time  4    Period  Weeks    Status  New      OT SHORT TERM GOAL #2   Title  Pt able to achieve LUE shoulder flexion to 60* w/ pain 3/10 or under for low range functional reaching using only min compensations    Baseline  7/10 - 10/10 pain w/ max compensations at 70*    Time  4    Period  Weeks    Status  New      OT SHORT TERM GOAL #3   Title  Pt to improve coordination Lt hand as evidenced by reducing speed on 9 hole peg test to 60 sec. or less     Baseline  68.41 sec.     Time  4    Period  Weeks    Status  New       OT SHORT TERM GOAL #4   Title  Pt to perform low level lateral reaching LUE to 50* w/ pain less than or equal to 3/10    Baseline  10/10 pain    Time  4    Period  Weeks    Status  New        OT Long Term Goals - 04/16/18 1500      OT LONG TERM GOAL #1   Title  Independent with updated HEP for LUE    Baseline  Dependent    Time  7    Period  Weeks    Status  New      OT LONG TERM GOAL #2   Title  Pt to demo 90* shoulder flexion with min compensations and pain 3/10 or under for mid level light reaching    Baseline  approx 70* w/ pain 7-10/10    Time  7    Period  Weeks    Status  New      OT LONG TERM GOAL #3   Title  Pt to improve coordination Lt hand as demo by decreasing speed on 9 hole peg test to 50 sec. or less     Baseline  68.41 sec    Time  7    Period  Weeks    Status  New      OT LONG TERM GOAL #4   Title  Improve grip strength Lt hand to 35 lbs or greater    Baseline  24 lbs (Rt = 87 lbs)    Time  7    Period  Weeks    Status  New            Plan - 04/26/18 1634    Clinical Impression Statement  Pt limited by pain Lt shoulder and guarding. Pt was able to achieve passive and AA/ROM in sh flexion supine to 90* with cues for downward depression and upward rotation of scapula first with minimal pain, but pt would sporadically have pain coming down back into extension and unsure if this is due to guarding or impingement.     Occupational Profile and client history currently impacting functional performance  PMH: Multiple watershed infarcts, HTN, HLD, polysubstance abuse    Occupational performance deficits (Please refer to evaluation for details):  ADL's;IADL's;Rest and Sleep;Social Participation;Leisure    Rehab Potential  Good  Current Impairments/barriers affecting progress:  severity of pain Lt shoulder (possibly orthopedic problems from stroke)     OT Frequency  2x / week    OT Duration  6 weeks    OT Treatment/Interventions  Self-care/ADL  training;Moist Heat;DME and/or AE instruction;Splinting;Aquatic Therapy;Therapeutic activities;Therapeutic exercise;Cognitive remediation/compensation;Neuromuscular education;Passive range of motion;Patient/family education;Electrical Stimulation;Manual Therapy    Plan  Pt to have shoulder injection Monday 11/25 before coming to O.T. on 11/26 - continue  NMR and address Lt shoulder pain    Consulted and Agree with Plan of Care  Patient       Patient will benefit from skilled therapeutic intervention in order to improve the following deficits and impairments:  Decreased coordination, Decreased range of motion, Improper body mechanics, Decreased coping skills, Decreased safety awareness, Decreased knowledge of precautions, Impaired tone, Decreased balance, Decreased knowledge of use of DME, Impaired UE functional use, Pain, Decreased cognition, Decreased mobility, Decreased strength, Impaired perceived functional ability, Impaired vision/preception  Visit Diagnosis: Hemiplegia and hemiparesis following cerebral infarction affecting left non-dominant side (HCC)  Acute pain of left shoulder    Problem List Patient Active Problem List   Diagnosis Date Noted  . Carotid artery disease (HCC) 11/25/2017  . Carotid stenosis 11/24/2017  . Hyperlipidemia 11/21/2017  . Marijuana use 11/21/2017  . Acute bilat watershed infarction [facility](HCC) 11/20/2017  . Dyslipidemia 11/20/2017  . Hypertension 11/20/2017  . Polysubstance abuse (HCC) 11/20/2017  . HLD (hyperlipidemia) 05/20/2014  . Osteoarthritis of right knee 11/27/2013  . Establishing care with new doctor, encounter for 04/05/2013  . Poor vision 04/05/2013  . High blood pressure 04/05/2013  . Knee pain, acute 04/05/2013    Kelli ChurnBallie, Chundra Sauerwein Johnson, OTR/L 04/26/2018, 4:38 PM  [facility] [address] [place], [place], [address] Phone: [phone]   Fax:  [phone]  Name: Gerline LegacyMichael  Musa MRN: [id number] Date of Birth: 04-Nov-1960

## 2018-04-30 ENCOUNTER — Encounter (INDEPENDENT_AMBULATORY_CARE_PROVIDER_SITE_OTHER): Payer: Self-pay | Admitting: Physical Medicine and Rehabilitation

## 2018-04-30 ENCOUNTER — Ambulatory Visit (INDEPENDENT_AMBULATORY_CARE_PROVIDER_SITE_OTHER): Payer: Self-pay

## 2018-04-30 ENCOUNTER — Ambulatory Visit (INDEPENDENT_AMBULATORY_CARE_PROVIDER_SITE_OTHER): Payer: Medicaid Other | Admitting: Physical Medicine and Rehabilitation

## 2018-04-30 DIAGNOSIS — G8929 Other chronic pain: Secondary | ICD-10-CM

## 2018-04-30 DIAGNOSIS — I639 Cerebral infarction, unspecified: Secondary | ICD-10-CM

## 2018-04-30 DIAGNOSIS — M25512 Pain in left shoulder: Secondary | ICD-10-CM

## 2018-04-30 MED ORDER — BUPIVACAINE HCL 0.5 % IJ SOLN
3.0000 mL | INTRAMUSCULAR | Status: AC | PRN
  Administered 2018-04-30: 3 mL via INTRA_ARTICULAR

## 2018-04-30 MED ORDER — TRIAMCINOLONE ACETONIDE 40 MG/ML IJ SUSP
80.0000 mg | INTRAMUSCULAR | Status: AC | PRN
  Administered 2018-04-30: 80 mg via INTRA_ARTICULAR

## 2018-04-30 NOTE — Progress Notes (Signed)
 .  Numeric Pain Rating Scale and Functional Assessment Average Pain 8   In the last MONTH (on 0-10 scale) has pain interfered with the following?  1. General activity like being  able to carry out your everyday physical activities such as walking, climbing stairs, carrying groceries, or moving a chair?  Rating(6)   -Dye Allergies.  

## 2018-04-30 NOTE — Progress Notes (Signed)
   Nathan Hernandez - 57 y.o. male MRN [id number]  Date of birth: [birth year]  Office Visit Note: Visit Date: 04/30/2018 PCP: Loletta SpecterGomez, Roger David, PA-C Referred by: Loletta SpecterGomez, Roger David, PA-C  Subjective: Chief Complaint  Patient presents with  . Left Shoulder - Pain  . Left Arm - Pain  . Left Hand - Pain   HPI:  Nathan LegacyMichael Hernandez is a 57 y.o. male who comes in today At the request of Dr. Burnard BuntingG. Scott Dean for intra-articular glenohumeral joint anesthetic arthrogram.  Patient has had chronic worsening left shoulder pain.  He has had a history of more recent stroke that affected his left side.  He is currently in physical therapy.  Dr. August Saucerean felt like he was developing frozen shoulder.  He has decreased range of motion and pain with motion of the shoulder.  ROS Otherwise per HPI.  Assessment & Plan: Visit Diagnoses:  1. Chronic left shoulder pain   2. Cerebrovascular accident (CVA), unspecified mechanism (HCC)     Plan: Findings:  Flow contrast was good to excellent and there did seem to be some superior extravasation of contrast perhaps signaling a rotator cuff tear.  Patient did get increased range of motion and some relief during the anesthetic phase of the injection.    Meds & Orders: No orders of the defined types were placed in this encounter.   Orders Placed This Encounter  Procedures  . Large Joint Inj: L glenohumeral  . XR C-ARM NO REPORT    Follow-up: Return if symptoms worsen or fail to improve.   Procedures: Large Joint Inj: L glenohumeral on 04/30/2018 9:00 AM Indications: pain and diagnostic evaluation Details: 22 G 3.5 in needle, anteromedial approach  Arthrogram: Yes  Medications: 3 mL bupivacaine 0.5 %; 80 mg triamcinolone acetonide 40 MG/ML  Arthrogram demonstrated good flow of contrast throughout the joint surface with some extravasation perhaps identifying rotator cuff defect.  The patient had relief of symptoms during the anesthetic phase of the  injection.  Procedure, treatment alternatives, risks and benefits explained, specific risks discussed. Consent was given by the patient. Immediately prior to procedure a time out was called to verify the correct patient, procedure, equipment, support staff and site/side marked as required. Patient was prepped and draped in the usual sterile fashion.      No notes on file   Clinical History: No specialty comments available.     Objective:  VS:  HT:    WT:   BMI:     BP:   HR: bpm  TEMP: ( )  RESP:  Physical Exam  Ortho Exam Imaging: Xr C-arm No Report  Result Date: 04/30/2018 Please see Notes tab for imaging impression.

## 2018-04-30 NOTE — Patient Instructions (Signed)

## 2018-05-01 ENCOUNTER — Ambulatory Visit: Payer: Medicaid Other | Admitting: Occupational Therapy

## 2018-05-01 ENCOUNTER — Ambulatory Visit: Payer: Medicaid Other | Admitting: Physical Therapy

## 2018-05-01 ENCOUNTER — Encounter: Payer: Self-pay | Admitting: Occupational Therapy

## 2018-05-01 DIAGNOSIS — R2681 Unsteadiness on feet: Secondary | ICD-10-CM

## 2018-05-01 DIAGNOSIS — I69354 Hemiplegia and hemiparesis following cerebral infarction affecting left non-dominant side: Secondary | ICD-10-CM | POA: Diagnosis not present

## 2018-05-01 DIAGNOSIS — M6281 Muscle weakness (generalized): Secondary | ICD-10-CM | POA: Diagnosis not present

## 2018-05-01 DIAGNOSIS — M25512 Pain in left shoulder: Secondary | ICD-10-CM

## 2018-05-01 DIAGNOSIS — R2689 Other abnormalities of gait and mobility: Secondary | ICD-10-CM

## 2018-05-01 DIAGNOSIS — R278 Other lack of coordination: Secondary | ICD-10-CM

## 2018-05-01 DIAGNOSIS — Z0279 Encounter for issue of other medical certificate: Secondary | ICD-10-CM | POA: Diagnosis not present

## 2018-05-01 NOTE — Therapy (Signed)
[facility] [address] [place], [place], [address] Phone: [phone]   Fax:  [phone]  Occupational Therapy Treatment  Patient Details  Name: Nathan Hernandez MRN: [id number] Date of Birth: 10/15/1960 Referring Provider (OT): George Hugh   Encounter Date: 05/01/2018  OT End of Session - 05/01/18 1630    Visit Number  4    Number of Visits  15    Date for OT Re-Evaluation  06/05/18    Authorization Type  MCD--8 OT visits approved    Authorization Time Period  11/18-12/15/19    Authorization - Visit Number  3    Authorization - Number of Visits  8    OT Start Time  1446    OT Stop Time  1540    OT Time Calculation (min)  54 min    Activity Tolerance  Patient tolerated treatment well    Behavior During Therapy  [facility] for tasks assessed/performed       Past Medical History:  Diagnosis Date  . HLD (hyperlipidemia)   . Hypertension   . Osteoarthritis   . Polysubstance abuse (HCC)   . Stroke [facility])     Past Surgical History:  Procedure Laterality Date  . GROIN DEBRIDEMENT Left 11/24/2017   Procedure: EVACUATION OF LEFT GROIN HEMATOMA, REPAIR OF LEFT FEMORAL ARTERY BRACH;  Surgeon: Larina Earthly, MD;  Location: [facility] OR;  Service: Vascular;  Laterality: Left;  . KNEE SURGERY    . TRANSCAROTID ARTERY REVASCULARIZATION (TCAR)  11/24/2017  . TRANSCAROTID ARTERY REVASCULARIZATION Right 11/24/2017   Procedure: TRANSCAROTID ARTERY REVASCULARIZATION;  Surgeon: Nada Libman, MD;  Location: [facility] OR;  Service: Vascular;  Laterality: Right;    There were no vitals filed for this visit.  Subjective Assessment - 05/01/18 1622    Subjective   Patient indicates that he received an injection in his shoulder yesterday - still painful    Pertinent History  CVA 11/20/17. PMH: multiple small Rt cerebral watershed infarcts, and bilateral BG and pontine infarcts, HTN, HLD, polysubstance abuse    Currently in Pain?  Yes    Pain Score  3     Pain Location  Shoulder    Pain Orientation  Left    Pain Descriptors / Indicators  Sharp    Pain Type  Acute pain    Pain Onset  More than a month ago    Pain Frequency  Constant    Aggravating Factors   overactivity, fast movement, poor positioning    Pain Relieving Factors  rest, tylenol, supported position                   OT Treatments/Exercises (OP) - 05/01/18 0001      Neurological Re-education Exercises   Other Exercises 1  Neuromuscular reeducation to address shoulder pain.  Patient with pain at night - difficulty getting comfortable in bed at night.  Reviewed positioning for support of distal humerus to elevate above proximal humerus.  Patient also needed cueing to allow shoulder to drop toward surface in supine - very active, very guarded throughout LUE.  Helped patient roll gently toward left and away from left to begin body on arm movement - to open shoulder girdle.  Patient with muscle spasming with overwork, or with position change.  Discussed current medications with patient, and he indicated that he did take Baclophen, and referring provider said amount could be adjusted based on his response.  Will reach out to Neurology with patient's permission to  inquire about dosage.      Other Exercises 2  Followed with seated exercises to address pre-low reach pattern - shoulder, elbow coordiantion.  Worked with patient to reduce ratcheting quality to movement.  Patient reports he thinks shot is starting to make his arm feel a little better.               OT Education - 05/01/18 1629    Education Details  positioning for sleep, reduce speed of motion to reduce pain, work only in pain free ranges, and be aware of body position with any shoulder motion    Person(s) Educated  Patient    Methods  Explanation;Demonstration    Comprehension  Verbalized understanding;Need further instruction       OT Short Term Goals - 04/16/18 1454      OT SHORT  TERM GOAL #1   Title  Independent with initial HEP for LUE - 05/15/18    Baseline  dependent    Time  4    Period  Weeks    Status  New      OT SHORT TERM GOAL #2   Title  Pt able to achieve LUE shoulder flexion to 60* w/ pain 3/10 or under for low range functional reaching using only min compensations    Baseline  7/10 - 10/10 pain w/ max compensations at 70*    Time  4    Period  Weeks    Status  New      OT SHORT TERM GOAL #3   Title  Pt to improve coordination Lt hand as evidenced by reducing speed on 9 hole peg test to 60 sec. or less     Baseline  68.41 sec.     Time  4    Period  Weeks    Status  New      OT SHORT TERM GOAL #4   Title  Pt to perform low level lateral reaching LUE to 50* w/ pain less than or equal to 3/10    Baseline  10/10 pain    Time  4    Period  Weeks    Status  New        OT Long Term Goals - 04/16/18 1500      OT LONG TERM GOAL #1   Title  Independent with updated HEP for LUE    Baseline  Dependent    Time  7    Period  Weeks    Status  New      OT LONG TERM GOAL #2   Title  Pt to demo 90* shoulder flexion with min compensations and pain 3/10 or under for mid level light reaching    Baseline  approx 70* w/ pain 7-10/10    Time  7    Period  Weeks    Status  New      OT LONG TERM GOAL #3   Title  Pt to improve coordination Lt hand as demo by decreasing speed on 9 hole peg test to 50 sec. or less     Baseline  68.41 sec    Time  7    Period  Weeks    Status  New      OT LONG TERM GOAL #4   Title  Improve grip strength Lt hand to 35 lbs or greater    Baseline  24 lbs (Rt = 87 lbs)    Time  7    Period  Weeks  Status  New            Plan - 05/01/18 1631    Clinical Impression Statement  Patient responded well to body on arm, slow gentle motion to improve shoulder joint motion,.      Occupational Profile and client history currently impacting functional performance  PMH: Multiple watershed infarcts, HTN, HLD,  polysubstance abuse    Occupational performance deficits (Please refer to evaluation for details):  ADL's;IADL's;Rest and Sleep;Social Participation;Leisure    Rehab Potential  Good    Current Impairments/barriers affecting progress:  severity of pain Lt shoulder (possibly orthopedic problems from stroke)     OT Frequency  2x / week    OT Duration  6 weeks    OT Treatment/Interventions  Self-care/ADL training;Moist Heat;DME and/or AE instruction;Splinting;Aquatic Therapy;Therapeutic activities;Therapeutic exercise;Cognitive remediation/compensation;Neuromuscular education;Passive range of motion;Patient/family education;Electrical Stimulation;Manual Therapy    Plan  NMR to address shoulder pain, gentle motion, body on arm    Clinical Decision Making  Several treatment options, min-mod task modification necessary    Consulted and Agree with Plan of Care  Patient       Patient will benefit from skilled therapeutic intervention in order to improve the following deficits and impairments:  Decreased coordination, Decreased range of motion, Improper body mechanics, Decreased coping skills, Decreased safety awareness, Decreased knowledge of precautions, Impaired tone, Decreased balance, Decreased knowledge of use of DME, Impaired UE functional use, Pain, Decreased cognition, Decreased mobility, Decreased strength, Impaired perceived functional ability, Impaired vision/preception  Visit Diagnosis: Hemiplegia and hemiparesis following cerebral infarction affecting left non-dominant side (HCC)  Acute pain of left shoulder  Unsteadiness on feet  Muscle weakness (generalized)  Other lack of coordination    Problem List Patient Active Problem List   Diagnosis Date Noted  . Carotid artery disease (HCC) 11/25/2017  . Carotid stenosis 11/24/2017  . Hyperlipidemia 11/21/2017  . Marijuana use 11/21/2017  . Acute bilat watershed infarction [facility](HCC) 11/20/2017  . Dyslipidemia 11/20/2017  . Hypertension  11/20/2017  . Polysubstance abuse (HCC) 11/20/2017  . HLD (hyperlipidemia) 05/20/2014  . Osteoarthritis of right knee 11/27/2013  . Establishing care with new doctor, encounter for 04/05/2013  . Poor vision 04/05/2013  . High blood pressure 04/05/2013  . Knee pain, acute 04/05/2013    Collier SalinaGellert, Zelma Mazariego M, OTR/L 05/01/2018, 4:33 PM  [facility] [address] [place], [place], [address] Phone: [phone]   Fax:  [phone]  Name: Gerline LegacyMichael Wakeman MRN: [id number] Date of Birth: [birth year]

## 2018-05-01 NOTE — Addendum Note (Signed)
Addended by: April MansonNELSON, Renu Asby R on: 05/01/2018 03:51 PM   Modules accepted: Orders

## 2018-05-01 NOTE — Therapy (Signed)
[facility] [address] [place], [place], [address] Phone: [phone]   Fax:  [phone]  Physical Therapy Treatment  Patient Details  Name: Nathan Hernandez MRN: [id number] Date of Birth: 1960-07-19 Referring Provider (PT): Venancio Poisson, NP   Encounter Date: 05/01/2018  PT End of Session - 05/01/18 1541    Visit Number  4    Number of Visits  4    Date for PT Re-Evaluation  05/29/18    Authorization Type  Medicaid    Authorization Time Period  resubmitted 05/01/18 for 4 additional visits one time per week for 4 more weeks    Authorization - Visit Number  3    Authorization - Number of Visits  3    PT Start Time  0370   pt late   PT Stop Time  1445    PT Time Calculation (min)  27 min    Activity Tolerance  Patient tolerated treatment well    Behavior During Therapy  [facility] for tasks assessed/performed       Past Medical History:  Diagnosis Date  . HLD (hyperlipidemia)   . Hypertension   . Osteoarthritis   . Polysubstance abuse ([place])   . Stroke [facility])     Past Surgical History:  Procedure Laterality Date  . GROIN DEBRIDEMENT Left 11/24/2017   Procedure: EVACUATION OF LEFT GROIN HEMATOMA, REPAIR OF LEFT FEMORAL ARTERY BRACH;  Surgeon: Rosetta Posner, MD;  Location: [facility];  Service: Vascular;  Laterality: Left;  . KNEE SURGERY    . TRANSCAROTID ARTERY REVASCULARIZATION (TCAR)  11/24/2017  . TRANSCAROTID ARTERY REVASCULARIZATION Right 11/24/2017   Procedure: TRANSCAROTID ARTERY REVASCULARIZATION;  Surgeon: Serafina Mitchell, MD;  Location: [facility] OR;  Service: Vascular;  Laterality: Right;    There were no vitals filed for this visit.  Subjective Assessment - 05/01/18 1539    Subjective  Pt relays no pain today except with shoulder, he got injection for this yesterday he reports. He feels like he is 40% better with PT back to his normal    Pertinent History  HTN, B watershed infarction, HLD, polysubstance  abuse    Limitations  Walking    How long can you walk comfortably?  20-30 min    Patient Stated Goals  reduce reliance on cane    Currently in Pain?  Yes    Pain Score  3     Pain Location  Shoulder    Pain Orientation  Left         [facility] PT Assessment - 05/01/18 0001      Assessment   Medical Diagnosis  CVA    Referring Provider (PT)  Venancio Poisson, NP    Onset Date/Surgical Date  11/20/17    Hand Dominance  Right    Prior Therapy  acute PT, OT      Ambulation/Gait   Ambulation/Gait  Yes    Ambulation/Gait Assistance  5: Supervision    Ambulation Distance (Feet)  300 Feet    Assistive device  None    Gait Pattern  Step-through pattern;Decreased stance time - left;Decreased step length - right;Decreased hip/knee flexion - left;Decreased weight shift to left    Gait velocity  4 ft/sec during 10 MWT, no AD      Balance   Balance Assessed  Yes      Standardized Balance Assessment   Five times sit to stand comments   15 sec      High Level Balance  High Level Balance Comments  Airex pad feet together eyes open 1 min, feet apart eyes closed 1 min X 2, tandem 30 sec X 2 bilat, airex lateral step on and over X 10 bilat no UE support      Functional Gait  Assessment   Gait assessed   Yes    Gait Level Surface  Walks 20 ft in less than 7 sec but greater than 5.5 sec, uses assistive device, slower speed, mild gait deviations, or deviates 6-10 in outside of the 12 in walkway width.    Change in Gait Speed  Able to change speed, demonstrates mild gait deviations, deviates 6-10 in outside of the 12 in walkway width, or no gait deviations, unable to achieve a major change in velocity, or uses a change in velocity, or uses an assistive device.    Gait with Horizontal Head Turns  Performs head turns smoothly with slight change in gait velocity (eg, minor disruption to smooth gait path), deviates 6-10 in outside 12 in walkway width, or uses an assistive device.    Gait with Vertical  Head Turns  Performs task with slight change in gait velocity (eg, minor disruption to smooth gait path), deviates 6 - 10 in outside 12 in walkway width or uses assistive device    Gait and Pivot Turn  Pivot turns safely in greater than 3 sec and stops with no loss of balance, or pivot turns safely within 3 sec and stops with mild imbalance, requires small steps to catch balance.    Step Over Obstacle  Is able to step over one shoe box (4.5 in total height) without changing gait speed. No evidence of imbalance.    Gait with Narrow Base of Support  Ambulates 4-7 steps.    Gait with Eyes Closed  Walks 20 ft, uses assistive device, slower speed, mild gait deviations, deviates 6-10 in outside 12 in walkway width. Ambulates 20 ft in less than 9 sec but greater than 7 sec.    Ambulating Backwards  Walks 20 ft, uses assistive device, slower speed, mild gait deviations, deviates 6-10 in outside 12 in walkway width.    Steps  Alternating feet, must use rail.    Total Score  19                           PT Education - 05/01/18 1541    Education Details  balance test findings and progress made, remaining deficits    Person(s) Educated  Patient    Methods  Explanation    Comprehension  Verbalized understanding          PT Long Term Goals - 05/01/18 1424      PT LONG TERM GOAL #1   Title  patient to be independent with HEP for LE strength and balance     Baseline  becoming compliant, will update to more higher level balance training    Time  3    Period  Weeks    Status  Partially Met      PT LONG TERM GOAL #2   Title  Patient to improve FGA by >/= 4 points     Baseline  19/30    Time  3    Period  Weeks    Status  Achieved      PT LONG TERM GOAL #3   Title  Patient to demonstrate appropriate gait mechanics with SPC demonstrating improved functional mobility with LRAD  Time  3    Period  Weeks    Status  Achieved      PT LONG TERM GOAL #4   Title  Patient to  improve 5x sit to stand by >/= 5 seconds for improved balance and mobility    Baseline  16.05    Time  3    Period  Weeks    Status  Achieved      PT LONG TERM GOAL #5   Title  patient to improve gait speed by >/= 0.5 ft/sec    Baseline  4 ft/sec    Time  3    Period  Weeks    Status  Achieved      Additional Long Term Goals   Additional Long Term Goals  Yes      PT LONG TERM GOAL #6   Title  New goal added 05/01/18: Pt will be able to ambulate community distances at least 500 ft on even and uneven surface, ramp, and curb without AD.     Time  4    Period  Weeks    Target Date  05/29/18            Plan - 05/01/18 1543    Clinical Impression Statement  Pt is making good progress with PT thus far and has improved his strength, balance, activity tolerance, and ambulation abilities without AD. He has now met 4/5 PT goals and will continue to benefit from skilled PT to address his remaining deficits in dynamic gait and balance. There are no barriers to progress at this time and he remains motivated to continue with PT.    Rehab Potential  Good    PT Frequency  1x / week    PT Duration  4 weeks    PT Treatment/Interventions  ADLs/Self Care Home Management;Electrical Stimulation;Moist Heat;Therapeutic exercise;Therapeutic activities;Functional mobility training;Stair training;Gait training;DME Instruction;Balance training;Neuromuscular re-education;Patient/family education;Manual techniques;Vasopneumatic Device;Taping;Passive range of motion    PT Next Visit Plan  continue high level balance and strength    PT Home Exercise Plan  [id number]    Consulted and Agree with Plan of Care  Patient       Patient will benefit from skilled therapeutic intervention in order to improve the following deficits and impairments:  Abnormal gait, Decreased activity tolerance, Decreased balance, Decreased coordination, Decreased mobility, Difficulty walking, Decreased strength, Decreased endurance,  Pain  Visit Diagnosis: Unsteadiness on feet  Other abnormalities of gait and mobility  Muscle weakness (generalized)     Problem List Patient Active Problem List   Diagnosis Date Noted  . Carotid artery disease ([place]) 11/25/2017  . Carotid stenosis 11/24/2017  . Hyperlipidemia 11/21/2017  . Marijuana use 11/21/2017  . Acute bilat watershed infarction [facility]) 11/20/2017  . Dyslipidemia 11/20/2017  . Hypertension 11/20/2017  . Polysubstance abuse ([place]) 11/20/2017  . HLD (hyperlipidemia) 05/20/2014  . Osteoarthritis of right knee 11/27/2013  . Establishing care with new doctor, encounter for 04/05/2013  . Poor vision 04/05/2013  . High blood pressure 04/05/2013  . Knee pain, acute 04/05/2013    Debbe Odea, PT,DPT 05/01/2018, 3:49 PM  [facility] [address] [place], [place], [address] Phone: [phone]   Fax:  [phone]  Name: Derreck Wiltsey MRN: [id number] Date of Birth: 1961/05/22

## 2018-05-07 ENCOUNTER — Ambulatory Visit: Payer: Medicaid Other | Admitting: Occupational Therapy

## 2018-05-10 ENCOUNTER — Ambulatory Visit: Payer: Medicaid Other | Attending: Internal Medicine | Admitting: Occupational Therapy

## 2018-05-10 DIAGNOSIS — M25512 Pain in left shoulder: Secondary | ICD-10-CM | POA: Diagnosis not present

## 2018-05-10 DIAGNOSIS — M6281 Muscle weakness (generalized): Secondary | ICD-10-CM | POA: Insufficient documentation

## 2018-05-10 DIAGNOSIS — R278 Other lack of coordination: Secondary | ICD-10-CM | POA: Insufficient documentation

## 2018-05-10 DIAGNOSIS — I69354 Hemiplegia and hemiparesis following cerebral infarction affecting left non-dominant side: Secondary | ICD-10-CM

## 2018-05-10 DIAGNOSIS — R2681 Unsteadiness on feet: Secondary | ICD-10-CM | POA: Insufficient documentation

## 2018-05-10 NOTE — Therapy (Signed)
[facility] [address] [place], [place], [address] Phone: [phone]   Fax:  [phone]  Occupational Therapy Treatment  Patient Details  Name: Nathan Hernandez MRN: [id number] Date of Birth: [birth year] Referring Provider (OT): George Hugh   Encounter Date: 05/10/2018  OT End of Session - 05/10/18 1222    Visit Number  5    Number of Visits  15    Date for OT Re-Evaluation  06/05/18    Authorization Type  MCD--8 OT visits approved    Authorization Time Period  11/18-12/15/19    Authorization - Visit Number  4    Authorization - Number of Visits  8    OT Start Time  1100    OT Stop Time  1150    OT Time Calculation (min)  50 min    Activity Tolerance  Patient tolerated treatment well    Behavior During Therapy  [facility] for tasks assessed/performed       Past Medical History:  Diagnosis Date  . HLD (hyperlipidemia)   . Hypertension   . Osteoarthritis   . Polysubstance abuse ([facility])   . Stroke [facility])     Past Surgical History:  Procedure Laterality Date  . GROIN DEBRIDEMENT Left 11/24/2017   Procedure: EVACUATION OF LEFT GROIN HEMATOMA, REPAIR OF LEFT FEMORAL ARTERY BRACH;  Surgeon: Larina Earthly, MD;  Location: [facility] OR;  Service: Vascular;  Laterality: Left;  . KNEE SURGERY    . TRANSCAROTID ARTERY REVASCULARIZATION (TCAR)  11/24/2017  . TRANSCAROTID ARTERY REVASCULARIZATION Right 11/24/2017   Procedure: TRANSCAROTID ARTERY REVASCULARIZATION;  Surgeon: Nada Libman, MD;  Location: [facility] OR;  Service: Vascular;  Laterality: Right;    There were no vitals filed for this visit.  Subjective Assessment - 05/10/18 1114    Subjective   My pain is a little better    Pertinent History  CVA 11/20/17. PMH: multiple small Rt cerebral watershed infarcts, and bilateral BG and pontine infarcts, HTN, HLD, polysubstance abuse    Currently in Pain?  Yes    Pain Score  5     Pain Location  Shoulder    Pain Orientation   Left    Pain Descriptors / Indicators  Sharp    Pain Type  Acute pain    Pain Onset  More than a month ago    Aggravating Factors   Certain movements    Pain Relieving Factors  cortisone injection        Supine: BUE chest press w/ PVC frame followed by higher flexion. Progressed to holding ball for more control, however pt still required tactile cueing and occasional assist to prevent upper trap activation.  Prone: worked on bilateral scapula retraction and LUE shoulder extension with tactile cues for scapula depression and retraction.  Seated: chair push ups for downward depression of scapula. Followed by low level bilateral and unilateral reaching in flexion and abduction.                      OT Short Term Goals - 05/10/18 1223      OT SHORT TERM GOAL #1   Title  Independent with initial HEP for LUE - 05/15/18    Baseline  dependent    Time  4    Period  Weeks    Status  On-going      OT SHORT TERM GOAL #2   Title  Pt able to achieve LUE shoulder flexion to 60* w/ pain 3/10  or under for low range functional reaching using only min compensations    Baseline  7/10 - 10/10 pain w/ max compensations at 70*    Time  4    Period  Weeks    Status  On-going      OT SHORT TERM GOAL #3   Title  Pt to improve coordination Lt hand as evidenced by reducing speed on 9 hole peg test to 60 sec. or less     Baseline  68.41 sec.     Time  4    Period  Weeks    Status  New      OT SHORT TERM GOAL #4   Title  Pt to perform low level lateral reaching LUE to 50* w/ pain less than or equal to 3/10    Baseline  10/10 pain    Time  4    Period  Weeks    Status  On-going        OT Long Term Goals - 04/16/18 1500      OT LONG TERM GOAL #1   Title  Independent with updated HEP for LUE    Baseline  Dependent    Time  7    Period  Weeks    Status  New      OT LONG TERM GOAL #2   Title  Pt to demo 90* shoulder flexion with min compensations and pain 3/10 or under for  mid level light reaching    Baseline  approx 70* w/ pain 7-10/10    Time  7    Period  Weeks    Status  New      OT LONG TERM GOAL #3   Title  Pt to improve coordination Lt hand as demo by decreasing speed on 9 hole peg test to 50 sec. or less     Baseline  68.41 sec    Time  7    Period  Weeks    Status  New      OT LONG TERM GOAL #4   Title  Improve grip strength Lt hand to 35 lbs or greater    Baseline  24 lbs (Rt = 87 lbs)    Time  7    Period  Weeks    Status  New            Plan - 05/10/18 1226    Clinical Impression Statement  Pt w/ significantly decreased pain during midrange to higher ROM supine and low range sitting up. Pt still activates upper traps and requires tactile cueing to correct.     Occupational Profile and client history currently impacting functional performance  PMH: Multiple watershed infarcts, HTN, HLD, polysubstance abuse    Occupational performance deficits (Please refer to evaluation for details):  ADL's;IADL's;Rest and Sleep;Social Participation;Leisure    Rehab Potential  Good    Current Impairments/barriers affecting progress:  severity of pain Lt shoulder (possibly orthopedic problems from stroke)     OT Frequency  2x / week    OT Duration  6 weeks    OT Treatment/Interventions  Self-care/ADL training;Moist Heat;DME and/or AE instruction;Splinting;Aquatic Therapy;Therapeutic activities;Therapeutic exercise;Cognitive remediation/compensation;Neuromuscular education;Passive range of motion;Patient/family education;Electrical Stimulation;Manual Therapy    Plan  NMR to address shoulder pain, gentle motion, body on arm    Consulted and Agree with Plan of Care  Patient       Patient will benefit from skilled therapeutic intervention in order to improve the following deficits and impairments:  Decreased coordination, Decreased range of motion, Improper body mechanics, Decreased coping skills, Decreased safety awareness, Decreased knowledge of  precautions, Impaired tone, Decreased balance, Decreased knowledge of use of DME, Impaired UE functional use, Pain, Decreased cognition, Decreased mobility, Decreased strength, Impaired perceived functional ability, Impaired vision/preception  Visit Diagnosis: Acute pain of left shoulder  Hemiplegia and hemiparesis following cerebral infarction affecting left non-dominant side [facility](HCC)    Problem List Patient Active Problem List   Diagnosis Date Noted  . Carotid artery disease ([facility]) 11/25/2017  . Carotid stenosis 11/24/2017  . Hyperlipidemia 11/21/2017  . Marijuana use 11/21/2017  . Acute bilat watershed infarction [facility](HCC) 11/20/2017  . Dyslipidemia 11/20/2017  . Hypertension 11/20/2017  . Polysubstance abuse ([facility]) 11/20/2017  . HLD (hyperlipidemia) 05/20/2014  . Osteoarthritis of right knee 11/27/2013  . Establishing care with new doctor, encounter for 04/05/2013  . Poor vision 04/05/2013  . High blood pressure 04/05/2013  . Knee pain, acute 04/05/2013    Kelli ChurnBallie, Ky Moskowitz Johnson, OTR/L 05/10/2018, 12:29 PM  [facility] [address] [place], [place], [address] Phone: [phone]   Fax:  [phone]  Name: Nathan LegacyMichael Hernandez MRN: [id number] Date of Birth: 1960-06-15

## 2018-05-14 ENCOUNTER — Ambulatory Visit: Payer: Medicaid Other | Admitting: Occupational Therapy

## 2018-05-14 ENCOUNTER — Encounter: Payer: Self-pay | Admitting: Occupational Therapy

## 2018-05-14 ENCOUNTER — Telehealth: Payer: Self-pay | Admitting: Adult Health

## 2018-05-14 DIAGNOSIS — I69354 Hemiplegia and hemiparesis following cerebral infarction affecting left non-dominant side: Secondary | ICD-10-CM | POA: Diagnosis not present

## 2018-05-14 DIAGNOSIS — R2681 Unsteadiness on feet: Secondary | ICD-10-CM | POA: Diagnosis not present

## 2018-05-14 DIAGNOSIS — M25512 Pain in left shoulder: Secondary | ICD-10-CM

## 2018-05-14 DIAGNOSIS — R278 Other lack of coordination: Secondary | ICD-10-CM | POA: Diagnosis not present

## 2018-05-14 DIAGNOSIS — M6281 Muscle weakness (generalized): Secondary | ICD-10-CM

## 2018-05-14 MED ORDER — BACLOFEN 5 MG PO TABS: 10.0000 mg | ORAL_TABLET | Freq: Three times a day (TID) | ORAL | 3 refills | Status: DC

## 2018-05-14 MED FILL — CLOPIDOGREL 75 MG TABLET: 75 | 30 days supply | Qty: 30 | Fill #3

## 2018-05-14 MED FILL — ATORVASTATIN 80 MG TABLET: 80 | 30 days supply | Qty: 30 | Fill #3

## 2018-05-14 NOTE — Telephone Encounter (Signed)
Rn call patient about wanting to increase the baclofen dosage. PT stated he is seeing [facility] and they gave him a shot two weeks. He states the injection help but its still stiff. Pt did not if he should continue taking the baclofen three times a day or pick up the medication. Rn advised pt to continue taking the medication until he gets some more recommendations from our office. Rn stated the message will go the work in doctor.Pt verbalized understanding.

## 2018-05-14 NOTE — Addendum Note (Signed)
Addended by: Huston FoleyATHAR, Bayla Mcgovern on: 05/14/2018 01:37 PM   Modules accepted: Orders

## 2018-05-14 NOTE — Telephone Encounter (Signed)
For his spasticity, we can try to increase baclofen to 10 mg 3 times a day from currently 5 mg 3 times a day. Please remind/advise patient that baclofen can be sedating and that he may not be able to drive after taking it or become quite sleepy from it. Rx adjusted and sent to pharm on file.

## 2018-05-14 NOTE — Telephone Encounter (Signed)
Patient is still having stiffness in his left arm and would like to discuss increasing  dosage of Baclofen 5 MG TABS. I advised Shanda BumpsJessica is out of the office and will send to her nurse.

## 2018-05-14 NOTE — Therapy (Signed)
[facility] [address] [place], [place], [address] Phone: [phone]   Fax:  [phone]  Occupational Therapy Treatment  Patient Details  Name: Nathan Hernandez MRN: [id number] Date of Birth: 29-Mar-1961 Referring Provider (OT): George Hugh   Encounter Date: 05/14/2018  OT End of Session - 05/14/18 1236    Visit Number  6    Number of Visits  15    Date for OT Re-Evaluation  06/05/18    Authorization Type  MCD--8 OT visits approved    Authorization Time Period  11/18-12/15/19    Authorization - Visit Number  5    Authorization - Number of Visits  8    OT Start Time  1020    OT Stop Time  1103    OT Time Calculation (min)  43 min    Activity Tolerance  Patient tolerated treatment well    Behavior During Therapy  [facility] for tasks assessed/performed       Past Medical History:  Diagnosis Date  . HLD (hyperlipidemia)   . Hypertension   . Osteoarthritis   . Polysubstance abuse (HCC)   . Stroke [facility])     Past Surgical History:  Procedure Laterality Date  . GROIN DEBRIDEMENT Left 11/24/2017   Procedure: EVACUATION OF LEFT GROIN HEMATOMA, REPAIR OF LEFT FEMORAL ARTERY BRACH;  Surgeon: Larina Earthly, MD;  Location: [facility] OR;  Service: Vascular;  Laterality: Left;  . KNEE SURGERY    . TRANSCAROTID ARTERY REVASCULARIZATION (TCAR)  11/24/2017  . TRANSCAROTID ARTERY REVASCULARIZATION Right 11/24/2017   Procedure: TRANSCAROTID ARTERY REVASCULARIZATION;  Surgeon: Nada Libman, MD;  Location: [facility] OR;  Service: Vascular;  Laterality: Right;    There were no vitals filed for this visit.  Subjective Assessment - 05/14/18 1226    Subjective   patient indicated shoulder pain was better when he lies on his back initially when going to sleep - but he moves during the night, and wakes up with soreness in shoulder.      Pertinent History  CVA 11/20/17. PMH: multiple small Rt cerebral watershed infarcts, and bilateral BG and  pontine infarcts, HTN, HLD, polysubstance abuse    Currently in Pain?  Yes    Pain Score  4     Pain Location  Shoulder    Pain Orientation  Left    Pain Descriptors / Indicators  Aching    Pain Type  Acute pain    Pain Onset  More than a month ago    Pain Frequency  Intermittent    Aggravating Factors   Movement    Pain Relieving Factors  cortisone injection, rest                   OT Treatments/Exercises (OP) - 05/14/18 0001      Neurological Re-education Exercises   Other Exercises 1  Neuromuscular reeducation to address left UE movement.  Worked on moving body onto left arm, and even accepting weight onto left limbs, rolling supine to left sidelying.  Worked on progressively increasing shoulder flexion range, isolating full elbow extension, forearm, wrist and hand motion in sidelying.  Patient with report of wrist pain dorsally - between prox and distal carpal bones.  In sitting worked toward long arm weight bearing with facilitation and cueing for uprigrht posture and to reduce strong pull of internal rotation with activation.  Assisted patient to 4 point with gentle weight accpetance onto left UE. Patient had difficulty with wrist discomfort in this  position.  Returned to sitting - provided traction and mobiizatyion to carpal bones with gentle passive motion in all directions.  Followed with 4 point with hand in modified position to slightly decrease wrist extension - no pain.  Worked on shifting weight toward left hand, and backward to increase shoulder flexion.               OT Education - 05/14/18 1235    Education Details  discouraged patient from attempting mid reach repetitively - encouraged low reach with LUE    Person(s) Educated  Patient    Methods  Explanation;Demonstration    Comprehension  Verbalized understanding       OT Short Term Goals - 05/14/18 1238      OT SHORT TERM GOAL #1   Title  Independent with initial HEP for LUE - 05/15/18    Status   On-going      OT SHORT TERM GOAL #2   Title  Pt able to achieve LUE shoulder flexion to 60* w/ pain 3/10 or under for low range functional reaching using only min compensations    Baseline  7/10 - 10/10 pain w/ max compensations at 70*    Time  4    Period  Weeks    Status  Achieved      OT SHORT TERM GOAL #3   Title  Pt to improve coordination Lt hand as evidenced by reducing speed on 9 hole peg test to 60 sec. or less     Baseline  68.41 sec.     Time  4    Period  Weeks    Status  On-going      OT SHORT TERM GOAL #4   Title  Pt to perform low level lateral reaching LUE to 50* w/ pain less than or equal to 3/10    Baseline  10/10 pain    Time  4    Period  Weeks    Status  Achieved        OT Long Term Goals - 05/14/18 1239      OT LONG TERM GOAL #1   Title  Independent with updated HEP for LUE    Baseline  Dependent    Time  7    Period  Weeks    Status  On-going      OT LONG TERM GOAL #2   Title  Pt to demo 90* shoulder flexion with min compensations and pain 3/10 or under for mid level light reaching    Period  Weeks    Status  On-going      OT LONG TERM GOAL #3   Title  Pt to improve coordination Lt hand as demo by decreasing speed on 9 hole peg test to 50 sec. or less     Baseline  68.41 sec    Time  7    Status  On-going      OT LONG TERM GOAL #4   Title  Improve grip strength Lt hand to 35 lbs or greater    Baseline  24 lbs (Rt = 87 lbs)    Time  7    Period  Weeks    Status  On-going            Plan - 05/14/18 1236    Clinical Impression Statement  Patient showing steady improvement with reduction in shoulder pain,a nd movement in proximal LUE.      Occupational Profile and client history currently impacting functional  performance  PMH: Multiple watershed infarcts, HTN, HLD, polysubstance abuse    Occupational performance deficits (Please refer to evaluation for details):  ADL's;IADL's;Rest and Sleep;Social Participation;Leisure    Rehab  Potential  Good    Current Impairments/barriers affecting progress:  severity of pain Lt shoulder (possibly orthopedic problems from stroke)     OT Frequency  2x / week    OT Duration  6 weeks    OT Treatment/Interventions  Self-care/ADL training;Moist Heat;DME and/or AE instruction;Splinting;Aquatic Therapy;Therapeutic activities;Therapeutic exercise;Cognitive remediation/compensation;Neuromuscular education;Passive range of motion;Patient/family education;Electrical Stimulation;Manual Therapy    Plan  NMR to address shoulder pain, gentle motion, body on arm - wrist mobilization - ? splint    Clinical Decision Making  Several treatment options, min-mod task modification necessary    Consulted and Agree with Plan of Care  Patient       Patient will benefit from skilled therapeutic intervention in order to improve the following deficits and impairments:  Decreased coordination, Decreased range of motion, Improper body mechanics, Decreased coping skills, Decreased safety awareness, Decreased knowledge of precautions, Impaired tone, Decreased balance, Decreased knowledge of use of DME, Impaired UE functional use, Pain, Decreased cognition, Decreased mobility, Decreased strength, Impaired perceived functional ability, Impaired vision/preception  Visit Diagnosis: Acute pain of left shoulder  Hemiplegia and hemiparesis following cerebral infarction affecting left non-dominant side (HCC)  Other lack of coordination  Unsteadiness on feet  Muscle weakness (generalized)    Problem List Patient Active Problem List   Diagnosis Date Noted  . Carotid artery disease (HCC) 11/25/2017  . Carotid stenosis 11/24/2017  . Hyperlipidemia 11/21/2017  . Marijuana use 11/21/2017  . Acute bilat watershed infarction [facility]) 11/20/2017  . Dyslipidemia 11/20/2017  . Hypertension 11/20/2017  . Polysubstance abuse (HCC) 11/20/2017  . HLD (hyperlipidemia) 05/20/2014  . Osteoarthritis of right knee 11/27/2013  .  Establishing care with new doctor, encounter for 04/05/2013  . Poor vision 04/05/2013  . High blood pressure 04/05/2013  . Knee pain, acute 04/05/2013    Collier Salina, OTR/L 05/14/2018, 12:40 PM  [facility] [address] [place], [place], [address] Phone: [phone]   Fax:  [phone]  Name: Nathan Hernandez MRN: [id number] Date of Birth: 31-Oct-1960

## 2018-05-14 NOTE — Telephone Encounter (Signed)
Spoke with patient and informed him Dr Frances FurbishAthar has increased Baclofen to 10 mg three x daily. Advised he may not be able to drive, medication can cause drowsiness, sleepiness. Informed him Dr Frances FurbishAthar sent new Rx to his pharmacy. Advised he call for any further questions, problems. Patient verbalized understanding, appreciation.

## 2018-05-17 ENCOUNTER — Ambulatory Visit: Payer: Medicaid Other | Admitting: Occupational Therapy

## 2018-05-17 DIAGNOSIS — M25512 Pain in left shoulder: Secondary | ICD-10-CM | POA: Diagnosis not present

## 2018-05-17 DIAGNOSIS — M6281 Muscle weakness (generalized): Secondary | ICD-10-CM | POA: Diagnosis not present

## 2018-05-17 DIAGNOSIS — I69354 Hemiplegia and hemiparesis following cerebral infarction affecting left non-dominant side: Secondary | ICD-10-CM | POA: Diagnosis not present

## 2018-05-17 DIAGNOSIS — R278 Other lack of coordination: Secondary | ICD-10-CM | POA: Diagnosis not present

## 2018-05-17 DIAGNOSIS — R2681 Unsteadiness on feet: Secondary | ICD-10-CM | POA: Diagnosis not present

## 2018-05-17 DIAGNOSIS — Z7689 Persons encountering health services in other specified circumstances: Secondary | ICD-10-CM | POA: Diagnosis not present

## 2018-05-17 NOTE — Therapy (Addendum)
[facility] [address] [place], [place], [address] Phone: [phone]   Fax:  [phone]  Occupational Therapy Treatment  Patient Details  Name: Nathan Hernandez MRN: [id number] Date of Birth: 1961-05-26 Referring Provider (OT): George Hugh   Encounter Date: 05/17/2018  OT End of Session - 05/17/18 1538    Visit Number  7    Number of Visits  19   Date for OT Re-Evaluation 07/22/18    Authorization Type  MCD--8 OT visits approved    Authorization Time Period  11/18-12/15/19    Authorization - Visit Number  6    Authorization - Number of Visits  8    OT Start Time  1315    OT Stop Time  1400    OT Time Calculation (min)  45 min    Activity Tolerance  Patient tolerated treatment well    Behavior During Therapy  [facility] for tasks assessed/performed       Past Medical History:  Diagnosis Date  . HLD (hyperlipidemia)   . Hypertension   . Osteoarthritis   . Polysubstance abuse (HCC)   . Stroke [facility])     Past Surgical History:  Procedure Laterality Date  . GROIN DEBRIDEMENT Left 11/24/2017   Procedure: EVACUATION OF LEFT GROIN HEMATOMA, REPAIR OF LEFT FEMORAL ARTERY BRACH;  Surgeon: Larina Earthly, MD;  Location: [facility] OR;  Service: Vascular;  Laterality: Left;  . KNEE SURGERY    . TRANSCAROTID ARTERY REVASCULARIZATION (TCAR)  11/24/2017  . TRANSCAROTID ARTERY REVASCULARIZATION Right 11/24/2017   Procedure: TRANSCAROTID ARTERY REVASCULARIZATION;  Surgeon: Nada Libman, MD;  Location: [facility] OR;  Service: Vascular;  Laterality: Right;    There were no vitals filed for this visit.  Subjective Assessment - 05/17/18 1325    Subjective   My new medication should be ready for pick up    Pertinent History  CVA 11/20/17. PMH: multiple small Rt cerebral watershed infarcts, and bilateral BG and pontine infarcts, HTN, HLD, polysubstance abuse    Currently in Pain?  Yes    Pain Score  4     Pain Location  Shoulder     Pain Orientation  Left    Pain Descriptors / Indicators  Aching    Pain Type  Acute pain    Pain Onset  More than a month ago    Pain Frequency  Intermittent    Aggravating Factors   movement    Pain Relieving Factors  cortisone injection, rest        NMR: Supine: AA/ROM LUE in shoulder flexion beginning with bent arm, progressed to straight arm. Progressed further to bilateral sh flexion using ball for increased control LUE.  Seated: bilateral low to midrange shoulder flex to center and each side holding ball. AA/ROM in shoulder abduction with guidance to lead w/ hand, and correct shoulder girdle positioning.  Quadraped: worked on sh girdle stability static and through cat/cow stretch Seated: wt bearing through BUE's pushing down through hands for scapula depression and anterior pelvic tilt - max cues needed not to tense shoulders and neck musculature while performing                     OT Short Term Goals - 05/14/18 1238      OT SHORT TERM GOAL #1   Title  Independent with initial HEP for LUE - 05/15/18    Status Achieved     OT SHORT TERM GOAL #2   Title  Pt able to achieve LUE shoulder flexion to 60* w/ pain 3/10 or under for low range functional reaching using only min compensations    Baseline  7/10 - 10/10 pain w/ max compensations at 70*    Time  4    Period  Weeks    Status  Achieved      OT SHORT TERM GOAL #3   Title  Pt to improve coordination Lt hand as evidenced by reducing speed on 9 hole peg test to 60 sec. or less     Baseline  68.41 sec.     Time  4    Period  Weeks    Status  On-going      OT SHORT TERM GOAL #4   Title  Pt to perform low level lateral reaching LUE to 50* w/ pain less than or equal to 3/10    Baseline  10/10 pain    Time  4    Period  Weeks    Status  Achieved        OT Long Term Goals - 05/14/18 1239      OT LONG TERM GOAL #1   Title  Independent with updated HEP for LUE    Baseline  Dependent    Time  7     Period  Weeks    Status  On-going - will need updates     OT LONG TERM GOAL #2   Title  Pt to demo 90* shoulder flexion with min compensations and pain 3/10 or under for mid level light reaching    Period  Weeks    Status  On-going  (pt can achieve approx 60* w/ pain 3/10 or under)     OT LONG TERM GOAL #3   Title  Pt to improve coordination Lt hand as demo by decreasing speed on 9 hole peg test to 50 sec. or less     Baseline  68.41 sec    Time  7    Status  On-going (see above score)     OT LONG TERM GOAL #4   Title  Improve grip strength Lt hand to 35 lbs or greater    Baseline  24 lbs (Rt = 87 lbs)    Time  7    Period  Weeks    Status  On-going (see above grip strength)           Plan - 05/17/18 1540    Clinical Impression Statement  Patient showing steady improvement with reduction in shoulder pain, and movement in proximal LUE.  However, pt still guards and hikes shoulder. Pt w/ difficulty dissasociating neck/shoulder muscles from core. Renewal completed w/ addendum to note on 05/21/18   Occupational Profile and client history currently impacting functional performance  PMH: Multiple watershed infarcts, HTN, HLD, polysubstance abuse    Occupational performance deficits (Please refer to evaluation for details):  ADL's;IADL's;Rest and Sleep;Social Participation;Leisure    Rehab Potential  Good    Current Impairments/barriers affecting progress:  severity of pain Lt shoulder (possibly orthopedic problems from stroke)     OT Frequency  2x / week    OT Duration  6 weeks (beginning 06/07/18)   OT Treatment/Interventions  Self-care/ADL training;Moist Heat;DME and/or AE instruction;Splinting;Aquatic Therapy;Therapeutic activities;Therapeutic exercise;Cognitive remediation/compensation;Neuromuscular education;Passive range of motion;Patient/family education;Electrical Stimulation;Manual Therapy    Plan  NMR to address shoulder pain, gentle motion, body on arm - wrist mobilization - ?  splint    Consulted and Agree with Plan of Care  Patient       Patient will benefit from skilled therapeutic intervention in order to improve the following deficits and impairments:  Decreased coordination, Decreased range of motion, Improper body mechanics, Decreased coping skills, Decreased safety awareness, Decreased knowledge of precautions, Impaired tone, Decreased balance, Decreased knowledge of use of DME, Impaired UE functional use, Pain, Decreased cognition, Decreased mobility, Decreased strength, Impaired perceived functional ability, Impaired vision/preception  Visit Diagnosis: Acute pain of left shoulder  Hemiplegia and hemiparesis following cerebral infarction affecting left non-dominant side [facility])    Problem List Patient Active Problem List   Diagnosis Date Noted  . Carotid artery disease (HCC) 11/25/2017  . Carotid stenosis 11/24/2017  . Hyperlipidemia 11/21/2017  . Marijuana use 11/21/2017  . Acute bilat watershed infarction [facility]) 11/20/2017  . Dyslipidemia 11/20/2017  . Hypertension 11/20/2017  . Polysubstance abuse (HCC) 11/20/2017  . HLD (hyperlipidemia) 05/20/2014  . Osteoarthritis of right knee 11/27/2013  . Establishing care with new doctor, encounter for 04/05/2013  . Poor vision 04/05/2013  . High blood pressure 04/05/2013  . Knee pain, acute 04/05/2013    Kelli Churn, OTR/L 05/17/2018, 3:42 PM  [facility] [facility] [address] [place], [place], [address] Phone: [phone]   Fax:  [phone]  Name: Abbott Jasinski MRN: [id number] Date of Birth: April 02, 1961

## 2018-05-21 ENCOUNTER — Ambulatory Visit: Payer: Medicaid Other | Admitting: Occupational Therapy

## 2018-05-21 ENCOUNTER — Telehealth: Payer: Self-pay

## 2018-05-21 DIAGNOSIS — Z7689 Persons encountering health services in other specified circumstances: Secondary | ICD-10-CM | POA: Diagnosis not present

## 2018-05-21 NOTE — Telephone Encounter (Signed)
Called and spoke with patient re: MCD end date for O.T. expired 05/20/18, and plan to resubmit for more visits in 2020. Pt agreed with POC and to return for scheduled O.T. appointment on 06/07/2018

## 2018-05-21 NOTE — Addendum Note (Signed)
Addended by: Sheran LawlessBALLIE, Brylin Stanislawski J on: 05/21/2018 12:05 PM   Modules accepted: Orders

## 2018-05-24 ENCOUNTER — Encounter: Payer: Medicaid Other | Admitting: Occupational Therapy

## 2018-05-28 MED FILL — VIT D2 1.25 MG (50,000 UNIT: 1.25 MG | 28 days supply | Qty: 4 | Fill #1

## 2018-05-28 MED FILL — BACLOFEN 5 MG TABS: 5 | 30 days supply | Qty: 90 | Fill #0

## 2018-05-29 ENCOUNTER — Encounter: Payer: Medicaid Other | Admitting: Occupational Therapy

## 2018-06-01 ENCOUNTER — Encounter: Payer: Medicaid Other | Admitting: Occupational Therapy

## 2018-06-04 ENCOUNTER — Encounter: Payer: Medicaid Other | Admitting: Occupational Therapy

## 2018-06-07 ENCOUNTER — Ambulatory Visit: Payer: Medicaid Other | Admitting: Occupational Therapy

## 2018-06-11 ENCOUNTER — Ambulatory Visit: Payer: Medicaid Other | Attending: Internal Medicine | Admitting: *Deleted

## 2018-06-11 ENCOUNTER — Encounter: Payer: Self-pay | Admitting: *Deleted

## 2018-06-11 DIAGNOSIS — R278 Other lack of coordination: Secondary | ICD-10-CM

## 2018-06-11 DIAGNOSIS — I69354 Hemiplegia and hemiparesis following cerebral infarction affecting left non-dominant side: Secondary | ICD-10-CM | POA: Diagnosis not present

## 2018-06-11 DIAGNOSIS — M6281 Muscle weakness (generalized): Secondary | ICD-10-CM

## 2018-06-11 DIAGNOSIS — M25512 Pain in left shoulder: Secondary | ICD-10-CM | POA: Insufficient documentation

## 2018-06-11 NOTE — Patient Instructions (Addendum)
Keep left  forearm or elbow supported on table or armrest during all coordination ex's to assist in avoiding hiking or guarding/pain at shoulder  Lateral Pinch Strengthening (Resistive Putty)    Squeeze between thumb and side of each finger in turn. Repeat _10___ times. Do _1_ sessions per day.  Copyright  VHI. All rights reserved.  Three Jaw Chuck Pinch Strengthening (Resistive Putty)    Pull putty, using thumb, index and middle fingers. Repeat __10__ times. Do __1_ sessions per day.  Copyright  VHI. All rights reserved.  Grip Strengthening (Resistive Putty)    Squeeze putty using thumb and all fingers. Repeat __10__ times. Do _1_ sessions per day.  Fine Motor Coordination Exercises  Perform the following exercises 1 times a day, as recommended by your occupational therapist.   Touch thumb to each fingertip. Increase speed as able. (10 times)  Pick up 5 small objects (coins, marbles, paperclips, beads, etc.) one at a time and hold them in hand, then place them one by one onto the table.  Pick up small objects and place them into a cup or container.  Stack approximately [employer] (checkers, coins, etc.) onto table.  Hold stress ball in left hand and rotate using fingertips.  Place coins in putty and remove using left hand and stack

## 2018-06-11 NOTE — Therapy (Signed)
[facility] [address] [place], [place], [address] Phone: [phone]   Fax:  [phone]  Occupational Therapy Treatment  Patient Details  Name: Nathan Hernandez MRN: [id number] Date of Birth: 17-Sep-1960 Referring Provider (OT): George Hugh   Encounter Date: 06/11/2018  OT End of Session - 06/11/18 1119    Visit Number  8    Number of Visits  15    Date for OT Re-Evaluation  06/27/18    Authorization Type  MCD - 3 visits approved     Authorization Time Period  06/07/18-06/27/18    Authorization - Visit Number  1    Authorization - Number of Visits  3    OT Start Time  1014    OT Stop Time  1058    OT Time Calculation (min)  44 min    Activity Tolerance  Patient tolerated treatment well    Behavior During Therapy  [facility] for tasks assessed/performed       Past Medical History:  Diagnosis Date  . HLD (hyperlipidemia)   . Hypertension   . Osteoarthritis   . Polysubstance abuse (HCC)   . Stroke [facility])     Past Surgical History:  Procedure Laterality Date  . GROIN DEBRIDEMENT Left 11/24/2017   Procedure: EVACUATION OF LEFT GROIN HEMATOMA, REPAIR OF LEFT FEMORAL ARTERY BRACH;  Surgeon: Larina Earthly, MD;  Location: [facility] OR;  Service: Vascular;  Laterality: Left;  . KNEE SURGERY    . TRANSCAROTID ARTERY REVASCULARIZATION (TCAR)  11/24/2017  . TRANSCAROTID ARTERY REVASCULARIZATION Right 11/24/2017   Procedure: TRANSCAROTID ARTERY REVASCULARIZATION;  Surgeon: Nada Libman, MD;  Location: [facility] OR;  Service: Vascular;  Laterality: Right;    There were no vitals filed for this visit.  Subjective Assessment - 06/11/18 1019    Subjective   Pt reports that bacolfin was "supposed to be increased" but it wasn't.    Pertinent History  CVA 11/20/17. PMH: multiple small Rt cerebral watershed infarcts, and bilateral BG and pontine infarcts, HTN, HLD, polysubstance abuse    Currently in Pain?  Yes    Pain Score  4     Pain  Location  Shoulder    Pain Orientation  Left    Pain Descriptors / Indicators  Aching    Pain Type  Acute pain    Pain Onset  More than a month ago    Pain Frequency  Intermittent    Aggravating Factors   Shoulder movement    Pain Relieving Factors  Cortisone injection, rest                   OT Treatments/Exercises (OP) - 06/11/18 0001      ADLs   ADL Comments  Verbal review of Low level shoulder positioning during ADL's to avoid pain and hike of shoulder.   Pt reports increased ease of tying shoes and bathing     Exercises   Exercises  Shoulder;Theraputty;Hand      Theraputty   Theraputty - Roll  Roll putty into log prior to pinch exercises using left hand    Theraputty - Grip  Yellow putty with forearm resting tabletop to avoid shoulder hike. x10 reps    Theraputty - Pinch  Three point and lateral pinch using yellow putty woith forearm resting on table (to avoid shoulder hike).    Theraputty Hand- Locate Pegs  Placed coins in yellow putty and removed using pinch technique Left hand      Neurological  Re-education Exercises   Other Exercises 1  Verbal review of HEP and positioning LUE. Pt reports increased movement and decreased pain since last visit, however, he is noted to guard and hike his shoulder. He benefits from frequent vc's as reminder      Fine Motor Coordination (Hand/Wrist)   Fine Motor Coordination  In hand manipuation training;Picking up coins;Manipulating coins;Stacking coins   Rotating ball in left hand   In Hand Manipulation Training  Pt required Min-Mod VC's for positioning of LUE/Shoulder during in hand manipulation activities (Ie' supporting L forearm on table top vs resting elbow on arm rest etc)    Picking up coins  L UE Min VC's for proper positioning and avoidance of shoulder hike and guarding    Manipulating coins  L UE Min VC's for proper positioning and avoidance of shoulder hike and guarding    Stacking coins  L UE Min-Mod VC's for proper  positioning and avoidance of shoulder hike and guarding        Keep left  forearm or elbow supported on table or armrest during all coordination ex's to assist in avoiding hiking or guarding/pain at shoulder  Lateral Pinch Strengthening (Resistive Putty)    Squeeze between thumb and side of each finger in turn. Repeat _10___ times. Do _1_ sessions per day.  Copyright  VHI. All rights reserved.  Three Jaw Chuck Pinch Strengthening (Resistive Putty)    Pull putty, using thumb, index and middle fingers. Repeat __10__ times. Do __1_ sessions per day.  Grip Strengthening (Resistive Putty)    Squeeze putty using thumb and all fingers. Repeat __10__ times. Do _1_ sessions per day.  Fine Motor Coordination Exercises  Perform the following exercises 1 times a day, as recommended by your occupational therapist.   Touch thumb to each fingertip. Increase speed as able. (10 times)  Pick up 5 small objects (coins, marbles, paperclips, beads, etc.) one at a time and hold them in hand, then place them one by one onto the table.  Pick up small objects and place them into a cup or container.  Stack approximately [employer] (checkers, coins, etc.) onto table.  Hold stress ball in left hand and rotate using fingertips. Place coins in putty and remove using left hand and stack      OT Education - 06/11/18 1118    Education Details  Verbal review of low reach for ADL's and HEP. Upgraded and reviewed coordination LUE    Person(s) Educated  Patient    Methods  Explanation;Demonstration    Comprehension  Verbalized understanding       OT Short Term Goals - 05/14/18 1238      OT SHORT TERM GOAL #1   Title  Independent with initial HEP for LUE - 05/15/18    Status  On-going      OT SHORT TERM GOAL #2   Title  Pt able to achieve LUE shoulder flexion to 60* w/ pain 3/10 or under for low range functional reaching using only min compensations    Baseline  7/10 - 10/10 pain w/  max compensations at 70*    Time  4    Period  Weeks    Status  Achieved      OT SHORT TERM GOAL #3   Title  Pt to improve coordination Lt hand as evidenced by reducing speed on 9 hole peg test to 60 sec. or less     Baseline  68.41 sec.     Time  4    Period  Weeks    Status  On-going      OT SHORT TERM GOAL #4   Title  Pt to perform low level lateral reaching LUE to 50* w/ pain less than or equal to 3/10    Baseline  10/10 pain    Time  4    Period  Weeks    Status  Achieved        OT Long Term Goals - 05/14/18 1239      OT LONG TERM GOAL #1   Title  Independent with updated HEP for LUE    Baseline  Dependent    Time  7    Period  Weeks    Status  On-going      OT LONG TERM GOAL #2   Title  Pt to demo 90* shoulder flexion with min compensations and pain 3/10 or under for mid level light reaching    Period  Weeks    Status  On-going      OT LONG TERM GOAL #3   Title  Pt to improve coordination Lt hand as demo by decreasing speed on 9 hole peg test to 50 sec. or less     Baseline  68.41 sec    Time  7    Status  On-going      OT LONG TERM GOAL #4   Title  Improve grip strength Lt hand to 35 lbs or greater    Baseline  24 lbs (Rt = 87 lbs)    Time  7    Period  Weeks    Status  On-going            Plan - 06/11/18 1123    Clinical Impression Statement  Pt reports improved pain in left shoulder during ADL's and ex's with low level reach and avoidance of shoulder hike/guarding during functional activity. He still guards and hikes and benefits from vc's and tc's for positioning. He should benefit from upgraded home program for coordination/putty as instructed today.    Occupational Profile and client history currently impacting functional performance  PMH: Multiple watershed infarcts, HTN, HLD, polysubstance abuse    Occupational performance deficits (Please refer to evaluation for details):  ADL's;IADL's;Rest and Sleep;Social Participation;Leisure    Rehab  Potential  Good    Current Impairments/barriers affecting progress:  severity of pain Lt shoulder (possibly orthopedic problems from stroke)     OT Frequency  2x / week    OT Duration  6 weeks    OT Treatment/Interventions  Self-care/ADL training;Moist Heat;DME and/or AE instruction;Splinting;Aquatic Therapy;Therapeutic activities;Therapeutic exercise;Cognitive remediation/compensation;Neuromuscular education;Passive range of motion;Patient/family education;Electrical Stimulation;Manual Therapy    Plan  NMR to address shoulder pain, gentle motion, body on arm or wrist. ? review coordination HEP    Clinical Decision Making  Several treatment options, min-mod task modification necessary    Consulted and Agree with Plan of Care  Patient       Patient will benefit from skilled therapeutic intervention in order to improve the following deficits and impairments:  Decreased coordination, Decreased range of motion, Improper body mechanics, Decreased coping skills, Decreased safety awareness, Decreased knowledge of precautions, Impaired tone, Decreased balance, Decreased knowledge of use of DME, Impaired UE functional use, Pain, Decreased cognition, Decreased mobility, Decreased strength, Impaired perceived functional ability, Impaired vision/preception  Visit Diagnosis: Acute pain of left shoulder  Hemiplegia and hemiparesis following cerebral infarction affecting left non-dominant side (HCC)  Other lack of coordination  Muscle weakness (  generalized)    Problem List Patient Active Problem List   Diagnosis Date Noted  . Carotid artery disease (HCC) 11/25/2017  . Carotid stenosis 11/24/2017  . Hyperlipidemia 11/21/2017  . Marijuana use 11/21/2017  . Acute bilat watershed infarction [facility](HCC) 11/20/2017  . Dyslipidemia 11/20/2017  . Hypertension 11/20/2017  . Polysubstance abuse (HCC) 11/20/2017  . HLD (hyperlipidemia) 05/20/2014  . Osteoarthritis of right knee 11/27/2013  . Establishing care  with new doctor, encounter for 04/05/2013  . Poor vision 04/05/2013  . High blood pressure 04/05/2013  . Knee pain, acute 04/05/2013    Charletta CousinBarnhill, Amy Dionicio StallBeth Dixon, OTR/L 06/11/2018, 11:37 AM  [facility] [address] [place], [place], [address] Phone: [phone]   Fax:  [phone]  Name: Gerline LegacyMichael Matassa MRN: [id number] Date of Birth: [birth year]

## 2018-06-12 MED FILL — CLOPIDOGREL 75 MG TABLET: 75 | 30 days supply | Qty: 30 | Fill #4

## 2018-06-12 MED FILL — ATORVASTATIN 80 MG TABLET: 80 | 30 days supply | Qty: 30 | Fill #4

## 2018-06-14 ENCOUNTER — Encounter: Payer: Self-pay | Admitting: Adult Health

## 2018-06-14 ENCOUNTER — Ambulatory Visit: Payer: Medicaid Other | Admitting: Adult Health

## 2018-06-14 VITALS — BP 150/63 | HR 69 | Ht 72.0 in | Wt 181.0 lb

## 2018-06-14 DIAGNOSIS — I6521 Occlusion and stenosis of right carotid artery: Secondary | ICD-10-CM

## 2018-06-14 DIAGNOSIS — E785 Hyperlipidemia, unspecified: Secondary | ICD-10-CM

## 2018-06-14 DIAGNOSIS — Z7689 Persons encountering health services in other specified circumstances: Secondary | ICD-10-CM | POA: Diagnosis not present

## 2018-06-14 DIAGNOSIS — I6389 Other cerebral infarction: Secondary | ICD-10-CM

## 2018-06-14 DIAGNOSIS — R2689 Other abnormalities of gait and mobility: Secondary | ICD-10-CM

## 2018-06-14 DIAGNOSIS — I10 Essential (primary) hypertension: Secondary | ICD-10-CM

## 2018-06-14 DIAGNOSIS — I69398 Other sequelae of cerebral infarction: Secondary | ICD-10-CM

## 2018-06-14 NOTE — Progress Notes (Signed)
[facility] [address] [place]. [place] [id number] [phone]       OFFICE FOLLOW UP NOTE  [name] Date of Birth:  [birth year] Medical Record Number:  [id number]   Reason for Referral:  hospital stroke follow up  CHIEF COMPLAINT:  Chief Complaint  Patient presents with  . 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 History obtained from patient and chart review. Reviewed all radiology images and labs personally.  Nathan Hernandez is a 58 y.o. male with history of HLD, marijuana use, 2-3 beers/day who presented with L HP and progressive L sided numbness and tingling x 6 months.  Per notes, patient has been noticing this for the past 6 months but with sudden changes recently prior to admission.  His family was not aware of the symptoms and once it became aware, convince him to go to the hospital.  He typically does not go to doctors or take any prescription medications.  CT head reviewed and showed likely right brain infarct with signs of loss of gray-white differentiation and chronic bilateral BG with balance.  MRI brain reviewed and showed multiple small right cerebral watershed infarcts in various ages, chronic bilateral BG and pontine infarcts.  MRA head showed a normal distal right ICA with narrowing, diffuse cervical spondylosis with neuroforaminal stenosis and mild spinal stenosis C3-4.  CTA head and neck showed severe near occlusion right ICA bifurcation 1 cm in length with decreased flow of right ACA and right MCA along with carotid siphon right 75% and left 50%, BA 50% and left VA 50%.  2D echo showed an EF of 60 to 65%.  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   No antithrombotic prior to admission and recommended DAPT with aspirin 325 mg and Plavix due to ICA stenosis.  LDL 89 and recommended increase of atorvastatin to 80 mg daily.  HTN stable during admission and recommended SBP 120-150.  Patient was discharged home in stable condition with recommendations of home health PT/OT with carotid enterectomy scheduled for 11/24/2017. Patient underwent right trans-carotid artery revascularization on 11/24/2017 which showed 95% right internal carotid artery stenosis stented to residual less than 10% stenosis and tolerated procedure well.  Later that evening after procedure, patient was complaining of LLQ abdominal pain 10/10 and was found to be hypotensive with SBP 80 and tachycardic.  CT was obtained with concern for retroperitoneal hematoma with compression of bladder immobilization of the left kidney.  Patient was taken back to the OR and underwent left groin exploration and evacuation of left retroperitoneal hematoma and ligation of tributary branch of the EIA.  Patient stable through rest of admission and was discharged home in stable condition. 04/04/2018 visit: Patient is being seen today for hospital follow-up.  He does continue to have left-sided weakness with slight improvement.  He was unable to obtain therapies after discharge due to lack of insurance but has recently obtained Medicaid approval and plans on scheduling appointments with PT/OT.  He does complain of left upper extremity pain along with a sensation of tightness and swelling which has been limiting his movement and activity.  He currently is wearing a left wrist brace as he  feels as though flexing his wrist is making the pain worse.  He also has recently been wearing a arm sling to immobilize his left arm is also helps to provide relief from the pain.  He is currently using a cane but is able to ambulate without any recent falls.  He continues aspirin and Plavix without side effects of bleeding or  bruising.  He does have follow-up appointment with vascular surgery in 07/2018 for follow-up appointment and repeat carotid ultrasound.  It was recommended to continue on DAPT until that time.  Continues to take atorvastatin 80 mg without side effects myalgias.  Blood pressure today satisfactory 118/72.  No further concerns at this time.  Denies new or worsening stroke/TIA symptoms.  Interval history 06/14/2018: Patient is being seen today for 66-month follow-up visit.  He continues on aspirin 325 mg and Plavix without side effects of bleeding or bruising.  Continues on atorvastatin without side effects myalgias.  Blood pressure today 150/83.  Patient continued to have left upper extremity pain and was referred to orthopedics by his PCP.  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 He does state great benefit with injection and continued therapy sessions.  He feels as though he has a greater range of motion and has been making progress as far as weakness.  He does continue to use baclofen 5mg  three times daily. It was recommended to increase to 10mg  TID after patient called stating this medication was not providing benefit but he had not done this due to confusion of patient with instruction label on medication bottle. Able to bend fingers more and use hand better as well.  He does continue to still have some balance difficulties and uses cane for assistance.  He is questioning whether he should return to PT for additional sessions.  No further concerns at this time.  Denies new or worsening stroke/TIA symptoms.    ROS:   14 system review of systems performed and negative with exception of blurred vision, joint pain and joint swelling  PMH:  Past Medical History:  Diagnosis Date  . HLD (hyperlipidemia)   . Hypertension   . Osteoarthritis   . Polysubstance abuse (HCC)   . Stroke [facility])     PSH:   Past Surgical History:  Procedure Laterality Date  . GROIN DEBRIDEMENT Left 11/24/2017   Procedure: EVACUATION OF LEFT GROIN HEMATOMA, REPAIR OF LEFT FEMORAL ARTERY BRACH;  Surgeon: Larina Earthly, MD;  Location: [facility] OR;  Service: Vascular;  Laterality: Left;  . KNEE SURGERY    . TRANSCAROTID ARTERY REVASCULARIZATION (TCAR)  11/24/2017  . TRANSCAROTID ARTERY REVASCULARIZATION Right 11/24/2017   Procedure: TRANSCAROTID ARTERY REVASCULARIZATION;  Surgeon: Nada Libman, MD;  Location: [facility] OR;  Service: Vascular;  Laterality: Right;    Social History:  Social History   Socioeconomic History  . Marital status: Divorced    Spouse name: Not on file  . Number of children: Not on file  . Years of education: Not on file  . Highest education level: Not on file  Occupational History  . Not on file  Social Needs  . Financial resource strain: Not on file  . Food insecurity:    Worry: Not on file    Inability: Not on file  . Transportation needs:    Medical: Not on file    Non-medical: Not on file  Tobacco Use  . Smoking status:  Light Tobacco Smoker    Packs/day: 1.00    Types: Cigars    Last attempt to quit: 11/2017    Years since quitting: 0.6  . Smokeless tobacco: Never Used  Substance and Sexual Activity  . Alcohol use: Yes    Alcohol/week: 1.0 standard drinks    Types: 1 Cans of beer per week    Comment: 2 to 3 per week  . Drug use: Yes    Frequency: 7.0 times per week    Types: Marijuana    Comment: once a month usually for pain   . Sexual activity: Not on file  Lifestyle  . Physical activity:    Days per week: Not on file    Minutes per session: Not on file  . Stress: Not on file  Relationships  . Social connections:    Talks on phone: Not on file    Gets together: Not on file    Attends religious service: Not on file    Active member of club or organization: Not on file    Attends meetings of clubs or organizations: Not on file    Relationship status: Not on file   . Intimate partner violence:    Fear of current or ex partner: Not on file    Emotionally abused: Not on file    Physically abused: Not on file    Forced sexual activity: Not on file  Other Topics Concern  . Not on file  Social History Narrative  . Not on file    Family History:  Family History  Problem Relation Age of Onset  . Diabetes Mother   . Hypertension Mother   . Hypertension Father   . Diabetes Father     Medications:   Current Outpatient Medications on File Prior to Visit  Medication Sig Dispense Refill  . aspirin 325 MG tablet Take 1 tablet (325 mg total) by mouth daily. 30 tablet 5  . atorvastatin (LIPITOR) 80 MG tablet Take 1 tablet (80 mg total) by mouth daily at 6 PM. 30 tablet 5  . Baclofen 5 MG TABS Take 10 mg by mouth 3 (three) times daily. 180 tablet 3  . clopidogrel (PLAVIX) 75 MG tablet Take 1 tablet (75 mg total) by mouth daily. 30 tablet 5  . nicotine (NICODERM CQ - DOSED IN MG/24 HOURS) 21 mg/24hr patch Place 1 patch (21 mg total) onto the skin daily. 28 patch 0  . NONFORMULARY OR COMPOUNDED ITEM Please evaluate and treat for outpatient physical therapy.  Diagnosis-CVA 1 each 0  . Vitamin D, Ergocalciferol, (DRISDOL) 1.25 MG (50000 UT) CAPS capsule Take 1 capsule (50,000 Units total) by mouth every 7 (seven) days. 12 capsule 0   No current facility-administered medications on file prior to visit.     Allergies:  No Known Allergies   Physical Exam  Vitals:   06/14/18 1235  BP: (!) 150/63  Pulse: 69  Weight: 181 lb (82.1 kg)  Height: 6' (1.829 m)   Body mass index is 24.55 kg/m. No exam data present  General: well developed, well nourished, pleasant middle-aged African-American male, seated, in no evident distress Head: head normocephalic and atraumatic.   Neck: supple with no carotid or supraclavicular bruits Cardiovascular: regular rate and rhythm, no murmurs Musculoskeletal: no deformity Skin:  no rash/petichiae Vascular:  Normal  pulses all extremities  Neurologic Exam Mental Status: Awake and fully alert. Oriented to place and time. Recent and remote memory intact. Attention span, concentration and fund of knowledge  appropriate. Mood and affect appropriate.  Cranial Nerves: Pupils equal, briskly reactive to light. Extraocular movements full without nystagmus. Visual fields full to confrontation. Hearing intact. Facial sensation intact. Face, tongue, palate moves normally and symmetrically.  Motor:  LUE: 4/5 with mildly weak grip strength;  LLE: 4+/5  Sensory.: intact to touch , pinprick , position and vibratory sensation.  Coordination: Rapid alternating movements normal on right side. Finger-to-nose and heel-to-shin performed accurately on right side.  Orbits right arm over left arm.  Decreased left hand finger dexterity. Gait and Station: Arises from chair without difficulty. Stance is normal.  Mild hemiplegic gait with stiffened left leg with use of cane Reflexes: 2+ LUE; 1+ throughout all other extremities. Toes downgoing.     Diagnostic Data (Labs, Imaging, Testing)  CT HEAD WO CONTRAST 11/20/2017 IMPRESSION: 1. Loss of the normal gray-white matter differentiation in the right pre and postcentral gyri, suspicious for subacute infarct given clinical history. 2. Chronic appearing lacunar infarcts in the bilateral basal ganglia and right pons.  MR BRAIN WO CONTRAST 11/20/2017 IMPRESSION: 1. 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 2. 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 3. Diffusely abnormal appearance of the distal cervical and proximal intracranial portions of the right ICA including mild diffuse narrowing. Head and neck CTA is recommended to further evaluate for a possible dissection or flow limiting proximal stenosis. 4. Diffuse cervical spondylosis with  moderate to severe multilevel neural foraminal stenosis as above. 5. Mild spinal stenosis at C3-4.  CT ANGIO HEAD W OR WO CONTRAST CT ANGIO NECK W OR WO CONTRAST 11/20/2017 IMPRESSION: 1. 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 Secondary downstream attenuation with decreased flow throughout the right ACA and MCA territories as compared to the left. 2. Prominent carotid siphon atherosclerotic change with associated stenoses of up to 75% on the right and 50% on the left. 3. 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 4. 50% stenosis at the origin of the left vertebral artery. 5. Fetal type right PCA.  ECHOCARDIOGRAM 11/21/2017 Impressions: - LVEF 60-65%, moderate LVH, normal wall motion, grade 1 DD,   indeterminate LV filling pressure, trivial MR, normal LA size,   trivial TR, RVSP 25 mmHg, normal IVC.     ASSESSMENT: Nathan Hernandez is a 58 y.o. 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  Patient underwent right carotid enterectomy with stent placement on 11/24/2017 with complication of left retroperitoneal hematoma which was evacuated and EIA ligated. Vascular risk factors include HLD, tobacco use, THC use and alcohol use.  Patient is being seen today for follow-up visit and does continue to have left hemiparesis but does endorse improvement.    PLAN: -Continue aspirin 325 mg daily and clopidogrel 75 mg daily  and atorvastatin 80 mg for secondary stroke prevention -f/u with vasc surgery 07/2018 for repeat carotid duplex along with discussion regarding DAPT duration -F/u with PCP regarding your HLD and HTN management -Increase baclofen to 10 mg 3 times daily for muscle spasticity -Referral placed for additional PT  sessions due to gait disturbance along with recommendation of continued OT for left upper extremity strengthening and range of motion -continue to monitor BP at home -advised to  continue to stay active and maintain a healthy diet -Maintain strict control of hypertension with blood pressure goal below 130/90, diabetes with hemoglobin A1c goal below 6.5% and cholesterol with LDL cholesterol (bad cholesterol) goal below 70 mg/dL. I also advised the patient to eat a healthy diet with plenty of whole grains, cereals, fruits and vegetables, exercise regularly and maintain ideal body weight.  Follow up in 6 months or call earlier if needed   Greater than 50% of time during this 25 minute visit was spent on counseling,explanation of diagnosis of right MCA watershed infarcts, reviewing risk factor management of right ICA occlusion s/p endarterectomy with stent placement, HLD, HTN and tobacco/substance abuse, planning of further management, discussion with patient and family and coordination of care    George Hugh, AGNP-BC  [facility] [address] [place], [place] [address]  Phone [phone] Fax [phone] Note: This document was prepared with digital dictation and possible smart phrase technology. Any transcriptional errors that result from this process are unintentional.

## 2018-06-14 NOTE — Patient Instructions (Addendum)
Continue aspirin 325 mg daily and clopidogrel 75 mg daily  and lipitor  for secondary stroke prevention  Continue to follow up with PCP regarding cholesterol and blood pressure management   Increase baclofen from 5mg  three times daily to 10mg  three times daily for continued spasticity  Follow up with orthopedics if needed for shoulder pain  Follow up with vascular surgery as scheduled in 07/2018  Continue therapies sessions for continued weakness and range of motion of left arm - you will be called by physical therapy to restart sessions  Continue to monitor blood pressure at home  Maintain strict control of hypertension with blood pressure goal below 130/90, diabetes with hemoglobin A1c goal below 6.5% and cholesterol with LDL cholesterol (bad cholesterol) goal below 70 mg/dL. I also advised the patient to eat a healthy diet with plenty of whole grains, cereals, fruits and vegetables, exercise regularly and maintain ideal body weight.  Followup in the future with me in 6 months or call earlier if needed       Thank you for coming to see [place] at [facility]. I hope we have been able to provide you high quality care today.  You may receive a patient satisfaction survey over the next few weeks. We would appreciate your feedback and comments so that we may continue to improve ourselves and the health of our patients.

## 2018-06-22 MED FILL — BACLOFEN 5 MG TABS: 5 | 30 days supply | Qty: 90 | Fill #1

## 2018-06-22 MED FILL — VIT D2 1.25 MG (50,000 UNIT: 1.25 MG | 28 days supply | Qty: 4 | Fill #2

## 2018-06-22 NOTE — Progress Notes (Signed)
I agree with the above plan 

## 2018-07-11 ENCOUNTER — Encounter (HOSPITAL_COMMUNITY): Payer: Self-pay

## 2018-07-11 ENCOUNTER — Ambulatory Visit: Payer: Self-pay | Admitting: Family

## 2018-07-11 MED FILL — CLOPIDOGREL 75 MG TABLET: 75 | 30 days supply | Qty: 30 | Fill #5

## 2018-07-11 MED FILL — ATORVASTATIN 80 MG TABLET: 80 | 30 days supply | Qty: 30 | Fill #5

## 2018-07-12 ENCOUNTER — Ambulatory Visit (HOSPITAL_COMMUNITY)
Admission: RE | Admit: 2018-07-12 | Discharge: 2018-07-12 | Disposition: A | Discharge status: Home / Self Care | Payer: Medicaid Other | Source: Ambulatory Visit | Attending: Surgery | Admitting: Surgery

## 2018-07-12 ENCOUNTER — Ambulatory Visit (INDEPENDENT_AMBULATORY_CARE_PROVIDER_SITE_OTHER): Payer: Medicaid Other | Admitting: Physician Assistant

## 2018-07-12 ENCOUNTER — Encounter: Payer: Self-pay | Admitting: Family

## 2018-07-12 VITALS — BP 121/81 | HR 61 | Temp 98.4°F | Resp 16 | Ht 72.0 in | Wt 181.0 lb

## 2018-07-12 DIAGNOSIS — I6529 Occlusion and stenosis of unspecified carotid artery: Secondary | ICD-10-CM | POA: Insufficient documentation

## 2018-07-12 DIAGNOSIS — I6389 Other cerebral infarction: Secondary | ICD-10-CM

## 2018-07-12 DIAGNOSIS — Z7689 Persons encountering health services in other specified circumstances: Secondary | ICD-10-CM | POA: Diagnosis not present

## 2018-07-12 DIAGNOSIS — I6523 Occlusion and stenosis of bilateral carotid arteries: Secondary | ICD-10-CM

## 2018-07-12 NOTE — Progress Notes (Signed)
Established Carotid Patient   History of Present Illness   Nathan Hernandez is a 58 y.o. ([birth year]) male who presents to go over carotid duplex.  He is status post right TCAR performed by Dr. Myra GianottiBrabham on 11/24/2017.  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  Postoperatively during hospitalization he developed left flank pain and was found to have a large hematoma and retroperitoneal bleed.  He was taken to the operating room by Dr. early that evening and found that the venous cannulation was through a tributary branch of the left external iliac artery which was ligated.  He denies any further strokelike symptoms including slurring speech, changes in vision or further one-sided weakness.  He is still working with physical therapy on strengthening left arm and left hand.  He requires a cane for ambulation.  He is still taking aspirin, Plavix, and statin daily.  Current Outpatient Medications  Medication Sig Dispense Refill  . aspirin 325 MG tablet Take 1 tablet (325 mg total) by mouth daily. 30 tablet 5  . atorvastatin (LIPITOR) 80 MG tablet Take 1 tablet (80 mg total) by mouth daily at 6 PM. 30 tablet 5  . Baclofen 5 MG TABS Take 10 mg by mouth 3 (three) times daily. 180 tablet 3  . clopidogrel (PLAVIX) 75 MG tablet Take 1 tablet (75 mg total) by mouth daily. 30 tablet 5  . nicotine (NICODERM CQ - DOSED IN MG/24 HOURS) 21 mg/24hr patch Place 1 patch (21 mg total) onto the skin daily. 28 patch 0  . NONFORMULARY OR COMPOUNDED ITEM Please evaluate and treat for outpatient physical therapy.  Diagnosis-CVA 1 each 0  . Vitamin D, Ergocalciferol, (DRISDOL) 1.25 MG (50000 UT) CAPS capsule Take 1 capsule (50,000 Units total) by mouth every 7 (seven) days. 12 capsule 0   No current facility-administered medications for this visit.     On ROS today: 10 system ROS otherwise negative   Physical Examination   Vitals:   07/12/18 1345 07/12/18 1347  BP: 127/77 121/81  Pulse: 61   Resp: 16   Temp: 98.4 F (36.9 C)   TempSrc: Oral   SpO2: 95%   Weight: 181 lb (82.1 kg)   Height: 6' (1.829 m)    Body mass index is 24.55 kg/m.  General Alert, O x 3, WD, NAD  Neck Supple, mid-line trachea, Right neck incision healed  Pulmonary Sym exp, good B air movt, CTA B  Cardiac RRR, Nl S1, S2  Vascular Vessel Right Left  Radial Palpable Palpable  Carotid Palpable, No Bruit Palpable, No Bruit  Aorta Not palpable N/A    Gastro- intestinal soft, non-distended, non-tender to palpation,   Musculo- skeletal  left groin incision healed; no active tissue ischemia bilateral lower extremities  Neurologic Cranial nerves 2-12 intact; asymmetrical grip strength right greater than left    Non-Invasive Vascular Imaging   B Carotid Duplex (07/12/18):   R ICA stenosis:  1-39%; patent stent without in-stent stenosis  R VA:  patent and antegrade  L ICA stenosis:  40-59%  L VA:  patent and antegrade   Medical Decision Making   Nathan Hernandez is a 58 y.o. male who presents to go over carotid duplex 6 months status post right T CAR due to symptomatic carotid stenosis and right CVA   Carotid duplex demonstrates a patent right carotid stent without in-stent stenosis  Velocities left ICA only minimally increased however patient now  and 40 to 59% category  We will continue aspirin, Plavix, and statin daily for now  Recheck carotid duplex in 6 months   Emilie Rutter PA-C [facility] Office: [phone]  Clinic MD: Dr. Darrick Penna

## 2018-07-16 ENCOUNTER — Encounter: Payer: Self-pay | Admitting: Occupational Therapy

## 2018-07-16 NOTE — Therapy (Unsigned)
[facility] [address] [place], [place], [address] Phone: [phone]   Fax:  [phone]  Patient Details  Name: Nathan Hernandez MRN: [id number] Date of Birth: 1961-01-21 Referring Provider:  No ref. provider found  Pt was seen for out-pt neuro rehab OT for 8 out of 15 scheduled visits and did not return or schedule f/u appointments and is therefore d/c from OT.  Mariam Dollar Dionicio Stall, OTR/L 07/16/2018, 11:27 AM  [facility] [address] [place], [place], [address] Phone: [phone]   Fax:  [phone]

## 2018-07-25 DIAGNOSIS — Z0289 Encounter for other administrative examinations: Secondary | ICD-10-CM

## 2018-08-06 MED FILL — BACLOFEN 5 MG TABS: 5 | 30 days supply | Qty: 90 | Fill #2

## 2018-08-13 MED FILL — ATORVASTATIN 80 MG TABLET: 80 | 30 days supply | Qty: 30 | Fill #1

## 2018-08-13 MED FILL — CLOPIDOGREL 75 MG TABLET: 75 | 30 days supply | Qty: 30 | Fill #1

## 2018-09-05 MED FILL — BACLOFEN 5 MG TABS: 5 | 30 days supply | Qty: 180 | Fill #3

## 2018-09-12 MED FILL — CLOPIDOGREL 75 MG TABLET: 75 | 90 days supply | Qty: 90 | Fill #2

## 2018-09-12 MED FILL — ATORVASTATIN 80 MG TABLET: 80 | 60 days supply | Qty: 60 | Fill #2

## 2018-11-22 ENCOUNTER — Other Ambulatory Visit: Payer: Self-pay | Admitting: Physician Assistant

## 2018-11-22 DIAGNOSIS — E785 Hyperlipidemia, unspecified: Secondary | ICD-10-CM

## 2018-12-03 ENCOUNTER — Ambulatory Visit (INDEPENDENT_AMBULATORY_CARE_PROVIDER_SITE_OTHER): Payer: Medicaid Other | Admitting: Primary Care

## 2018-12-03 ENCOUNTER — Other Ambulatory Visit: Payer: Self-pay

## 2018-12-03 ENCOUNTER — Encounter (INDEPENDENT_AMBULATORY_CARE_PROVIDER_SITE_OTHER): Payer: Self-pay | Admitting: Primary Care

## 2018-12-03 VITALS — BP 116/70 | HR 68 | Temp 98.2°F | Ht 72.0 in | Wt 170.0 lb

## 2018-12-03 DIAGNOSIS — D649 Anemia, unspecified: Secondary | ICD-10-CM | POA: Diagnosis not present

## 2018-12-03 DIAGNOSIS — Z720 Tobacco use: Secondary | ICD-10-CM

## 2018-12-03 DIAGNOSIS — I10 Essential (primary) hypertension: Secondary | ICD-10-CM

## 2018-12-03 DIAGNOSIS — E782 Mixed hyperlipidemia: Secondary | ICD-10-CM

## 2018-12-03 DIAGNOSIS — E785 Hyperlipidemia, unspecified: Secondary | ICD-10-CM | POA: Diagnosis not present

## 2018-12-03 DIAGNOSIS — Z760 Encounter for issue of repeat prescription: Secondary | ICD-10-CM

## 2018-12-03 MED ORDER — ATORVASTATIN CALCIUM 80 MG PO TABS: 80.0000 mg | ORAL_TABLET | Freq: Every day | ORAL | 3 refills | Status: DC

## 2018-12-03 MED FILL — ATORVASTATIN 80 MG TABLET: 80 | 90 days supply | Qty: 90 | Fill #0

## 2018-12-03 NOTE — Progress Notes (Signed)
Patient states He had a cortisone shot in November of 2019. He is now having left shoulder stiffness again.

## 2018-12-03 NOTE — Progress Notes (Signed)
Established Patient Office Visit  Subjective:  Patient ID: Nathan Hernandez, male    DOB: 04-26-1961  Age: 58 y.o. MRN: [id number]  CC:  Chief Complaint  Patient presents with  . Medication Refill  . fasting labs    HPI Nathan Hernandez presents for medication refill of Lipitor 80 mg at night there is a question if he needs to continue Plavix 75 mg daily will defer that to neurology or Vascular and vein specialist vascular and vein specialist history of CVA with left-sided weakness had physical therapy which improved his range of motion on the left side.  Patient blood pressure was elevated upon arrival rechecked and is unremarkable.  Past Medical History:  Diagnosis Date  . HLD (hyperlipidemia)   . Hypertension   . Osteoarthritis   . Polysubstance abuse ([place])   . Stroke [facility])     Past Surgical History:  Procedure Laterality Date  . GROIN DEBRIDEMENT Left 11/24/2017   Procedure: EVACUATION OF LEFT GROIN HEMATOMA, REPAIR OF LEFT FEMORAL ARTERY BRACH;  Surgeon: Rosetta Posner, MD;  Location: [facility];  Service: Vascular;  Laterality: Left;  . KNEE SURGERY    . TRANSCAROTID ARTERY REVASCULARIZATION (TCAR)  11/24/2017  . TRANSCAROTID ARTERY REVASCULARIZATION Right 11/24/2017   Procedure: TRANSCAROTID ARTERY REVASCULARIZATION;  Surgeon: Serafina Mitchell, MD;  Location: [facility] OR;  Service: Vascular;  Laterality: Right;    Family History  Problem Relation Age of Onset  . Diabetes Mother   . Hypertension Mother   . Hypertension Father   . Diabetes Father     Social History   Socioeconomic History  . Marital status: Divorced    Spouse name: Not on file  . Number of children: Not on file  . Years of education: Not on file  . Highest education level: Not on file  Occupational History  . Not on file  Social Needs  . Financial resource strain: Not on file  . Food insecurity    Worry: Not on file    Inability: Not on file  . Transportation needs    Medical: Not on file     Non-medical: Not on file  Tobacco Use  . Smoking status: Light Tobacco Smoker    Packs/day: 1.00    Types: Cigars  . Smokeless tobacco: Never Used  Substance and Sexual Activity  . Alcohol use: Yes    Alcohol/week: 1.0 standard drinks    Types: 1 Cans of beer per week    Comment: 2 to 3 per week  . Drug use: Yes    Frequency: 7.0 times per week    Types: Marijuana    Comment: once a month usually for pain   . Sexual activity: Not on file  Lifestyle  . Physical activity    Days per week: Not on file    Minutes per session: Not on file  . Stress: Not on file  Relationships  . Social Herbalist on phone: Not on file    Gets together: Not on file    Attends religious service: Not on file    Active member of club or organization: Not on file    Attends meetings of clubs or organizations: Not on file    Relationship status: Not on file  . Intimate partner violence    Fear of current or ex partner: Not on file    Emotionally abused: Not on file    Physically abused: Not on file    Forced sexual activity: Not  on file  Other Topics Concern  . Not on file  Social History Narrative  . Not on file    Outpatient Medications Prior to Visit  Medication Sig Dispense Refill  . aspirin 325 MG tablet Take 1 tablet (325 mg total) by mouth daily. 30 tablet 5  . clopidogrel (PLAVIX) 75 MG tablet Take 1 tablet (75 mg total) by mouth daily. 30 tablet 5  . nicotine (NICODERM CQ - DOSED IN MG/24 HOURS) 21 mg/24hr patch Place 1 patch (21 mg total) onto the skin daily. (Patient not taking: Reported on 12/03/2018) 28 patch 0  . atorvastatin (LIPITOR) 80 MG tablet Take 1 tablet (80 mg total) by mouth daily at 6 PM. (Patient not taking: Reported on 12/03/2018) 30 tablet 5  . Baclofen 5 MG TABS Take 10 mg by mouth 3 (three) times daily. 180 tablet 3  . NONFORMULARY OR COMPOUNDED ITEM Please evaluate and treat for outpatient physical therapy.  Diagnosis-CVA 1 each 0  . Vitamin D,  Ergocalciferol, (DRISDOL) 1.25 MG (50000 UT) CAPS capsule Take 1 capsule (50,000 Units total) by mouth every 7 (seven) days. 12 capsule 0   No facility-administered medications prior to visit.     No Known Allergies  ROS Review of Systems  Constitutional: Negative.   Eyes: Positive for visual disturbance.  Respiratory: Negative.   Cardiovascular: Positive for palpitations.  Gastrointestinal: Negative.   Musculoskeletal:       Shoulder  And knee pain left side hx of CVA  Hematological: Negative.   Psychiatric/Behavioral: Negative.       Objective:    Physical Exam  Constitutional: He is oriented to person, place, and time. He appears well-developed and well-nourished.  Cardiovascular: Normal rate and regular rhythm.  Pulmonary/Chest: Breath sounds normal.  Abdominal: Soft. Bowel sounds are normal.  Musculoskeletal:     Comments: Left shoulder decrease ROM 45 degrees states was better with therapy and cortisone njections  Neurological: He is oriented to person, place, and time.  Skin: Skin is warm.  Psychiatric: He has a normal mood and affect.    BP 116/70 (BP Location: Left Arm, Patient Position: Sitting, Cuff Size: Normal)   Pulse 68   Temp 98.2 F (36.8 C) (Oral)   Ht 6' (1.829 m)   Wt 170 lb (77.1 kg)   SpO2 94%   BMI 23.06 kg/m  Wt Readings from Last 3 Encounters:  12/03/18 170 lb (77.1 kg)  07/12/18 181 lb (82.1 kg)  06/14/18 181 lb (82.1 kg)     Health Maintenance Due  Topic Date Due  . Fecal DNA (Cologuard)  06/21/2011    There are no preventive care reminders to display for this patient.  No results found for: TSH Lab Results  Component Value Date   WBC 6.1 04/10/2018   HGB 13.9 04/10/2018   HCT 42.2 04/10/2018   MCV 77 (L) 04/10/2018   PLT 354 04/10/2018   Lab Results  Component Value Date   NA 139 04/10/2018   K 4.1 04/10/2018   CO2 20 04/10/2018   GLUCOSE 92 04/10/2018   BUN 6 04/10/2018   CREATININE 0.78 04/10/2018   BILITOT 0.6  11/20/2017   ALKPHOS 75 11/20/2017   AST 24 11/20/2017   ALT 18 11/20/2017   PROT 7.3 11/20/2017   ALBUMIN 4.1 11/20/2017   CALCIUM 9.4 04/10/2018   ANIONGAP 6 11/25/2017   Lab Results  Component Value Date   CHOL 151 04/10/2018   Lab Results  Component Value Date  HDL 51 04/10/2018   Lab Results  Component Value Date   LDLCALC 80 04/10/2018   Lab Results  Component Value Date   TRIG 98 04/10/2018   Lab Results  Component Value Date   CHOLHDL 3.0 04/10/2018   Lab Results  Component Value Date   HGBA1C 5.6 11/21/2017      Assessment & Plan:   Problem List Items Addressed This Visit    High blood pressure   Relevant Medications   atorvastatin (LIPITOR) 80 MG tablet   Other Relevant Orders   CBC with Differential   CMP14+EGFR   HLD (hyperlipidemia)   Relevant Medications   atorvastatin (LIPITOR) 80 MG tablet   Dyslipidemia   Relevant Medications   atorvastatin (LIPITOR) 80 MG tablet   Other Relevant Orders   Lipid Panel    Other Visit Diagnoses    Anemia, unspecified type    -  Primary   Tobacco abuse        .  Meds ordered this encounter  Medications  . atorvastatin (LIPITOR) 80 MG tablet    Sig: Take 1 tablet (80 mg total) by mouth daily at 6 PM.    Dispense:  30 tablet    Refill:  3   Nathan Hernandez was seen today for medication refill and fasting labs.  Diagnoses and all orders for this visit:  Anemia, unspecified type Hx of will check levels for anemia and treat accordingly  Essential hypertension Provide information on the DASH diet to help impede hypertension.  Currently on no medication BP today after recheck unremarkable. Denies shortness of breath, headaches, chest pain or lower extremity edema -     CBC with Differential -     CMP14+EGFR  Dyslipidemia HLD (hyperlipidemia) -     atorvastatin (LIPITOR) 80 MG tablet; Take 1 tablet (80 mg total) by mouth daily at 6 PM. Fasting lipids  history of CVA. -     Lipid Panel  Tobacco  abuse Smoking cessation instruction/counseling given: Not ready to quit: yes  Counseling given:     Follow-up: Return in about 3 months (around 03/05/2019) for hyperlipidemia.    Kerin Perna, NP

## 2018-12-03 NOTE — Patient Instructions (Signed)

## 2018-12-04 ENCOUNTER — Telehealth: Payer: Self-pay

## 2018-12-04 LAB — CBC WITH DIFFERENTIAL/PLATELET
Basophils Absolute: 0.1 10*3/uL (ref 0.0–0.2)
Basos: 1 %
EOS (ABSOLUTE): 0.2 10*3/uL (ref 0.0–0.4)
Eos: 3 %
Hematocrit: 46.1 % (ref 37.5–51.0)
Hemoglobin: 15.3 g/dL (ref 13.0–17.7)
Immature Grans (Abs): 0 10*3/uL (ref 0.0–0.1)
Immature Granulocytes: 0 %
Lymphocytes Absolute: 2.1 10*3/uL (ref 0.7–3.1)
Lymphs: 31 %
MCH: 28.6 pg (ref 26.6–33.0)
MCHC: 33.2 g/dL (ref 31.5–35.7)
MCV: 86 fL (ref 79–97)
Monocytes Absolute: 1 10*3/uL — ABNORMAL HIGH (ref 0.1–0.9)
Monocytes: 15 %
Neutrophils Absolute: 3.4 10*3/uL (ref 1.4–7.0)
Neutrophils: 50 %
Platelets: 331 10*3/uL (ref 150–450)
RBC: 5.35 x10E6/uL (ref 4.14–5.80)
RDW: 15.7 % — ABNORMAL HIGH (ref 11.6–15.4)
WBC: 6.7 10*3/uL (ref 3.4–10.8)

## 2018-12-04 LAB — LIPID PANEL
Chol/HDL Ratio: 2.7 ratio (ref 0.0–5.0)
Cholesterol, Total: 212 mg/dL — ABNORMAL HIGH (ref 100–199)
HDL: 80 mg/dL (ref >39)
LDL Calculated: 107 mg/dL — ABNORMAL HIGH (ref 0–99)
Triglycerides: 123 mg/dL (ref 0–149)
VLDL Cholesterol Cal: 25 mg/dL (ref 5–40)

## 2018-12-04 LAB — CMP14+EGFR
ALT: 20 IU/L (ref 0–44)
AST: 24 IU/L (ref 0–40)
Albumin/Globulin Ratio: 1.9 (ref 1.2–2.2)
Albumin: 5 g/dL — ABNORMAL HIGH (ref 3.8–4.9)
Alkaline Phosphatase: 88 IU/L (ref 39–117)
BUN/Creatinine Ratio: 7 — ABNORMAL LOW (ref 9–20)
BUN: 5 mg/dL — ABNORMAL LOW (ref 6–24)
Bilirubin Total: 0.5 mg/dL (ref 0.0–1.2)
CO2: 20 mmol/L (ref 20–29)
Calcium: 9.5 mg/dL (ref 8.7–10.2)
Chloride: 101 mmol/L (ref 96–106)
Creatinine, Ser: 0.75 mg/dL — ABNORMAL LOW (ref 0.76–1.27)
GFR calc Af Amer: 118 mL/min/{1.73_m2} (ref >59)
GFR calc non Af Amer: 102 mL/min/{1.73_m2} (ref >59)
Globulin, Total: 2.6 g/dL (ref 1.5–4.5)
Glucose: 85 mg/dL (ref 65–99)
Potassium: 4.3 mmol/L (ref 3.5–5.2)
Sodium: 140 mmol/L (ref 134–144)
Total Protein: 7.6 g/dL (ref 6.0–8.5)

## 2018-12-04 NOTE — Telephone Encounter (Signed)
Patient is aware that all labs were within normal limits. Nat Christen, CMA

## 2018-12-04 NOTE — Telephone Encounter (Signed)
-----   Message from Kerin Perna, NP sent at 12/04/2018  8:45 AM EDT ----- Let patient know labs are wnl

## 2018-12-13 ENCOUNTER — Ambulatory Visit: Payer: Medicaid Other | Admitting: Adult Health

## 2018-12-24 ENCOUNTER — Encounter: Payer: Self-pay | Admitting: Adult Health

## 2018-12-24 ENCOUNTER — Other Ambulatory Visit: Payer: Self-pay

## 2018-12-24 ENCOUNTER — Ambulatory Visit: Payer: Medicaid Other | Admitting: Adult Health

## 2018-12-24 VITALS — BP 112/68 | HR 72 | Temp 98.2°F | Ht 72.0 in | Wt 167.4 lb

## 2018-12-24 DIAGNOSIS — E785 Hyperlipidemia, unspecified: Secondary | ICD-10-CM

## 2018-12-24 DIAGNOSIS — I10 Essential (primary) hypertension: Secondary | ICD-10-CM

## 2018-12-24 DIAGNOSIS — I6389 Other cerebral infarction: Secondary | ICD-10-CM

## 2018-12-24 DIAGNOSIS — I6521 Occlusion and stenosis of right carotid artery: Secondary | ICD-10-CM

## 2018-12-24 DIAGNOSIS — I639 Cerebral infarction, unspecified: Secondary | ICD-10-CM

## 2018-12-24 MED ORDER — EZETIMIBE 10 MG PO TABS: 10.0000 mg | ORAL_TABLET | Freq: Every day | ORAL | 3 refills | Status: DC

## 2018-12-24 MED ORDER — CLOPIDOGREL BISULFATE 75 MG PO TABS: 75.0000 mg | ORAL_TABLET | Freq: Every day | ORAL | 3 refills | Status: DC

## 2018-12-24 MED FILL — CLOPIDOGREL 75 MG TABLET: 75 | 30 days supply | Qty: 30 | Fill #0

## 2018-12-24 MED FILL — EZETIMIBE 10 MG TAB: 10 | 30 days supply | Qty: 30 | Fill #0

## 2018-12-24 NOTE — Patient Instructions (Addendum)
Continue aspirin 81 mg daily and clopidogrel 75 mg daily  and lipitor 80 and start on Zetia 10mg   for secondary stroke prevention  Continue to follow up with PCP regarding cholesterol and blood pressure management - obtain lab work for cholesterol levels at follow up with PCP in September   Follow up with vascular surgery on 8/6 for repeat ultrasound  Continue doing range of motion exercises and let us know if you need to restart baclofen   Continue to monitor blood pressure at home  Maintain strict control of hypertension with blood pressure goal below 130/90, diabetes with hemoglobin A1c goal below 6.5% and cholesterol with LDL cholesterol (bad cholesterol) goal below 70 mg/dL. I also advised the patient to eat a healthy diet with plenty of whole grains, cereals, fruits and vegetables, exercise regularly and maintain ideal body weight.  Followup in the future with me in 6 months or call earlier if needed       Thank you for coming to see [place] at [facility]. I hope we have been able to provide you high quality care today.  You may receive a patient satisfaction survey over the next few weeks. We would appreciate your feedback and comments so that we may continue to improve ourselves and the health of our patients.    Ezetimibe Tablets What is this medicine? EZETIMIBE (ez ET i mibe) blocks the absorption of cholesterol from the stomach. It can help lower blood cholesterol for patients who are at risk of getting heart disease or a stroke. It is only for patients whose cholesterol level is not controlled by diet. This medicine may be used for other purposes; ask your health care provider or pharmacist if you have questions. COMMON BRAND NAME(S): Zetia What should I tell my health care provider before I take this medicine? They need to know if you have any of these conditions:  liver disease  an unusual or allergic reaction to ezetimibe, medicines, foods, dyes, or  preservatives  pregnant or trying to get pregnant  breast-feeding How should I use this medicine? Take this medicine by mouth with a glass of water. Follow the directions on the prescription label. This medicine can be taken with or without food. Take your doses at regular intervals. Do not take your medicine more often than directed. Talk to your pediatrician regarding the use of this medicine in children. Special care may be needed. Overdosage: If you think you have taken too much of this medicine contact a poison control center or emergency room at once. NOTE: This medicine is only for you. Do not share this medicine with others. What if I miss a dose? If you miss a dose, take it as soon as you can. If it is almost time for your next dose, take only that dose. Do not take double or extra doses. What may interact with this medicine? Do not take this medicine with any of the following medications:  fenofibrate  gemfibrozil This medicine may also interact with the following medications:  antacids  cyclosporine  herbal medicines like red yeast rice  other medicines to lower cholesterol or triglycerides This list may not describe all possible interactions. Give your health care provider a list of all the medicines, herbs, non-prescription drugs, or dietary supplements you use. Also tell them if you smoke, drink alcohol, or use illegal drugs. Some items may interact with your medicine. What should I watch for while using this medicine? Visit your doctor or health care professional  for regular checks on your progress. You will need to have your cholesterol levels checked. If you are also taking some other cholesterol medicines, you will also need to have tests to make sure your liver is working properly. Tell your doctor or health care professional if you get any unexplained muscle pain, tenderness, or weakness, especially if you also have a fever and tiredness. You need to follow a  low-cholesterol, low-fat diet while you are taking this medicine. This will decrease your risk of getting heart and blood vessel disease. Exercising and avoiding alcohol and smoking can also help. Ask your doctor or dietician for advice. What side effects may I notice from receiving this medicine? Side effects that you should report to your doctor or health care professional as soon as possible:  allergic reactions like skin rash, itching or hives, swelling of the face, lips, or tongue  dark yellow or brown urine  unusually weak or tired  yellowing of the skin or eyes Side effects that usually do not require medical attention (report to your doctor or health care professional if they continue or are bothersome):  diarrhea  dizziness  headache  stomach upset or pain This list may not describe all possible side effects. Call your doctor for medical advice about side effects. You may report side effects to FDA at 1-800-FDA-1088. Where should I keep my medicine? Keep out of the reach of children. Store at room temperature between 15 and 30 degrees C (59 and 86 degrees F). Protect from moisture. Keep container tightly closed. Throw away any unused medicine after the expiration date. NOTE: This sheet is a summary. It may not cover all possible information. If you have questions about this medicine, talk to your doctor, pharmacist, or health care provider.  2020 [employer]vier/Gold Standard (2011-11-28 15:39:09)

## 2018-12-24 NOTE — Progress Notes (Signed)
[facility] [address] [place]. [place] [address] [phone]       OFFICE FOLLOW UP NOTE  [name] Date of Birth:  April 25, 1961 Medical Record Number:  [id number]   Reason for Referral:  hospital stroke follow up  CHIEF COMPLAINT:  Chief Complaint  Patient presents with   Follow-up    Room 9, alone. watershed infarcts f/u. States there are no changes.    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 History obtained from patient and chart review. Reviewed all radiology images and labs personally.  Nathan Hernandez is a 58 y.o. male with history of HLD, marijuana use, 2-3 beers/day who presented with L HP and progressive L sided numbness and tingling x 6 months.  Per notes, patient has been noticing this for the past 6 months but with sudden changes recently prior to admission.  His family was not aware of the symptoms and once it became aware, convince him to go to the hospital.  He typically does not go to doctors or take any prescription medications.  CT head reviewed and showed likely right brain infarct with signs of loss of gray-white differentiation and chronic bilateral BG with balance.  MRI brain reviewed and showed multiple small right cerebral watershed infarcts in various ages, chronic bilateral BG and pontine infarcts.  MRA head showed a normal distal right ICA with narrowing, diffuse cervical spondylosis with neuroforaminal stenosis and mild spinal stenosis C3-4.  CTA head and neck showed severe near occlusion right ICA bifurcation 1 cm in length with decreased flow of right ACA and right MCA along with carotid siphon right 75% and left 50%, BA 50% and left VA 50%.  2D echo showed an EF of 60 to 65%.  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  No  antithrombotic prior to admission and recommended DAPT with aspirin 325 mg and Plavix due to ICA stenosis.  LDL 89 and recommended increase of atorvastatin to 80 mg daily.  HTN stable during admission and recommended SBP 120-150.  Patient was discharged home in stable condition with recommendations of home health PT/OT with carotid enterectomy scheduled for 11/24/2017. Patient underwent right trans-carotid artery revascularization on 11/24/2017 which showed 95% right internal carotid artery stenosis stented to residual less than 10% stenosis and tolerated procedure well.  Later that evening after procedure, patient was complaining of LLQ abdominal pain 10/10 and was found to be hypotensive with SBP 80 and tachycardic.  CT was obtained with concern for retroperitoneal hematoma with compression of bladder immobilization of the left kidney.  Patient was taken back to the OR and underwent left groin exploration and evacuation of left retroperitoneal hematoma and ligation of tributary branch of the EIA.  Patient stable through rest of admission and was discharged home in stable condition. 04/04/2018 visit: Patient is being seen today for hospital follow-up.  He does continue to have left-sided weakness with slight improvement.  He was unable to obtain therapies after discharge due to lack of insurance but has recently obtained Medicaid approval and plans on scheduling appointments with PT/OT.  He does complain of left upper extremity pain along with a sensation of tightness and swelling which has been limiting his movement and activity.  He currently is wearing a left wrist brace as he feels as though flexing his  wrist is making the pain worse.  He also has recently been wearing a arm sling to immobilize his left arm is also helps to provide relief from the pain.  He is currently using a cane but is able to ambulate without any recent falls.  He continues aspirin and Plavix without side effects of bleeding or bruising.   He does have follow-up appointment with vascular surgery in 07/2018 for follow-up appointment and repeat carotid ultrasound.  It was recommended to continue on DAPT until that time.  Continues to take atorvastatin 80 mg without side effects myalgias.  Blood pressure today satisfactory 118/72.  No further concerns at this time.  Denies new or worsening stroke/TIA symptoms.  history 06/14/2018: Patient is being seen today for 68-month follow-up visit.  He continues on aspirin 325 mg and Plavix without side effects of bleeding or bruising.  Continues on atorvastatin without side effects myalgias.  Blood pressure today 150/83.  Patient continued to have left upper extremity pain and was referred to orthopedics by his PCP.  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 He does state great benefit with injection and continued therapy sessions.  He feels as though he has a greater range of motion and has been making progress as far as weakness.  He does continue to use baclofen 5mg  three times daily. It was recommended to increase to 10mg  TID after patient called stating this medication was not providing benefit but he had not done this due to confusion of patient with instruction label on medication bottle. Able to bend fingers more and use hand better as well.  He does continue to still have some balance difficulties and uses cane for assistance.  He is questioning whether he should return to PT for additional sessions.  No further concerns at this time.  Denies new or worsening stroke/TIA symptoms.  12/24/18 VISIT Nathan Hernandez is a 58 year old male who is being seen today for stroke secondary to large vessel disease source in 11/2017.  Residual deficits of LUE weakness.  Increase baclofen dosage at prior visit for prior complication of left frozen shoulder and prior spasticity. He feels as though overall improving  with increased stiffness with decreased range of motion at times. He ran out of baclofen about 1 month ago and has not had any worsening.  He continues range of motion exercises at home on his own.  Follow-up appointment with vascular surgery on 07/12/2018 with carotid duplex showing right carotid stent patent and left ICA stenosis minimally increased to 40 to 59%.  Recommended continuation of aspirin Plavix and statin and follow-up in 6 months time.  He has continued on DAPT without bleeding or bruising along with atorvastatin 80 mg without myalgias.  Lipid panel obtained by PCP on 12/03/2018 which showed LDL 107 and total cholesterol 212 which is an increase from prior panel.  Blood pressure satisfactory at 112/68.  No further concerns at this time.    ROS:   14 system review of systems performed and negative with exception of joint stiffness and weakness  PMH:  Past Medical History:  Diagnosis Date   HLD (hyperlipidemia)    Hypertension    Osteoarthritis    Polysubstance abuse ([place])    Stroke ([place])     PSH:  Past Surgical History:  Procedure Laterality Date   GROIN DEBRIDEMENT Left 11/24/2017   Procedure: EVACUATION OF LEFT GROIN HEMATOMA, REPAIR OF  LEFT FEMORAL ARTERY BRACH;  Surgeon: Larina EarthlyEarly, Todd F, MD;  [facility];  Service: Vascular;  Laterality: Left;   KNEE SURGERY     TRANSCAROTID ARTERY REVASCULARIZATION (TCAR)  11/24/2017   TRANSCAROTID ARTERY REVASCULARIZATION Right 11/24/2017   Procedure: TRANSCAROTID ARTERY REVASCULARIZATION;  Surgeon: Nada LibmanBrabham, Vance W, MD;  Location: [facility] OR;  Service: Vascular;  Laterality: Right;    Social History:  Social History   Socioeconomic History   Marital status: Divorced    Spouse name: Not on file   Number of children: Not on file   Years of education: Not on file   Highest education level: Not on file  Occupational History   Not on file  Social Needs   Financial resource strain: Not on file   Food insecurity     Worry: Not on file    Inability: Not on file   Transportation needs    Medical: Not on file    Non-medical: Not on file  Tobacco Use   Smoking status: Light Tobacco Smoker    Packs/day: 1.00    Types: Cigars   Smokeless tobacco: Never Used  Substance and Sexual Activity   Alcohol use: Yes    Alcohol/week: 1.0 standard drinks    Types: 1 Cans of beer per week    Comment: 2 to 3 per week   Drug use: Yes    Frequency: 7.0 times per week    Types: Marijuana    Comment: once a month usually for pain    Sexual activity: Not on file  Lifestyle   Physical activity    Days per week: Not on file    Minutes per session: Not on file   Stress: Not on file  Relationships   Social connections    Talks on phone: Not on file    Gets together: Not on file    Attends religious service: Not on file    Active member of club or organization: Not on file    Attends meetings of clubs or organizations: Not on file    Relationship status: Not on file   Intimate partner violence    Fear of current or ex partner: Not on file    Emotionally abused: Not on file    Physically abused: Not on file    Forced sexual activity: Not on file  Other Topics Concern   Not on file  Social History Narrative   Not on file    Family History:  Family History  Problem Relation Age of Onset   Diabetes Mother    Hypertension Mother    Hypertension Father    Diabetes Father     Medications:   Current Outpatient Medications on File Prior to Visit  Medication Sig Dispense Refill   aspirin 325 MG tablet Take 1 tablet (325 mg total) by mouth daily. 30 tablet 5   atorvastatin (LIPITOR) 80 MG tablet Take 1 tablet (80 mg total) by mouth daily at 6 PM. 30 tablet 3   clopidogrel (PLAVIX) 75 MG tablet Take 1 tablet (75 mg total) by mouth daily. 30 tablet 5   nicotine (NICODERM CQ - DOSED IN MG/24 HOURS) 21 mg/24hr patch Place 1 patch (21 mg total) onto the skin daily. 28 patch 0   No current  facility-administered medications on file prior to visit.     Allergies:  No Known Allergies   Physical Exam  Vitals:   12/24/18 1354  BP: 112/68  Pulse: 72  Temp: 98.2 F (36.8  C)  Weight: 167 lb 6.4 oz (75.9 kg)  Height: 6' (1.829 m)   Body mass index is 22.7 kg/m. No exam data present  General: well developed, well nourished, pleasant middle-aged African-American male, seated, in no evident distress Head: head normocephalic and atraumatic.   Neck: supple with no carotid or supraclavicular bruits Cardiovascular: regular rate and rhythm, no murmurs Musculoskeletal: no deformity; mildly decreased ROM left shoulder Skin:  no rash/petichiae Vascular:  Normal pulses all extremities  Neurologic Exam Mental Status: Awake and fully alert. Oriented to place and time. Recent and remote memory intact. Attention span, concentration and fund of knowledge appropriate. Mood and affect appropriate.  Cranial Nerves: Pupils equal, briskly reactive to light. Extraocular movements full without nystagmus. Visual fields full to confrontation. Hearing intact. Facial sensation intact. Face, tongue, palate moves normally and symmetrically.  Motor:  LUE: 4+/5 with mildly weak grip strength;  LLE: 5/5  Sensory.: intact to touch , pinprick , position and vibratory sensation.  Coordination: Rapid alternating movements normal on right side. Finger-to-nose and heel-to-shin performed accurately on right side.  Orbits right arm over left arm.  Decreased left hand finger dexterity. Gait and Station: Arises from chair without difficulty. Stance is normal.  Mild hemiplegic gait with stiffened left leg with use of cane Reflexes:1+ throughout all  extremities. Toes downgoing.     Diagnostic Data (Labs, Imaging, Testing)  VAS US CAROTID DUPLEX BILATERAL 07/12/2018 Summary: Right Carotid: Velocities in the right ICA are consistent with a 1-39% stenosis.                Patent stent with no evidence of  restenosis. Left Carotid: Velocities in the left ICA are consistent with a 40-59% stenosis. Vertebrals:  Bilateral vertebral arteries demonstrate antegrade flow. Subclavians: Normal flow hemodynamics were seen in bilateral subclavian              arteries.    ASSESSMENT: Damone Fancher is a 58 y.o. 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  Patient underwent right carotid enterectomy with stent placement on 11/24/2017 with complication of left retroperitoneal hematoma which was evacuated and EIA ligated. Vascular risk factors include HLD, tobacco use, THC use and alcohol use.  Residual deficit of mild LUE weakness with overall improvement in prior complications with left frozen shoulder.    PLAN: -Continue aspirin 325 mg daily and clopidogrel 75 mg daily for secondary stroke prevention -Continue atorvastatin 80 mg and start Zetia 10 mg daily due to recent LDL 107 and carotid stenosis with slightly worsening within left ICA -follow-up appointment scheduled with PCP in 02/2019 and request repeat lipid panel at that time for LDL goal less than 70 -f/u with vasc surgery 01/2019 for repeat carotid duplex along with discussion regarding DAPT duration -F/u with PCP regarding your HLD and HTN management -Continue range of motion exercises for left shoulder and to notify office if additional baclofen needed -continue to monitor BP at home -advised to continue to stay active and maintain a healthy diet -Maintain strict control of hypertension with blood pressure goal below 130/90, diabetes with hemoglobin A1c goal below 6.5% and cholesterol with LDL cholesterol (bad cholesterol) goal below 70 mg/dL. I also advised the patient to eat a healthy diet with plenty of whole grains, cereals, fruits and vegetables, exercise regularly and maintain ideal body weight.  Follow up in 6 months or call earlier if  needed   Greater  than 50% of time during this 25 minute visit was spent on counseling,explanation of diagnosis of right MCA watershed infarcts, reviewing risk factor management of right ICA occlusion s/p endarterectomy with stent placement, HLD, HTN and tobacco/substance abuse, planning of further management, discussion with patient and family and coordination of care    George HughJessica Tyshawn Keel, AGNP-BC  [facility] [address] [place], [place] [id number]  Phone [phone] Fax [phone] Note: This document was prepared with digital dictation and possible smart phrase technology. Any transcriptional errors that result from this process are unintentional.

## 2018-12-25 NOTE — Progress Notes (Signed)
I agree with the above plan 

## 2019-01-08 ENCOUNTER — Other Ambulatory Visit: Payer: Self-pay

## 2019-01-08 DIAGNOSIS — I6523 Occlusion and stenosis of bilateral carotid arteries: Secondary | ICD-10-CM

## 2019-01-09 ENCOUNTER — Telehealth (HOSPITAL_COMMUNITY): Payer: Self-pay | Admitting: Rehabilitation

## 2019-01-09 NOTE — Telephone Encounter (Signed)

## 2019-01-10 ENCOUNTER — Ambulatory Visit (INDEPENDENT_AMBULATORY_CARE_PROVIDER_SITE_OTHER): Payer: Medicaid Other | Admitting: Family

## 2019-01-10 ENCOUNTER — Ambulatory Visit (HOSPITAL_COMMUNITY)
Admission: RE | Admit: 2019-01-10 | Discharge: 2019-01-10 | Disposition: A | Discharge status: Home / Self Care | Payer: Medicaid Other | Source: Ambulatory Visit | Attending: Family | Admitting: Family

## 2019-01-10 ENCOUNTER — Encounter: Payer: Self-pay | Admitting: Family

## 2019-01-10 ENCOUNTER — Other Ambulatory Visit: Payer: Self-pay

## 2019-01-10 VITALS — BP 141/86 | HR 58 | Temp 97.9°F | Resp 16 | Ht 72.0 in | Wt 171.4 lb

## 2019-01-10 DIAGNOSIS — Z8673 Personal history of transient ischemic attack (TIA), and cerebral infarction without residual deficits: Secondary | ICD-10-CM

## 2019-01-10 DIAGNOSIS — F17290 Nicotine dependence, other tobacco product, uncomplicated: Secondary | ICD-10-CM

## 2019-01-10 DIAGNOSIS — I6523 Occlusion and stenosis of bilateral carotid arteries: Secondary | ICD-10-CM | POA: Diagnosis not present

## 2019-01-10 NOTE — Patient Instructions (Signed)
Stroke Prevention Some medical conditions and lifestyle choices can lead to a higher risk for a stroke. You can help to prevent a stroke by making nutrition, lifestyle, and other changes. What nutrition changes can be made?   Eat healthy foods. ? Choose foods that are high in fiber. These include:  Fresh fruits.  Fresh vegetables.  Whole grains. ? Eat at least 5 or more servings of fruits and vegetables each day. Try to fill half of your plate at each meal with fruits and vegetables. ? Choose lean protein foods. These include:  Lowfat (lean) cuts of meat.  Chicken without skin.  Fish.  Tofu.  Beans.  Nuts. ? Eat low-fat dairy products. ? Avoid foods that:  Are high in salt (sodium).  Have saturated fat.  Have trans fat.  Have cholesterol.  Are processed.  Are premade.  Follow eating guidelines as told by your doctor. These may include: ? Reducing how many calories you eat and drink each day. ? Limiting how much salt you eat or drink each day to 1,500 milligrams (mg). ? Using only healthy fats for cooking. These include:  Olive oil.  Canola oil.  Sunflower oil. ? Counting how many carbohydrates you eat and drink each day. What lifestyle changes can be made?  Try to stay at a healthy weight. Talk to your doctor about what a good weight is for you.  Get at least 30 minutes of moderate physical activity at least 5 days a week. This can include: ? Fast walking. ? Biking. ? Swimming.  Do not use any products that have nicotine or tobacco. This includes cigarettes and e-cigarettes. If you need help quitting, ask your doctor. Avoid being around tobacco smoke in general.  Limit how much alcohol you drink to no more than 1 drink a day for nonpregnant women and 2 drinks a day for men. One drink equals 12 oz of beer, 5 oz of wine, or 1 oz of hard liquor.  Do not use drugs.  Avoid taking birth control pills. Talk to your doctor about the risks of taking birth  control pills if: ? You are over 35 years old. ? You smoke. ? You get migraines. ? You have had a blood clot. What other changes can be made?  Manage your cholesterol. ? It is important to eat a healthy diet. ? If your cholesterol cannot be managed through your diet, you may also need to take medicines. Take medicines as told by your doctor.  Manage your diabetes. ? It is important to eat a healthy diet and to exercise regularly. ? If your blood sugar cannot be managed through diet and exercise, you may need to take medicines. Take medicines as told by your doctor.  Control your high blood pressure (hypertension). ? Try to keep your blood pressure below 130/80. This can help lower your risk of stroke. ? It is important to eat a healthy diet and to exercise regularly. ? If your blood pressure cannot be managed through diet and exercise, you may need to take medicines. Take medicines as told by your doctor. ? Ask your doctor if you should check your blood pressure at home. ? Have your blood pressure checked every year. Do this even if your blood pressure is normal.  Talk to your doctor about getting checked for a sleep disorder. Signs of this can include: ? Snoring a lot. ? Feeling very tired.  Take over-the-counter and prescription medicines only as told by your doctor. These may   include aspirin or blood thinners (antiplatelets or anticoagulants).  Make sure that any other medical conditions you have are managed. Where to find more information  American Stroke Association: www.strokeassociation.org  National Stroke Association: www.stroke.org Get help right away if:  You have any symptoms of stroke. "BE FAST" is an easy way to remember the main warning signs: ? B - Balance. Signs are dizziness, sudden trouble walking, or loss of balance. ? E - Eyes. Signs are trouble seeing or a sudden change in how you see. ? F - Face. Signs are sudden weakness or loss of feeling of the face,  or the face or eyelid drooping on one side. ? A - Arms. Signs are weakness or loss of feeling in an arm. This happens suddenly and usually on one side of the body. ? S - Speech. Signs are sudden trouble speaking, slurred speech, or trouble understanding what people say. ? T - Time. Time to call emergency services. Write down what time symptoms started.  You have other signs of stroke, such as: ? A sudden, very bad headache with no known cause. ? Feeling sick to your stomach (nausea). ? Throwing up (vomiting). ? Jerky movements you cannot control (seizure). These symptoms may represent a serious problem that is an emergency. Do not wait to see if the symptoms will go away. Get medical help right away. Call your local emergency services (911 in the [place] Do not drive yourself to the hospital. Summary  You can prevent a stroke by eating healthy, exercising, not smoking, drinking less alcohol, and treating other health problems, such as diabetes, high blood pressure, or high cholesterol.  Do not use any products that contain nicotine or tobacco, such as cigarettes and e-cigarettes.  Get help right away if you have any signs or symptoms of a stroke. This information is not intended to replace advice given to you by your health care provider. Make sure you discuss any questions you have with your health care provider. Document Released: 11/22/2011 Document Revised: 07/19/2018 Document Reviewed: 08/24/2016 Elsevier Patient Education  2020 [employer]     Steps to Quit Smoking Smoking tobacco is the leading cause of preventable death. It can affect almost every organ in the body. Smoking puts you and people around you at risk for many serious, long-lasting (chronic) diseases. Quitting smoking can be hard, but it is one of the best things that you can do for your health. It is never too late to quit. How do I get ready to quit? When you decide to quit smoking, make a plan to help you succeed.  Before you quit:  Pick a date to quit. Set a date within the next 2 weeks to give you time to prepare.  Write down the reasons why you are quitting. Keep this list in places where you will see it often.  Tell your family, friends, and co-workers that you are quitting. Their support is important.  Talk with your doctor about the choices that may help you quit.  Find out if your health insurance will pay for these treatments.  Know the people, places, things, and activities that make you want to smoke (triggers). Avoid them. What first steps can I take to quit smoking?  Throw away all cigarettes at home, at work, and in your car.  Throw away the things that you use when you smoke, such as ashtrays and lighters.  Clean your car. Make sure to empty the ashtray.  Clean your home, including curtains  and carpets. What can I do to help me quit smoking? Talk with your doctor about taking medicines and seeing a counselor at the same time. You are more likely to succeed when you do both.  If you are pregnant or breastfeeding, talk with your doctor about counseling or other ways to quit smoking. Do not take medicine to help you quit smoking unless your doctor tells you to do so. To quit smoking: Quit right away  Quit smoking totally, instead of slowly cutting back on how much you smoke over a period of time.  Go to counseling. You are more likely to quit if you go to counseling sessions regularly. Take medicine You may take medicines to help you quit. Some medicines need a prescription, and some you can buy over-the-counter. Some medicines may contain a drug called nicotine to replace the nicotine in cigarettes. Medicines may:  Help you to stop having the desire to smoke (cravings).  Help to stop the problems that come when you stop smoking (withdrawal symptoms). Your doctor may ask you to use:  Nicotine patches, gum, or lozenges.  Nicotine inhalers or sprays.  Non-nicotine medicine  that is taken by mouth. Find resources Find resources and other ways to help you quit smoking and remain smoke-free after you quit. These resources are most helpful when you use them often. They include:  Online chats with a Social worker.  Phone quitlines.  Printed Furniture conservator/restorer.  Support groups or group counseling.  Text messaging programs.  Mobile phone apps. Use apps on your mobile phone or tablet that can help you stick to your quit plan. There are many free apps for mobile phones and tablets as well as websites. Examples include Quit Guide from the [employer] and smokefree.gov  What things can I do to make it easier to quit?   Talk to your family and friends. Ask them to support and encourage you.  Call a phone quitline (1-800-QUIT-NOW), reach out to support groups, or work with a Social worker.  Ask people who smoke to not smoke around you.  Avoid places that make you want to smoke, such as: ? Bars. ? Parties. ? Smoke-break areas at work.  Spend time with people who do not smoke.  Lower the stress in your life. Stress can make you want to smoke. Try these things to help your stress: ? Getting regular exercise. ? Doing deep-breathing exercises. ? Doing yoga. ? Meditating. ? Doing a body scan. To do this, close your eyes, focus on one area of your body at a time from head to toe. Notice which parts of your body are tense. Try to relax the muscles in those areas. How will I feel when I quit smoking? Day 1 to 3 weeks Within the first 24 hours, you may start to have some problems that come from quitting tobacco. These problems are very bad 2-3 days after you quit, but they do not often last for more than 2-3 weeks. You may get these symptoms:  Mood swings.  Feeling restless, nervous, angry, or annoyed.  Trouble concentrating.  Dizziness.  Strong desire for high-sugar foods and nicotine.  Weight gain.  Trouble pooping (constipation).  Feeling like you may vomit  (nausea).  Coughing or a sore throat.  Changes in how the medicines that you take for other issues work in your body.  Depression.  Trouble sleeping (insomnia). Week 3 and afterward After the first 2-3 weeks of quitting, you may start to notice more positive results, such as:  Better sense of smell and taste.  Less coughing and sore throat.  Slower heart rate.  Lower blood pressure.  Clearer skin.  Better breathing.  Fewer sick days. Quitting smoking can be hard. Do not give up if you fail the first time. Some people need to try a few times before they succeed. Do your best to stick to your quit plan, and talk with your doctor if you have any questions or concerns. Summary  Smoking tobacco is the leading cause of preventable death. Quitting smoking can be hard, but it is one of the best things that you can do for your health.  When you decide to quit smoking, make a plan to help you succeed.  Quit smoking right away, not slowly over a period of time.  When you start quitting, seek help from your doctor, family, or friends. This information is not intended to replace advice given to you by your health care provider. Make sure you discuss any questions you have with your health care provider. Document Released: 03/19/2009 Document Revised: 08/10/2018 Document Reviewed: 08/11/2018 Elsevier Patient Education  2020 [employer]

## 2019-01-10 NOTE — Progress Notes (Signed)
Chief Complaint: Follow up Extracranial Carotid Artery Stenosis   History of Present Illness  Nathan Hernandez is a 58 y.o. male who is status post right TCAR performed by Dr. Myra GianottiBrabham on 11/24/2017.  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  Postoperatively during hospitalization he developed left flank pain and was found to have a large hematoma and retroperitoneal bleed.  He was taken to the operating room by Dr. early that evening and found that the venous cannulation was through a tributary branch of the left external iliac artery which was ligated.    He was last evaluated by M. Eveland PA-C on 07-12-18. At that time carotid duplex demonstrated a patent right carotid stent without in-stent stenosis Velocities in the left ICA only minimally increased to 40 to 59% category Continue aspirin, Plavix, and statin daily for now Advised to recheck carotid duplex in 6 months.  He returns today for 6 months follow up.   He denies any further strokelike symptoms including slurring speech, changes in vision or further one-sided weakness.  He has finished working with physical therapy on strengthening left arm and left hand.  He reports issues with his left knee before the stroke.   Diabetic: no Tobacco use: smoker  (2 cigars/day, started smoking in his his 20's)  Pt meds include: Statin : yes ASA: yes Other anticoagulants/antiplatelets: Plavix, he denies any bleeding issues    Past Medical History:  Diagnosis Date  . HLD (hyperlipidemia)   . Hypertension   . Osteoarthritis   . Polysubstance abuse (HCC)   . Stroke [facility](HCC)     Social History Social History   Tobacco Use  . Smoking status: Light Tobacco Smoker    Packs/day: 1.00    Types: Cigars  . Smokeless tobacco: Never Used  Substance Use Topics  . Alcohol use: Yes    Alcohol/week: 1.0 standard drinks    Types: 1 Cans of beer per week    Comment:  2 to 3 per week  . Drug use: Yes    Frequency: 7.0 times per week    Types: Marijuana    Comment: once a month usually for pain     Family History Family History  Problem Relation Age of Onset  . Diabetes Mother   . Hypertension Mother   . Hypertension Father   . Diabetes Father     Surgical History Past Surgical History:  Procedure Laterality Date  . GROIN DEBRIDEMENT Left 11/24/2017   Procedure: EVACUATION OF LEFT GROIN HEMATOMA, REPAIR OF LEFT FEMORAL ARTERY BRACH;  Surgeon: Larina EarthlyEarly, Todd F, MD;  Location: [facility] OR;  Service: Vascular;  Laterality: Left;  . KNEE SURGERY    . TRANSCAROTID ARTERY REVASCULARIZATION (TCAR)  11/24/2017  . TRANSCAROTID ARTERY REVASCULARIZATION Right 11/24/2017   Procedure: TRANSCAROTID ARTERY REVASCULARIZATION;  Surgeon: Nada LibmanBrabham, Vance W, MD;  [facility];  Service: Vascular;  Laterality: Right;    No Known Allergies  Current Outpatient Medications  Medication Sig Dispense Refill  . aspirin 325 MG tablet Take 1 tablet (325 mg total) by mouth daily. 30 tablet 5  . atorvastatin (LIPITOR) 80 MG tablet Take 1 tablet (80 mg total) by mouth daily at 6 PM. 30 tablet 3  . clopidogrel (PLAVIX) 75 MG tablet Take 1 tablet (75 mg total) by mouth daily. 30 tablet 3  . ezetimibe (ZETIA) 10 MG tablet Take 1 tablet (10 mg total) by mouth daily. 30 tablet 3  .  nicotine (NICODERM CQ - DOSED IN MG/24 HOURS) 21 mg/24hr patch Place 1 patch (21 mg total) onto the skin daily. 28 patch 0   No current facility-administered medications for this visit.     Review of Systems : See HPI for pertinent positives and negatives.  Physical Examination  Vitals:   01/10/19 1444  BP: (!) 141/86  Pulse: (!) 58  Resp: 16  Temp: 97.9 F (36.6 C)  TempSrc: Temporal  SpO2: 100%  Weight: 171 lb 6.4 oz (77.7 kg)  Height: 6' (1.829 m)   Body mass index is 23.25 kg/m.  General: WDWN fit appearing male in NAD GAIT: limp Eyes: PERRLA, bilateral arcus senilus HENT: No gross  abnormalities.  Pulmonary:  Respirations are non-labored, fair air movement in all fields, no rales, rhonchi, or wheezing. Cardiac: regular rhythm, no detected murmur.  VASCULAR EXAM Carotid Bruits Right Left   Negative Negative     Abdominal aortic pulse is not palpable. Radial pulses are 2+ palpable and equal.                                                                                                                            LE Pulses Right Left       POPLITEAL   palpable   not palpable       POSTERIOR TIBIAL  faintly palpable    palpable        DORSALIS PEDIS      ANTERIOR TIBIAL not palpable  not palpable     Gastrointestinal: soft, nontender, BS WNL, no r/g, no palpable masses. Musculoskeletal: no muscle atrophy/wasting. M/S 5/5 throughout except 4/5 in left UE, extremities without ischemic changes Skin: No rashes, no ulcers, no cellulitis.   Neurologic:  A&O X 3; appropriate affect, sensation is normal; speech is normal, CN 2-12 intact, pain and light touch intact in extremities, motor exam as listed above. Psychiatric: Normal thought content, mood appropriate to clinical situation.    DATA Carotid Duplex (01-10-19): Right Carotid: Non-hemodynamically significant plaque <50% noted in the CCA. The                ECA appears <50% stenosed. Patent right CCA to ICA stent without                evidence of restenosis. Left Carotid: Velocities in the left ICA are consistent with a 40-59% stenosis.               Non-hemodynamically significant plaque noted in the CCA. The ECA               appears <50% stenosed. Vertebrals:  Bilateral vertebral arteries demonstrate antegrade flow. Subclavians: Normal flow hemodynamics were seen in bilateral subclavian arteries. No significant change compared to the exam on 07-12-18.    Assessment: Nathan Hernandez is a 58 y.o. male who is status post right TCAR performed by Dr. Myra GianottiBrabham on 11/24/2017.  This procedure was performed due to  symptomatic stenosis  with right-sided CVA resulting in left arm and left leg weakness.   He has mild residual left arm weakness, he has finished physical therapy.  He has not has any subsequent neurological episodes.   Carotid duplex today shows no change compared to 07-12-18:  Patent right CCA to ICA stent without evidence of restenosis and 40-59% left ICA stenosis.     Plan: Follow-up in 9 months with Carotid Duplex scan.   I discussed in depth with the patient the nature of atherosclerosis, and emphasized the importance of maximal medical management including strict control of blood pressure, blood glucose, and lipid levels, obtaining regular exercise, and cessation of smoking.  The patient is aware that without maximal medical management the underlying atherosclerotic disease process will progress, limiting the benefit of any interventions. The patient was given information about stroke prevention and what symptoms should prompt the patient to seek immediate medical care. Thank you for allowing [place] to participate in this patient's care.  Clemon Chambers, RN, MSN, FNP-C [facility] Office: [phone]  Clinic Physician: Laqueta Due  01/10/19 2:53 PM

## 2019-01-24 MED FILL — CLOPIDOGREL 75 MG TABLET: 75 | 30 days supply | Qty: 30 | Fill #1

## 2019-01-24 MED FILL — EZETIMIBE 10 MG TAB: 10 | 30 days supply | Qty: 30 | Fill #1

## 2019-01-26 ENCOUNTER — Other Ambulatory Visit: Payer: Self-pay

## 2019-01-26 DIAGNOSIS — Z20822 Contact with and (suspected) exposure to covid-19: Secondary | ICD-10-CM

## 2019-01-27 LAB — NOVEL CORONAVIRUS, NAA: SARS-CoV-2, NAA: NOT DETECTED

## 2019-02-25 MED FILL — CLOPIDOGREL 75 MG TABLET: 75 | 30 days supply | Qty: 30 | Fill #2

## 2019-02-25 MED FILL — EZETIMIBE 10 MG TAB: 10 | 30 days supply | Qty: 30 | Fill #2

## 2019-03-04 ENCOUNTER — Telehealth (INDEPENDENT_AMBULATORY_CARE_PROVIDER_SITE_OTHER): Payer: Medicaid Other | Admitting: Primary Care

## 2019-03-14 MED FILL — ATORVASTATIN 80 MG TABLET: 80 | 30 days supply | Qty: 30 | Fill #1

## 2019-03-29 MED FILL — EZETIMIBE 10 MG TAB: 10 | 30 days supply | Qty: 30 | Fill #3

## 2019-03-29 MED FILL — CLOPIDOGREL 75 MG TABLET: 75 | 30 days supply | Qty: 30 | Fill #3

## 2019-04-10 ENCOUNTER — Other Ambulatory Visit (INDEPENDENT_AMBULATORY_CARE_PROVIDER_SITE_OTHER): Payer: Self-pay | Admitting: Primary Care

## 2019-04-10 ENCOUNTER — Other Ambulatory Visit: Payer: Self-pay | Admitting: Adult Health

## 2019-04-10 DIAGNOSIS — E782 Mixed hyperlipidemia: Secondary | ICD-10-CM

## 2019-04-10 DIAGNOSIS — I639 Cerebral infarction, unspecified: Secondary | ICD-10-CM

## 2019-04-10 MED FILL — ATORVASTATIN 80 MG TABLET: 80 | 30 days supply | Qty: 30 | Fill #0

## 2019-04-10 NOTE — Telephone Encounter (Signed)
Needs appointment and labs

## 2019-05-07 MED FILL — EZETIMIBE 10 MG TAB: 10 | 30 days supply | Qty: 30 | Fill #0

## 2019-05-07 MED FILL — CLOPIDOGREL 75 MG TABLET: 75 | 30 days supply | Qty: 30 | Fill #0

## 2019-05-14 ENCOUNTER — Other Ambulatory Visit (INDEPENDENT_AMBULATORY_CARE_PROVIDER_SITE_OTHER): Payer: Self-pay | Admitting: Primary Care

## 2019-05-14 DIAGNOSIS — E782 Mixed hyperlipidemia: Secondary | ICD-10-CM

## 2019-05-14 MED FILL — ATORVASTATIN 80 MG TABLET: 80 | 30 days supply | Qty: 30 | Fill #0

## 2019-06-04 MED FILL — CLOPIDOGREL 75 MG TABLET: 75 | 30 days supply | Qty: 30 | Fill #1

## 2019-06-04 MED FILL — EZETIMIBE 10 MG TAB: 10 | 30 days supply | Qty: 30 | Fill #1

## 2019-06-14 ENCOUNTER — Other Ambulatory Visit (INDEPENDENT_AMBULATORY_CARE_PROVIDER_SITE_OTHER): Payer: Self-pay | Admitting: Primary Care

## 2019-06-14 DIAGNOSIS — E782 Mixed hyperlipidemia: Secondary | ICD-10-CM

## 2019-06-18 ENCOUNTER — Other Ambulatory Visit (INDEPENDENT_AMBULATORY_CARE_PROVIDER_SITE_OTHER): Payer: Self-pay | Admitting: Primary Care

## 2019-06-18 DIAGNOSIS — E782 Mixed hyperlipidemia: Secondary | ICD-10-CM

## 2019-06-21 ENCOUNTER — Encounter (HOSPITAL_COMMUNITY): Payer: Self-pay | Admitting: *Deleted

## 2019-06-21 ENCOUNTER — Other Ambulatory Visit: Payer: Self-pay

## 2019-06-21 ENCOUNTER — Emergency Department (HOSPITAL_COMMUNITY)
Admission: EM | Admit: 2019-06-21 | Discharge: 2019-06-21 | Disposition: A | Discharge status: Home / Self Care | Payer: Medicaid Other | Attending: Emergency Medicine | Admitting: Emergency Medicine

## 2019-06-21 ENCOUNTER — Emergency Department (HOSPITAL_COMMUNITY): Payer: Medicaid Other

## 2019-06-21 DIAGNOSIS — H5789 Other specified disorders of eye and adnexa: Secondary | ICD-10-CM

## 2019-06-21 DIAGNOSIS — Z23 Encounter for immunization: Secondary | ICD-10-CM | POA: Insufficient documentation

## 2019-06-21 DIAGNOSIS — I10 Essential (primary) hypertension: Secondary | ICD-10-CM | POA: Diagnosis not present

## 2019-06-21 DIAGNOSIS — S01111A Laceration without foreign body of right eyelid and periocular area, initial encounter: Secondary | ICD-10-CM | POA: Diagnosis not present

## 2019-06-21 DIAGNOSIS — F1210 Cannabis abuse, uncomplicated: Secondary | ICD-10-CM | POA: Diagnosis not present

## 2019-06-21 DIAGNOSIS — Z7982 Long term (current) use of aspirin: Secondary | ICD-10-CM | POA: Diagnosis not present

## 2019-06-21 DIAGNOSIS — Y939 Activity, unspecified: Secondary | ICD-10-CM | POA: Insufficient documentation

## 2019-06-21 DIAGNOSIS — S0181XA Laceration without foreign body of other part of head, initial encounter: Secondary | ICD-10-CM | POA: Insufficient documentation

## 2019-06-21 DIAGNOSIS — Z79899 Other long term (current) drug therapy: Secondary | ICD-10-CM | POA: Insufficient documentation

## 2019-06-21 DIAGNOSIS — F17210 Nicotine dependence, cigarettes, uncomplicated: Secondary | ICD-10-CM | POA: Insufficient documentation

## 2019-06-21 DIAGNOSIS — I2510 Atherosclerotic heart disease of native coronary artery without angina pectoris: Secondary | ICD-10-CM | POA: Insufficient documentation

## 2019-06-21 DIAGNOSIS — Z7901 Long term (current) use of anticoagulants: Secondary | ICD-10-CM | POA: Diagnosis not present

## 2019-06-21 DIAGNOSIS — Y929 Unspecified place or not applicable: Secondary | ICD-10-CM | POA: Insufficient documentation

## 2019-06-21 DIAGNOSIS — Z8673 Personal history of transient ischemic attack (TIA), and cerebral infarction without residual deficits: Secondary | ICD-10-CM | POA: Insufficient documentation

## 2019-06-21 DIAGNOSIS — W19XXXA Unspecified fall, initial encounter: Secondary | ICD-10-CM

## 2019-06-21 DIAGNOSIS — S7011XA Contusion of right thigh, initial encounter: Secondary | ICD-10-CM | POA: Diagnosis not present

## 2019-06-21 DIAGNOSIS — S199XXA Unspecified injury of neck, initial encounter: Secondary | ICD-10-CM | POA: Diagnosis not present

## 2019-06-21 DIAGNOSIS — S0003XA Contusion of scalp, initial encounter: Secondary | ICD-10-CM | POA: Diagnosis not present

## 2019-06-21 DIAGNOSIS — S0990XA Unspecified injury of head, initial encounter: Secondary | ICD-10-CM | POA: Diagnosis present

## 2019-06-21 DIAGNOSIS — S0993XA Unspecified injury of face, initial encounter: Secondary | ICD-10-CM | POA: Diagnosis not present

## 2019-06-21 DIAGNOSIS — Y999 Unspecified external cause status: Secondary | ICD-10-CM | POA: Insufficient documentation

## 2019-06-21 MED ORDER — TETANUS-DIPHTH-ACELL PERTUSSIS 5-2.5-18.5 LF-MCG/0.5 IM SUSP
0.5000 mL | Freq: Once | INTRAMUSCULAR | Status: AC
  Administered 2019-06-21: 0.5 mL via INTRAMUSCULAR
  Filled 2019-06-21: qty 0.5

## 2019-06-21 NOTE — ED Triage Notes (Signed)
Pt reports falling last night when getting out of his car, hit face/head and pt is on plavix. Swelling noted to right eye. Denies loc.

## 2019-06-21 NOTE — ED Provider Notes (Signed)
[place] EMERGENCY DEPARTMENT Provider Note   CSN: [id number] Arrival date & time: 06/21/19  1028    History Chief Complaint  Patient presents with  . Fall   Nathan Hernandez is a 59 y.o. male with past medical history significant for CVA, CAD substance abuse who presents for evaluation mechanical fall.  Patient states yesterday evening approximately 18 hours PTA he went to step out of a friend's truck and suffered a fall.  Patient states he fell and hit the right side of his head on the pavement.  Patient states he noticed a "gash" to his right eyebrow.  He noticed immediate swelling to the right side of his face to the forehead and his right eye.  Patient states he did not originally have any blurred vision or any injury to his eye that he knew of.  Patient states he woke up this morning and his right eye was swollen shut.  He does not know the name of his ophthalmologist.  Patient states after his initial fall he did not have any pain with EOMs.  He has not taken anything for his pain.  He is unsure of his last tetanus.  No fever, chills, nausea, vomiting, has right-sided vision changes due to inability to open up his eye, dizziness, lightheadedness, neck pain, neck stiffness, chest pain, shortness of breath abdominal pain, diarrhea, dysuria, pain to extremities, redness, swelling, warmth to extremities.  Has been ambulatory PTA without difficulty.  Denies prior paresthesias, or bladder incontinence, saddle paresthesias.  Denies additional aggravating or alleviating factors.   Patient is on Plavix  History obtained from patient and past medical records.  No interpreter is used.  HPI     Past Medical History:  Diagnosis Date  . HLD (hyperlipidemia)   . Hypertension   . Osteoarthritis   . Polysubstance abuse ([place])   . Stroke [facility])     Patient Active Problem List   Diagnosis Date Noted  . Carotid artery disease ([place]) 11/25/2017  . Carotid stenosis 11/24/2017  .  Hyperlipidemia 11/21/2017  . Marijuana use 11/21/2017  . Acute bilat watershed infarction [facility]) 11/20/2017  . Dyslipidemia 11/20/2017  . Hypertension 11/20/2017  . Polysubstance abuse ([place]) 11/20/2017  . HLD (hyperlipidemia) 05/20/2014  . Osteoarthritis of right knee 11/27/2013  . Establishing care with new doctor, encounter for 04/05/2013  . Poor vision 04/05/2013  . High blood pressure 04/05/2013  . Knee pain, acute 04/05/2013    Past Surgical History:  Procedure Laterality Date  . GROIN DEBRIDEMENT Left 11/24/2017   Procedure: EVACUATION OF LEFT GROIN HEMATOMA, REPAIR OF LEFT FEMORAL ARTERY BRACH;  Surgeon: Rosetta Posner, MD;  Location: [place];  Service: Vascular;  Laterality: Left;  . KNEE SURGERY    . TRANSCAROTID ARTERY REVASCULARIZATION (TCAR)  11/24/2017  . TRANSCAROTID ARTERY REVASCULARIZATION Right 11/24/2017   Procedure: TRANSCAROTID ARTERY REVASCULARIZATION;  Surgeon: Serafina Mitchell, MD;  Location: [facility] OR;  Service: Vascular;  Laterality: Right;       Family History  Problem Relation Age of Onset  . Diabetes Mother   . Hypertension Mother   . Hypertension Father   . Diabetes Father     Social History   Tobacco Use  . Smoking status: Light Tobacco Smoker    Packs/day: 1.00    Types: Cigars  . Smokeless tobacco: Never Used  Substance Use Topics  . Alcohol use: Yes    Alcohol/week: 1.0 standard drinks    Types: 1 Cans of beer per week  Comment: 2 to 3 per week  . Drug use: Yes    Frequency: 7.0 times per week    Types: Marijuana    Comment: once a month usually for pain     Home Medications Prior to Admission medications   Medication Sig Start Date End Date Taking? Authorizing Provider  aspirin 325 MG tablet Take 1 tablet (325 mg total) by mouth daily. 01/08/18  Yes Loletta Specter, PA-C  Aspirin-Acetaminophen-Caffeine (GOODY HEADACHE PO) Take 2 packets by mouth daily as needed (headache/pain).   Yes [provider]  atorvastatin (LIPITOR)  80 MG tablet TAKE 1 TABLET (80 MG TOTAL) BY MOUTH DAILY AT 6 PM. 05/14/19  Yes Grayce Sessions, NP  clopidogrel (PLAVIX) 75 MG tablet TAKE 1 TABLET (75 MG TOTAL) BY MOUTH DAILY. 04/11/19  Yes McCue, Shanda Bumps, NP  ezetimibe (ZETIA) 10 MG tablet TAKE 1 TABLET (10 MG TOTAL) BY MOUTH DAILY. Patient taking differently: Take 10 mg by mouth daily at 6 PM.  04/11/19  Yes McCue, Shanda Bumps, NP  ibuprofen (ADVIL) 200 MG tablet Take 400-600 mg by mouth every 6 (six) hours as needed for headache (pain).   Yes [provider]  nicotine (NICODERM CQ - DOSED IN MG/24 HOURS) 21 mg/24hr patch Place 1 patch (21 mg total) onto the skin daily. Patient taking differently: Place 21 mg onto the skin daily as needed (smoking cessation).  04/10/18  Yes Loletta Specter, PA-C    Allergies    Patient has no known allergies.  Review of Systems   Review of Systems  Constitutional: Negative.   HENT: Positive for facial swelling. Negative for congestion, ear discharge, ear pain, postnasal drip, rhinorrhea, sinus pain, sore throat, trouble swallowing and voice change.   Eyes:       Right eye swelling  Respiratory: Negative.   Cardiovascular: Negative.   Gastrointestinal: Negative.   Genitourinary: Negative.   Musculoskeletal: Negative.   Skin: Negative.   Neurological: Negative.   All other systems reviewed and are negative.   Physical Exam Updated Vital Signs BP (!) 151/79 (BP Location: Right Arm)   Pulse 77   Temp 98.5 F (36.9 C) (Oral)   Resp 14   SpO2 100%   Physical Exam Vitals and nursing note reviewed.  Constitutional:      General: He is not in acute distress.    Appearance: He is well-developed. He is not ill-appearing, toxic-appearing or diaphoretic.  HENT:     Head: Normocephalic. Abrasion and contusion present.     Jaw: There is normal jaw occlusion.      Comments: Echymosis and swelling to right periorbital area. Unable to open right eye secondary to swelling. Mild drainage to  right eye. Dried crusted blood to right eyebrow.  No drooling, dysphagia or trismus.  Dentition intact.    Right Ear: Ear canal and external ear normal. No hemotympanum.     Left Ear: Ear canal and external ear normal. No hemotympanum.     Nose: Nose normal.     Comments: No septal hematoma    Mouth/Throat:     Lips: Pink.     Mouth: Mucous membranes are moist.     Pharynx: Oropharynx is clear. Uvula midline.     Comments: Mucous membranes moist. No drooling, dysphagia, trismus. Tongue midline. Eyes:     Pupils: Pupils are equal, round, and reactive to light.     Comments: Significant swelling to periorbital region to right eye.  Unable to open right eye.  Left  eye without ecchymosis.  EOMs intact to left eye.  No nystagmus to left eye.  Neck:     Trachea: Phonation normal.     Comments: No midline cervical tenderness palpation.  Phonation normal Cardiovascular:     Rate and Rhythm: Normal rate and regular rhythm.     Pulses: Normal pulses.          Radial pulses are 2+ on the right side and 2+ on the left side.     Heart sounds: Normal heart sounds.  Pulmonary:     Effort: Pulmonary effort is normal. No respiratory distress.     Breath sounds: Normal breath sounds and air entry.     Comments: Speaks in full sentences without difficulty Chest:     Comments: No chest wall tenderness palpation, crepitus or step-offs Abdominal:     General: Bowel sounds are normal. There is no distension.     Palpations: Abdomen is soft.     Tenderness: There is no abdominal tenderness.     Comments: Soft, nontender without rebound or guarding.  No overlying skin changes  Musculoskeletal:        General: Normal range of motion.     Cervical back: Full passive range of motion without pain, normal range of motion and neck supple.     Comments: Moves all 4 extremities without difficulty.  No bony tenderness.  Compartments soft.  Skin:    General: Skin is warm and dry.     Capillary Refill: Capillary  refill takes less than 2 seconds.     Comments: Crusted blood surrounding entire right eyebrow.  Unable to determine if laceration present due to crusting.  21mm laceration to right eyebrow. No active bleeding or drainage.  Neurological:     Mental Status: He is alert.     Comments: No facial droop.  EOMs intact to left eye, unable to assess right eye due to swelling.  Full and equal grip strength bilaterally.  Ambulatory without difficulty.    ED Results / Procedures / Treatments   Labs (all labs ordered are listed, but only abnormal results are displayed) Labs Reviewed - No data to display  EKG None  Radiology No results found.  Procedures .Marland KitchenLaceration Repair  Date/Time: 06/21/2019 5:49 PM Performed by: Linwood Dibbles, PA-C Authorized by: Linwood Dibbles, PA-C   Consent:    Consent obtained:  Verbal   Consent given by:  Patient   Risks discussed:  Infection, need for additional repair, pain, poor cosmetic result and poor wound healing   Alternatives discussed:  No treatment and delayed treatment Universal protocol:    Procedure explained and questions answered to patient or proxy's satisfaction: yes     Relevant documents present and verified: yes     Test results available and properly labeled: yes     Imaging studies available: yes     Required blood products, implants, devices, and special equipment available: yes     Site/side marked: yes     Immediately prior to procedure, a time out was called: yes     Patient identity confirmed:  Verbally with patient Anesthesia (see MAR for exact dosages):    Anesthesia method:  None Laceration details:    Location:  Face   Face location:  R eyebrow   Length (cm):  0.8   Depth (mm):  3 Repair type:    Repair type:  Simple Pre-procedure details:    Preparation:  Patient was prepped and draped in usual sterile  fashion and imaging obtained to evaluate for foreign bodies Exploration:    Wound extent: no areolar tissue  violation noted, no foreign bodies/material noted, no muscle damage noted, no nerve damage noted, no tendon damage noted, no underlying fracture noted and no vascular damage noted     Contaminated: no   Treatment:    Area cleansed with:  Betadine   Amount of cleaning:  Extensive   Irrigation solution:  Sterile saline   Irrigation method:  Pressure wash Skin repair:    Repair method:  Steri-Strips   Number of Steri-Strips:  3 Approximation:    Approximation:  Close Post-procedure details:    Dressing:  Open (no dressing)   Patient tolerance of procedure:  Tolerated well, no immediate complications   (including critical care time)  Medications Ordered in ED Medications  Tdap (BOOSTRIX) injection 0.5 mL (0.5 mLs Intramuscular Given 06/21/19 1502)   ED Course  I have reviewed the triage vital signs and the nursing notes.  Pertinent labs & imaging results that were available during my care of the patient were reviewed by me and considered in my medical decision making (see chart for details).  59 year old male appears otherwise well presents for evaluation after mechanical fall which occurred 18 hours PTA.  He is currently on Plavix.  He is afebrile, nonseptic, non-ill-appearing.  Patient denies sudden onset thunderclap headache, dizziness, lightheadedness, chest pain or shortness of breath prior to fall.  This occurred as he was stepping out of a high truck.  Patient did note laceration to his right eyebrow.  He is unsure of his last tetanus.  There is crusted blood to his right eyebrow however I am unable to assess underlying laceration due to crusting.  We will plan on nursing cleaning and reevaluate.  Patient also has significant swelling and ecchymosis to his right thigh.  I am unable to open his right eye due to swelling.  Patient denies any pain to his eye.  He does have some mild light white, clear drainage around the inferior aspect of his eye and over his moving glands to his right  eye.  Patient denied prior pruritus, drainage, eye pain prior to fall.  He denies any eye pain currently.  Patient states prior to swelling he did not note any vision changes to his eyes.  Neurovascularly intact.  Normal musculoskeletal exam.  We will plan on imaging head, neck, max face and reevaluate.  50mm laceration with steri strips placed 2/2 >18 hours out from injury. No bleeding or drainage.  Unfortunately patient with extended wait due to high acuity trauma requiring CT. His right eye is still swollen shut. Unable to assess for eye injury due to swelling. Patient will need to be evaluated by Opthalmology outpatient is imaging negative and he is able to dc home once his eye swelling is resolved.  Care transferred to [facility], PA-C who will follow up on CT imaging and disposition.   MDM Rules/Calculators/A&P                       Final Clinical Impression(s) / ED Diagnoses Final diagnoses:  Fall, initial encounter  Facial laceration, initial encounter  Swelling of eye, right    Rx / DC Orders ED Discharge Orders    None       Sherrell Farish A, PA-C 06/21/19 1808    Wynetta Fines, MD 06/24/19 1507

## 2019-06-21 NOTE — ED Provider Notes (Signed)
59yo male with mechanical fall 18 hours ago, on Plavix. Patient is awaiting CT images. Td updated. Plan is to dc home if CT scans negative for bleed with plan for follow up with ophthalmology when swelling to eye improves for further evaluation.  Physical Exam  BP (!) 151/79 (BP Location: Right Arm)   Pulse 77   Temp 98.5 F (36.9 C) (Oral)   Resp 14   SpO2 100%   Physical Exam Alert, speech clear, swelling to right orbital area. ED Course/Procedures     Procedures  MDM  CT head, maxillofacial, C-spine without significant acute injury, findings consistent with exam of right periorbital hematoma.  Discussed results with patient, plan is for discharge, take Tylenol as needed for pain and follow-up with ophthalmology in the next week.       Jeannie Fend, PA-C 06/21/19 [place]    Tilden Fossa, MD 06/21/19 2012

## 2019-06-21 NOTE — ED Notes (Signed)
The pt is  Just waiting for the results of the c-t

## 2019-06-21 NOTE — ED Notes (Signed)
Pt waiting for c-t 

## 2019-06-21 NOTE — Discharge Instructions (Addendum)
Need to follow-up with ophthalmology once the swelling in your right eye has resolved, likely on Monday. The Steri-Strips over your right eyebrow will gradually fall off as you let warm soapy water run over this. Do not remove or pick at this area. Your tetanus vaccine was updated.

## 2019-06-21 NOTE — ED Notes (Signed)
Pt has been waiting  For 2 hours for c-t  I contacted c-t  The pt is on the list still but it may be one more hour before they can  Do his scans

## 2019-06-24 ENCOUNTER — Encounter (INDEPENDENT_AMBULATORY_CARE_PROVIDER_SITE_OTHER): Payer: Self-pay | Admitting: Primary Care

## 2019-06-24 ENCOUNTER — Other Ambulatory Visit: Payer: Self-pay

## 2019-06-24 ENCOUNTER — Ambulatory Visit (INDEPENDENT_AMBULATORY_CARE_PROVIDER_SITE_OTHER): Payer: Medicaid Other | Admitting: Primary Care

## 2019-06-24 DIAGNOSIS — I10 Essential (primary) hypertension: Secondary | ICD-10-CM

## 2019-06-24 DIAGNOSIS — E782 Mixed hyperlipidemia: Secondary | ICD-10-CM | POA: Diagnosis not present

## 2019-06-24 DIAGNOSIS — I639 Cerebral infarction, unspecified: Secondary | ICD-10-CM

## 2019-06-24 DIAGNOSIS — Z720 Tobacco use: Secondary | ICD-10-CM

## 2019-06-24 DIAGNOSIS — Z09 Encounter for follow-up examination after completed treatment for conditions other than malignant neoplasm: Secondary | ICD-10-CM | POA: Diagnosis not present

## 2019-06-24 NOTE — Progress Notes (Signed)
Virtual Visit via Telephone Note  I connected with Nathan Hernandez on 06/24/19 at  2:50 PM EST by telephone and verified that I am speaking with the correct person using two identifiers.   I discussed the limitations, risks, security and privacy concerns of performing an evaluation and management service by telephone and the availability of in person appointments. I also discussed with the patient that there may be a patient responsible charge related to this service. The patient expressed understanding and agreed to proceed.   History of Present Illness: Nathan Hernandez is having a tele visit for hospital follow up - fall stepping out of a truck. On anticoagulation medication. Scans in ED to rule out bleeding and hyperlipidemia requesting medication refilled on Zetia and atorvastatin . He voices no other problems or Concerns.  Past Medical History:  Diagnosis Date  . HLD (hyperlipidemia)   . Hypertension   . Osteoarthritis   . Polysubstance abuse ([facility])   . Stroke [facility])    Current Outpatient Medications on File Prior to Visit  Medication Sig Dispense Refill  . aspirin 325 MG tablet Take 1 tablet (325 mg total) by mouth daily. 30 tablet 5  . clopidogrel (PLAVIX) 75 MG tablet TAKE 1 TABLET (75 MG TOTAL) BY MOUTH DAILY. 30 tablet 3  . ezetimibe (ZETIA) 10 MG tablet TAKE 1 TABLET (10 MG TOTAL) BY MOUTH DAILY. (Patient taking differently: Take 10 mg by mouth daily at 6 PM. ) 30 tablet 3  . Aspirin-Acetaminophen-Caffeine (GOODY HEADACHE PO) Take 2 packets by mouth daily as needed (headache/pain).    Marland Kitchen atorvastatin (LIPITOR) 80 MG tablet TAKE 1 TABLET (80 MG TOTAL) BY MOUTH DAILY AT 6 PM. (Patient not taking: Reported on 06/24/2019) 30 tablet 0  . ibuprofen (ADVIL) 200 MG tablet Take 400-600 mg by mouth every 6 (six) hours as needed for headache (pain).     No current facility-administered medications on file prior to visit.     Observations/Objective: Review of Systems  All other systems  reviewed and are negative.   Assessment and Plan: Nathan Hernandez was seen today for hyperlipidemia and medication refill.  Diagnoses and all orders for this visit:  Essential hypertension Counseled on blood pressure goal of less than 130/80, low-sodium, DASH diet, medication compliance, 150 minutes of moderate intensity exercise per week. Discussed medication compliance, adverse effects.  Cerebrovascular accident (CVA), unspecified mechanism (HCC) History of with right sided CVA with left sided arm and leg weakness. Managed by Neurology   Mixed hyperlipidemia No recent labs since 12/03/18 Encourage patient to come in any morning for fasting labs . Than refill medication . Discussed  decrease your fatty foods, red meat, cheese, milk and increase fiber like whole grains and veggies. You can also add a fiber supplement like Metamucil or Benefiber. Future lipids , CMP, cbc   Tobacco abuse Aware of  increased risk for lung cancer and other respiratory diseases recommend cessation.  This will be reminded at each clinical visit.     Follow Up Instructions:    I discussed the assessment and treatment plan with the patient. The patient was provided an opportunity to ask questions and all were answered. The patient agreed with the plan and demonstrated an understanding of the instructions.   The patient was advised to call back or seek an in-person evaluation if the symptoms worsen or if the condition fails to improve as anticipated.  I provided 15 minutes of non-face-to-face time during this encounter.   Grayce Sessions, NP

## 2019-06-25 DIAGNOSIS — S0181XA Laceration without foreign body of other part of head, initial encounter: Secondary | ICD-10-CM | POA: Diagnosis not present

## 2019-06-25 DIAGNOSIS — H2513 Age-related nuclear cataract, bilateral: Secondary | ICD-10-CM | POA: Diagnosis not present

## 2019-06-25 DIAGNOSIS — H1131 Conjunctival hemorrhage, right eye: Secondary | ICD-10-CM | POA: Diagnosis not present

## 2019-07-02 ENCOUNTER — Ambulatory Visit (INDEPENDENT_AMBULATORY_CARE_PROVIDER_SITE_OTHER): Payer: Medicaid Other

## 2019-07-02 ENCOUNTER — Other Ambulatory Visit (INDEPENDENT_AMBULATORY_CARE_PROVIDER_SITE_OTHER): Payer: Self-pay | Admitting: Primary Care

## 2019-07-02 ENCOUNTER — Other Ambulatory Visit: Payer: Self-pay

## 2019-07-02 ENCOUNTER — Other Ambulatory Visit (INDEPENDENT_AMBULATORY_CARE_PROVIDER_SITE_OTHER): Payer: Medicaid Other

## 2019-07-02 DIAGNOSIS — E782 Mixed hyperlipidemia: Secondary | ICD-10-CM | POA: Diagnosis not present

## 2019-07-02 DIAGNOSIS — I10 Essential (primary) hypertension: Secondary | ICD-10-CM

## 2019-07-02 DIAGNOSIS — Z23 Encounter for immunization: Secondary | ICD-10-CM

## 2019-07-02 DIAGNOSIS — Z125 Encounter for screening for malignant neoplasm of prostate: Secondary | ICD-10-CM

## 2019-07-03 LAB — COMPREHENSIVE METABOLIC PANEL
ALT: 15 IU/L (ref 0–44)
AST: 18 IU/L (ref 0–40)
Albumin/Globulin Ratio: 1.8 (ref 1.2–2.2)
Albumin: 4.8 g/dL (ref 3.8–4.9)
Alkaline Phosphatase: 83 IU/L (ref 39–117)
BUN/Creatinine Ratio: 10 (ref 9–20)
BUN: 9 mg/dL (ref 6–24)
Bilirubin Total: 0.5 mg/dL (ref 0.0–1.2)
CO2: 20 mmol/L (ref 20–29)
Calcium: 9.5 mg/dL (ref 8.7–10.2)
Chloride: 102 mmol/L (ref 96–106)
Creatinine, Ser: 0.88 mg/dL (ref 0.76–1.27)
GFR calc Af Amer: 109 mL/min/{1.73_m2} (ref >59)
GFR calc non Af Amer: 95 mL/min/{1.73_m2} (ref >59)
Globulin, Total: 2.7 g/dL (ref 1.5–4.5)
Glucose: 97 mg/dL (ref 65–99)
Potassium: 4.2 mmol/L (ref 3.5–5.2)
Sodium: 138 mmol/L (ref 134–144)
Total Protein: 7.5 g/dL (ref 6.0–8.5)

## 2019-07-03 LAB — LIPID PANEL
Chol/HDL Ratio: 2.7 ratio (ref 0.0–5.0)
Cholesterol, Total: 198 mg/dL (ref 100–199)
HDL: 74 mg/dL (ref >39)
LDL Chol Calc (NIH): 112 mg/dL — ABNORMAL HIGH (ref 0–99)
Triglycerides: 67 mg/dL (ref 0–149)
VLDL Cholesterol Cal: 12 mg/dL (ref 5–40)

## 2019-07-03 LAB — PSA: Prostate Specific Ag, Serum: 1.4 ng/mL (ref 0.0–4.0)

## 2019-07-03 LAB — CBC WITH DIFFERENTIAL/PLATELET
Basophils Absolute: 0 10*3/uL (ref 0.0–0.2)
Basos: 1 %
EOS (ABSOLUTE): 0.3 10*3/uL (ref 0.0–0.4)
Eos: 5 %
Hematocrit: 46.7 % (ref 37.5–51.0)
Hemoglobin: 15.4 g/dL (ref 13.0–17.7)
Immature Grans (Abs): 0 10*3/uL (ref 0.0–0.1)
Immature Granulocytes: 0 %
Lymphocytes Absolute: 2.1 10*3/uL (ref 0.7–3.1)
Lymphs: 39 %
MCH: 28.2 pg (ref 26.6–33.0)
MCHC: 33 g/dL (ref 31.5–35.7)
MCV: 86 fL (ref 79–97)
Monocytes Absolute: 0.9 10*3/uL (ref 0.1–0.9)
Monocytes: 17 %
Neutrophils Absolute: 2.1 10*3/uL (ref 1.4–7.0)
Neutrophils: 38 %
Platelets: 319 10*3/uL (ref 150–450)
RBC: 5.46 x10E6/uL (ref 4.14–5.80)
RDW: 14.4 % (ref 11.6–15.4)
WBC: 5.4 10*3/uL (ref 3.4–10.8)

## 2019-07-04 ENCOUNTER — Other Ambulatory Visit (INDEPENDENT_AMBULATORY_CARE_PROVIDER_SITE_OTHER): Payer: Self-pay | Admitting: Primary Care

## 2019-07-04 DIAGNOSIS — E782 Mixed hyperlipidemia: Secondary | ICD-10-CM

## 2019-07-04 MED FILL — EZETIMIBE 10 MG TAB: 10 | 30 days supply | Qty: 30 | Fill #2

## 2019-07-04 MED FILL — CLOPIDOGREL 75 MG TABLET: 75 | 30 days supply | Qty: 30 | Fill #2

## 2019-07-04 MED FILL — ATORVASTATIN 80 MG TABLET: 80 | 30 days supply | Qty: 30 | Fill #0

## 2019-07-05 ENCOUNTER — Other Ambulatory Visit (INDEPENDENT_AMBULATORY_CARE_PROVIDER_SITE_OTHER): Payer: Self-pay | Admitting: Primary Care

## 2019-07-05 MED ORDER — EZETIMIBE 10 MG PO TABS: 10.0000 mg | ORAL_TABLET | Freq: Every day | ORAL | 1 refills | Status: DC

## 2019-07-19 ENCOUNTER — Ambulatory Visit (INDEPENDENT_AMBULATORY_CARE_PROVIDER_SITE_OTHER): Payer: Medicaid Other

## 2019-07-19 ENCOUNTER — Other Ambulatory Visit: Payer: Self-pay

## 2019-07-19 ENCOUNTER — Ambulatory Visit: Payer: Medicaid Other | Admitting: Orthopedic Surgery

## 2019-07-19 DIAGNOSIS — M25562 Pain in left knee: Secondary | ICD-10-CM | POA: Diagnosis not present

## 2019-07-19 DIAGNOSIS — M25512 Pain in left shoulder: Secondary | ICD-10-CM

## 2019-07-19 DIAGNOSIS — G8929 Other chronic pain: Secondary | ICD-10-CM | POA: Diagnosis not present

## 2019-07-20 ENCOUNTER — Encounter: Payer: Self-pay | Admitting: Orthopedic Surgery

## 2019-07-20 NOTE — Progress Notes (Signed)
Office Visit Note   Patient: Nathan Hernandez           Date of Birth: 1961/02/16           MRN: [id number] Visit Date: 07/19/2019 Requested by: Kerin Perna, NP [address] [place],  [place] [address] PCP: Kerin Perna, NP  Subjective: Chief Complaint  Patient presents with  . Left Shoulder - Pain    HPI: [place] is a patient with left shoulder pain.  Describes worsening pain.  He was doing physical therapy in 2019 for frozen shoulder did well but now has had some recurrent stiffness.  Does not wake him from sleep at night.  Does not have diabetes or thyroid problem.  He has had a stroke that is affected his left side which affects movement on that side.  He also describes left knee pain for years.  He had left knee arthroscopy with partial medial meniscectomy 1995.  Currently he is not working.              ROS: All systems reviewed are negative as they relate to the chief complaint within the history of present illness.  Patient denies  fevers or chills.   Assessment & Plan: Visit Diagnoses:  1. Left shoulder pain, unspecified chronicity   2. Chronic pain of left knee     Plan: Impression is left shoulder pain with some limitation of motion but it may be more related to his stroke as opposed to a true frozen shoulder.  Rotator cuff strength is pretty reasonable.  Plan is divided intra-articular glenohumeral joint injection and subacromial injection.  Next regarding the left knee he has pretty severe end-stage arthritis in the left and right knee.  We talked about alternating cortisone and gel injections for the left knee.  He will consider that as a future option.  He will need knee replacement at sometime in the future whether to be partial or complete really depends on his symptoms and the amount of laxity he has to valgus stress.  Follow-up as needed.  We also will send him to physical therapy once just for home exercise program to focus on shoulder range of motion  exercises.  Follow-Up Instructions: Return if symptoms worsen or fail to improve.   Orders:  Orders Placed This Encounter  Procedures  . XR Shoulder Left  . XR KNEE 3 VIEW LEFT   No orders of the defined types were placed in this encounter.     Procedures: No procedures performed   Clinical Data: No additional findings.  Objective: Vital Signs: There were no vitals taken for this visit.  Physical Exam:   Constitutional: Patient appears well-developed HEENT:  Head: Normocephalic Eyes:EOM are normal Neck: Normal range of motion Cardiovascular: Normal rate Pulmonary/chest: Effort normal Neurologic: Patient is alert Skin: Skin is warm Psychiatric: Patient has normal mood and affect    Ortho Exam: Orthopedic exam demonstrates full active and passive range of motion of the right shoulder.  On the left-hand side he is got about 10 degrees less forward flexion on the left compared to the right.  External rotation also resolved by about 10 degrees left versus right.  Cuff strength is good infraspinatus supraspinatus and subscap muscle testing.  This is on the left-hand side.  Also no coarse grinding or crepitus with active or passive range of motion of that left shoulder.  Specialty Comments:  No specialty comments available.  Imaging: XR KNEE 3 VIEW LEFT  Result Date: 07/20/2019 AP  outlet axillary left knee reviewed.  End-stage tricompartmental arthritis is present worse in the medial compartment.  Patellofemoral compartment relatively spared.  Varus alignment is present.  Bone quality appears good.  Arthritis is most severe in the right knee medial compartment compared to the left knee medial compartment.  XR Shoulder Left  Result Date: 07/20/2019 AP outlet axillary left shoulder reviewed.  No glenohumeral joint or AC joint arthritis is present.  Acromiohumeral distance maintained.  No acute fracture.  Shoulder is located.  Normal left shoulder    PMFS  History: Patient Active Problem List   Diagnosis Date Noted  . Carotid artery disease (HCC) 11/25/2017  . Carotid stenosis 11/24/2017  . Hyperlipidemia 11/21/2017  . Marijuana use 11/21/2017  . Acute bilat watershed infarction [facility]) 11/20/2017  . Dyslipidemia 11/20/2017  . Hypertension 11/20/2017  . Polysubstance abuse ([facility]) 11/20/2017  . HLD (hyperlipidemia) 05/20/2014  . Osteoarthritis of right knee 11/27/2013  . Establishing care with new doctor, encounter for 04/05/2013  . Poor vision 04/05/2013  . High blood pressure 04/05/2013  . Knee pain, acute 04/05/2013   Past Medical History:  Diagnosis Date  . HLD (hyperlipidemia)   . Hypertension   . Osteoarthritis   . Polysubstance abuse ([facility])   . Stroke [facility])     Family History  Problem Relation Age of Onset  . Diabetes Mother   . Hypertension Mother   . Hypertension Father   . Diabetes Father     Past Surgical History:  Procedure Laterality Date  . GROIN DEBRIDEMENT Left 11/24/2017   Procedure: EVACUATION OF LEFT GROIN HEMATOMA, REPAIR OF LEFT FEMORAL ARTERY BRACH;  Surgeon: Larina Earthly, MD;  Location: [facility] OR;  Service: Vascular;  Laterality: Left;  . KNEE SURGERY    . TRANSCAROTID ARTERY REVASCULARIZATION (TCAR)  11/24/2017  . TRANSCAROTID ARTERY REVASCULARIZATION Right 11/24/2017   Procedure: TRANSCAROTID ARTERY REVASCULARIZATION;  Surgeon: Nada Libman, MD;  Location[facility];  Service: Vascular;  Laterality: Right;   Social History   Occupational History  . Not on file  Tobacco Use  . Smoking status: Light Tobacco Smoker    Packs/day: 1.00    Types: Cigars  . Smokeless tobacco: Never Used  Substance and Sexual Activity  . Alcohol use: Yes    Alcohol/week: 1.0 standard drinks    Types: 1 Cans of beer per week    Comment: 2 to 3 per week  . Drug use: Yes    Frequency: 7.0 times per week    Types: Marijuana    Comment: once a month usually for pain   . Sexual activity: Not on file

## 2019-07-22 ENCOUNTER — Other Ambulatory Visit: Payer: Self-pay

## 2019-07-22 DIAGNOSIS — M7502 Adhesive capsulitis of left shoulder: Secondary | ICD-10-CM

## 2019-07-22 DIAGNOSIS — M25512 Pain in left shoulder: Secondary | ICD-10-CM

## 2019-08-05 MED FILL — CLOPIDOGREL 75 MG TABLET: 75 | 30 days supply | Qty: 30 | Fill #3

## 2019-08-05 MED FILL — EZETIMIBE 10 MG TAB: 10 | 30 days supply | Qty: 30 | Fill #3

## 2019-08-05 MED FILL — ATORVASTATIN 80 MG TABLET: 80 | 30 days supply | Qty: 30 | Fill #1

## 2019-08-06 ENCOUNTER — Other Ambulatory Visit: Payer: Self-pay

## 2019-08-06 ENCOUNTER — Ambulatory Visit: Payer: Medicaid Other | Attending: Orthopedic Surgery | Admitting: Physical Therapy

## 2019-08-06 ENCOUNTER — Encounter: Payer: Self-pay | Admitting: Physical Therapy

## 2019-08-06 DIAGNOSIS — M6281 Muscle weakness (generalized): Secondary | ICD-10-CM | POA: Insufficient documentation

## 2019-08-06 DIAGNOSIS — G8929 Other chronic pain: Secondary | ICD-10-CM | POA: Diagnosis not present

## 2019-08-06 DIAGNOSIS — M25512 Pain in left shoulder: Secondary | ICD-10-CM | POA: Insufficient documentation

## 2019-08-06 DIAGNOSIS — M25612 Stiffness of left shoulder, not elsewhere classified: Secondary | ICD-10-CM | POA: Diagnosis not present

## 2019-08-06 DIAGNOSIS — R2681 Unsteadiness on feet: Secondary | ICD-10-CM | POA: Insufficient documentation

## 2019-08-06 DIAGNOSIS — R278 Other lack of coordination: Secondary | ICD-10-CM | POA: Diagnosis not present

## 2019-08-06 DIAGNOSIS — I69354 Hemiplegia and hemiparesis following cerebral infarction affecting left non-dominant side: Secondary | ICD-10-CM | POA: Insufficient documentation

## 2019-08-06 DIAGNOSIS — R2689 Other abnormalities of gait and mobility: Secondary | ICD-10-CM | POA: Insufficient documentation

## 2019-08-07 ENCOUNTER — Encounter: Payer: Self-pay | Admitting: Physical Therapy

## 2019-08-07 NOTE — Therapy (Signed)
[facility] [place], [place], [address] Phone: [phone]   Fax:  [phone]  Physical Therapy Evaluation  Patient Details  Name: Nathan Hernandez MRN: [id number] Date of Birth: [birth year] Referring Provider (PT): Dr Meredith Pel    Encounter Date: 08/06/2019  PT End of Session - 08/06/19 1208    Visit Number  1    Number of Visits  4    Date for PT Re-Evaluation  09/03/19    Authorization Type  Medicaid    Authorization Time Period  resubmitted 05/01/18 for 4 additional visits one time per week for 4 more weeks    Authorization - Visit Number  1    Authorization - Number of Visits  4    PT Start Time  1150    PT Stop Time  1233    PT Time Calculation (min)  43 min    Activity Tolerance  Patient tolerated treatment well    Behavior During Therapy  [facility] for tasks assessed/performed       Past Medical History:  Diagnosis Date  . HLD (hyperlipidemia)   . Hypertension   . Osteoarthritis   . Polysubstance abuse ([place])   . Stroke [facility])     Past Surgical History:  Procedure Laterality Date  . GROIN DEBRIDEMENT Left 11/24/2017   Procedure: EVACUATION OF LEFT GROIN HEMATOMA, REPAIR OF LEFT FEMORAL ARTERY BRACH;  Surgeon: Rosetta Posner, MD;  Location: [place];  Service: Vascular;  Laterality: Left;  . KNEE SURGERY    . TRANSCAROTID ARTERY REVASCULARIZATION (TCAR)  11/24/2017  . TRANSCAROTID ARTERY REVASCULARIZATION Right 11/24/2017   Procedure: TRANSCAROTID ARTERY REVASCULARIZATION;  Surgeon: Serafina Mitchell, MD;  Location: [facility] OR;  Service: Vascular;  Laterality: Right;    There were no vitals filed for this visit.   Subjective Assessment - 08/06/19 1155    Subjective  Patient has a history of left shoulder pain starting in 2018. He had a shot in it and it improved. The painb stsarted coming back again and last Wedenesday he had naother shot in the shoulder. That has helped as well. He is still haveing some  weakness and poain in the shoulder at this time.    Pertinent History  HTN, B watershed infarction, HLD, polysubstance abuse; left knee OA    Limitations  Walking    How long can you walk comfortably?  20-30 min    Patient Stated Goals  reduce reliance on cane    Currently in Pain?  Yes    Pain Score  2     Pain Orientation  Left    Pain Descriptors / Indicators  Aching    Pain Type  Chronic pain    Pain Onset  More than a month ago    Pain Frequency  Constant    Aggravating Factors   stiffens at night; when he reaches up too much    Pain Relieving Factors  rest and tylenol,         [facility] PT Assessment - 08/07/19 0001      Assessment   Medical Diagnosis  Left Shoulder Pain     Referring Provider (PT)  Dr Meredith Pel     Onset Date/Surgical Date  --   2018 the shoulder pain began    Hand Dominance  Right    Prior Therapy  PT/OT following an acute CVA       Precautions   Precautions  None  Restrictions   Weight Bearing Restrictions  No      Home Environment   Living Environment  Private residence    Type of [place]  One level      Prior Function   Level of [place] with basic ADLs      Cognition   Overall Cognitive Status  Within Functional Limits for tasks assessed      Sensation   Light Touch  Appears Intact    Additional Comments  denies parathesias       Coordination   Gross Motor Movements are Fluid and Coordinated  Yes    Fine Motor Movements are Fluid and Coordinated  Yes      Posture/Postural Control   Posture Comments  rounded shoulders, forward head       AROM   Left Shoulder Flexion  130 Degrees    Left Shoulder ABduction  --   no pain    Left Shoulder Internal Rotation  --   mild pain to L3      PROM   Overall PROM Comments  pain with end range PROM and end range flexion       Strength   Left Shoulder Flexion  4+/5    Left Shoulder Internal Rotation  5/5     Left Shoulder External Rotation  4+/5      Palpation   Palpation comment  no significant tendernress to palpation                 Objective measurements completed on examination: See above findings.      [facility] Adult PT Treatment/Exercise - 08/07/19 0001      Shoulder Exercises: Supine   Other Supine Exercises  wand x10 wan ir x10; wand er x10       Shoulder Exercises: Standing   External Rotation  Left;10 reps    Theraband Level (Shoulder External Rotation)  Level 2 (Red)    Internal Rotation  10 reps;Right    Theraband Level (Shoulder Internal Rotation)  Level 2 (Red)    Extension  10 reps    Theraband Level (Shoulder Extension)  Level 2 (Red)    Row  10 reps    Theraband Level (Shoulder Row)  Level 2 (Red)             PT Education - 08/06/19 1200    Education Details  reviewed HEp and symptom mangement    Person(s) Educated  Patient    Methods  Explanation;Demonstration;Verbal cues;Tactile cues    Comprehension  Verbalized understanding;Verbal cues required;Tactile cues required;Returned demonstration       PT Short Term Goals - 08/07/19 0800      PT SHORT TERM GOAL #1   Title  Patient will deomnstrate full active right shouldr flexion    Baseline  130 degrees    Time  3    Period  Weeks    Status  New    Target Date  09/04/19      PT SHORT TERM GOAL #2   Title  Patient will increase gross left shoulder strength to 5/5    Baseline  right flexion and ER 4+/5    Time  3    Period  Weeks    Status  New    Target Date  08/28/19      PT SHORT TERM GOAL #3   Title  Patient will reach overhead  without pain    Baseline  pain reaching to 90 degrees    Time  3    Period  Weeks    Status  New    Target Date  08/28/19        PT Long Term Goals - 05/01/18 1424      PT LONG TERM GOAL #1   Title  patient to be independent with HEP for LE strength and balance     Baseline  becoming compliant, will update to more higher level balance training     Time  3    Period  Weeks    Status  Partially Met      PT LONG TERM GOAL #2   Title  Patient to improve FGA by >/= 4 points     Baseline  19/30    Time  3    Period  Weeks    Status  Achieved      PT LONG TERM GOAL #3   Title  Patient to demonstrate appropriate gait mechanics with SPC demonstrating improved functional mobility with LRAD    Time  3    Period  Weeks    Status  Achieved      PT LONG TERM GOAL #4   Title  Patient to improve 5x sit to stand by >/= 5 seconds for improved balance and mobility    Baseline  16.05    Time  3    Period  Weeks    Status  Achieved      PT LONG TERM GOAL #5   Title  patient to improve gait speed by >/= 0.5 ft/sec    Baseline  4 ft/sec    Time  3    Period  Weeks    Status  Achieved      Additional Long Term Goals   Additional Long Term Goals  Yes      PT LONG TERM GOAL #6   Title  New goal added 05/01/18: Pt will be able to ambulate community distances at least 500 ft on even and uneven surface, ramp, and curb without AD.     Time  4    Period  Weeks    Target Date  05/29/18             Plan - 08/06/19 1628    Clinical Impression Statement  Patient is a 59 year old male who presents with shoulder stiffness. He had pain but that has improved since his injection. He had success with his shot a year ago but his pain came back. He was given a stretching and progressive strengthening program for home. he would like to come back for a few visits to follow up and progress his exercises. He islacking end range motion. He would benefit from skilled therapy to improve patients ability to manage shoulder stiffness.    Personal Factors and Comorbidities  Comorbidity 1;Comorbidity 3+;Comorbidity 2    Comorbidities  past CVA effecting left side, polysubstance abuse; Knee OA effecting general mobility    Examination-Activity Limitations  Bathing;Reach Overhead;Carry    Examination-Participation Restrictions  Community Activity     Stability/Clinical Decision Making  Evolving/Moderate complexity    Clinical Decision Making  Moderate    Rehab Potential  Good    PT Frequency  1x / week    PT Duration  3 weeks    PT Treatment/Interventions  ADLs/Self Care Home Management;Electrical Stimulation;Moist Heat;Therapeutic exercise;Therapeutic activities;Functional mobility training;Stair training;Gait training;DME Instruction;Balance training;Neuromuscular re-education;Patient/family education;Manual techniques;Vasopneumatic  Device;Taping;Passive range of motion;Cryotherapy    PT Next Visit Plan  continue high level balance and strength    PT Home Exercise Plan  wand flex/er/ir t-band ER/IR/scpa retraction/ shoulder extension    Consulted and Agree with Plan of Care  Patient       Patient will benefit from skilled therapeutic intervention in order to improve the following deficits and impairments:  Decreased activity tolerance, Decreased coordination, Decreased mobility, Difficulty walking, Decreased strength, Pain, Impaired UE functional use, Decreased endurance, Decreased range of motion  Visit Diagnosis: Stiffness of left shoulder, not elsewhere classified  Chronic left shoulder pain     Problem List Patient Active Problem List   Diagnosis Date Noted  . Carotid artery disease ([place]) 11/25/2017  . Carotid stenosis 11/24/2017  . Hyperlipidemia 11/21/2017  . Marijuana use 11/21/2017  . Acute bilat watershed infarction [facility]) 11/20/2017  . Dyslipidemia 11/20/2017  . Hypertension 11/20/2017  . Polysubstance abuse ([place]) 11/20/2017  . HLD (hyperlipidemia) 05/20/2014  . Osteoarthritis of right knee 11/27/2013  . Establishing care with new doctor, encounter for 04/05/2013  . Poor vision 04/05/2013  . High blood pressure 04/05/2013  . Knee pain, acute 04/05/2013    Carney Living 08/07/2019, 8:19 AM  [facility] [address] [place], [place], [address] Phone:  [phone]   Fax:  [phone]  Name: Nathan Hernandez MRN: [id number] Date of Birth: [birth year]

## 2019-08-13 DIAGNOSIS — S0181XS Laceration without foreign body of other part of head, sequela: Secondary | ICD-10-CM | POA: Diagnosis not present

## 2019-08-15 ENCOUNTER — Ambulatory Visit: Payer: Medicaid Other

## 2019-08-15 ENCOUNTER — Other Ambulatory Visit: Payer: Self-pay

## 2019-08-15 DIAGNOSIS — R278 Other lack of coordination: Secondary | ICD-10-CM | POA: Diagnosis not present

## 2019-08-15 DIAGNOSIS — G8929 Other chronic pain: Secondary | ICD-10-CM

## 2019-08-15 DIAGNOSIS — R2689 Other abnormalities of gait and mobility: Secondary | ICD-10-CM

## 2019-08-15 DIAGNOSIS — I69354 Hemiplegia and hemiparesis following cerebral infarction affecting left non-dominant side: Secondary | ICD-10-CM

## 2019-08-15 DIAGNOSIS — M25612 Stiffness of left shoulder, not elsewhere classified: Secondary | ICD-10-CM | POA: Diagnosis not present

## 2019-08-15 DIAGNOSIS — M6281 Muscle weakness (generalized): Secondary | ICD-10-CM | POA: Diagnosis not present

## 2019-08-15 DIAGNOSIS — M25512 Pain in left shoulder: Secondary | ICD-10-CM | POA: Diagnosis not present

## 2019-08-15 DIAGNOSIS — R2681 Unsteadiness on feet: Secondary | ICD-10-CM | POA: Diagnosis not present

## 2019-08-15 NOTE — Patient Instructions (Signed)
Vertical wand mulligan flexion stretch

## 2019-08-15 NOTE — Therapy (Addendum)
[facility] [place], [place], [address] Phone: [phone]   Fax:  [phone]  Physical Therapy Treatment/Discharge  Patient Details  Name: Nathan Hernandez MRN: [id number] Date of Birth: 30-Apr-1961 Referring Provider (PT): Dr Meredith Pel    Encounter Date: 08/15/2019  PT End of Session - 08/15/19 1543    Visit Number  2    Number of Visits  4    Date for PT Re-Evaluation  09/03/19    Authorization Type  Medicaid    Authorization - Visit Number  2    Authorization - Number of Visits  4    PT Start Time  2751    PT Stop Time  1530    PT Time Calculation (min)  45 min    Activity Tolerance  Patient tolerated treatment well    Behavior During Therapy  [facility] for tasks assessed/performed       Past Medical History:  Diagnosis Date  . HLD (hyperlipidemia)   . Hypertension   . Osteoarthritis   . Polysubstance abuse ([place])   . Stroke [facility])     Past Surgical History:  Procedure Laterality Date  . GROIN DEBRIDEMENT Left 11/24/2017   Procedure: EVACUATION OF LEFT GROIN HEMATOMA, REPAIR OF LEFT FEMORAL ARTERY BRACH;  Surgeon: Rosetta Posner, MD;  Location: [place];  Service: Vascular;  Laterality: Left;  . KNEE SURGERY    . TRANSCAROTID ARTERY REVASCULARIZATION (TCAR)  11/24/2017  . TRANSCAROTID ARTERY REVASCULARIZATION Right 11/24/2017   Procedure: TRANSCAROTID ARTERY REVASCULARIZATION;  Surgeon: Serafina Mitchell, MD;  Location: [facility] OR;  Service: Vascular;  Laterality: Right;    There were no vitals filed for this visit.  Subjective Assessment - 08/15/19 1453    Subjective  Pt reports primarily stiffness with his L shoulder, but does have occasional pain with reaching up and back. rates a 3/10 with those tinges.                       [facility] Adult PT Treatment/Exercise - 08/15/19 0001      Exercises   Exercises  Shoulder      Shoulder Exercises: Supine   External Rotation  AROM;10  reps;Limitations    External Rotation Limitations  cane, 3 x 1- sec holds    Internal Rotation  --    Internal Rotation Limitations  --    Flexion  AROM;10 reps;Limitations    Flexion Limitations  cane; 3x10 sec    Other Supine Exercises  cane ext; 10 reps pressure on/off; 3x 10 sec;     Other Supine Exercises  scapular retractions c blue tband 10x2; hsoulder 4 way yellow tband 10x2      Shoulder Exercises: ROM/Strengthening   UBE (Upper Arm Bike)  2.5 minutes each direction      Shoulder Exercises: Stretch   Other Shoulder Stretches  Mulligan shoulder flexion stretch c cane 3x30 sec             PT Education - 08/15/19 1541    Education Details  Positioning recommendation for sleep to reduce L shoulder strain       PT Short Term Goals - 08/07/19 0800      PT SHORT TERM GOAL #1   Title  Patient will deomnstrate full active right shouldr flexion    Baseline  130 degrees    Time  3    Period  Weeks    Status  New    Target Date  09/04/19      PT SHORT TERM GOAL #2   Title  Patient will increase gross left shoulder strength to 5/5    Baseline  right flexion and ER 4+/5    Time  3    Period  Weeks    Status  New    Target Date  08/28/19      PT SHORT TERM GOAL #3   Title  Patient will reach overhead without pain    Baseline  pain reaching to 90 degrees    Time  3    Period  Weeks    Status  New    Target Date  08/28/19        PT Long Term Goals - 05/01/18 1424      PT LONG TERM GOAL #1   Title  patient to be independent with HEP for LE strength and balance     Baseline  becoming compliant, will update to more higher level balance training    Time  3    Period  Weeks    Status  Partially Met      PT LONG TERM GOAL #2   Title  Patient to improve FGA by >/= 4 points     Baseline  19/30    Time  3    Period  Weeks    Status  Achieved      PT LONG TERM GOAL #3   Title  Patient to demonstrate appropriate gait mechanics with SPC demonstrating improved  functional mobility with LRAD    Time  3    Period  Weeks    Status  Achieved      PT LONG TERM GOAL #4   Title  Patient to improve 5x sit to stand by >/= 5 seconds for improved balance and mobility    Baseline  16.05    Time  3    Period  Weeks    Status  Achieved      PT LONG TERM GOAL #5   Title  patient to improve gait speed by >/= 0.5 ft/sec    Baseline  4 ft/sec    Time  3    Period  Weeks    Status  Achieved      Additional Long Term Goals   Additional Long Term Goals  Yes      PT LONG TERM GOAL #6   Title  New goal added 05/01/18: Pt will be able to ambulate community distances at least 500 ft on even and uneven surface, ramp, and curb without AD.     Time  4    Period  Weeks    Target Date  05/29/18            Plan - 08/15/19 1545    Clinical Impression Statement  Pt reports he has been completing his HEP. Pt performed ther exs c verbal and tactile corrections with pt then returning demonstration. L shoulder pain appears to be managed with introduction to AROM and strengthening exs.    PT Treatment/Interventions  ADLs/Self Care Home Management;Electrical Stimulation;Moist Heat;Therapeutic exercise;Therapeutic activities;Functional mobility training;Stair training;Gait training;DME Instruction;Balance training;Neuromuscular re-education;Patient/family education;Manual techniques;Vasopneumatic Device;Taping;Passive range of motion;Cryotherapy    PT Next Visit Plan  Progress Ther ex for ROM and strengthening as indicated. Utililize L shoulder jt mobs as indicated.    PT Home Exercise Plan  Mulligan verical ward flexion stretch added       Patient will benefit from skilled therapeutic intervention in order to  improve the following deficits and impairments:  Decreased activity tolerance, Decreased coordination, Decreased mobility, Difficulty walking, Decreased strength, Pain, Impaired UE functional use, Decreased endurance, Decreased range of motion  Visit  Diagnosis: Stiffness of left shoulder, not elsewhere classified  Muscle weakness (generalized)  Chronic left shoulder pain  Unsteadiness on feet  Acute pain of left shoulder  Other abnormalities of gait and mobility  Hemiplegia and hemiparesis following cerebral infarction affecting left non-dominant side (HCC)  Other lack of coordination     Problem List Patient Active Problem List   Diagnosis Date Noted  . Carotid artery disease ([place]) 11/25/2017  . Carotid stenosis 11/24/2017  . Hyperlipidemia 11/21/2017  . Marijuana use 11/21/2017  . Acute bilat watershed infarction [facility]) 11/20/2017  . Dyslipidemia 11/20/2017  . Hypertension 11/20/2017  . Polysubstance abuse ([place]) 11/20/2017  . HLD (hyperlipidemia) 05/20/2014  . Osteoarthritis of right knee 11/27/2013  . Establishing care with new doctor, encounter for 04/05/2013  . Poor vision 04/05/2013  . High blood pressure 04/05/2013  . Knee pain, acute 04/05/2013    [facility] [address] [place], [place], [address] Phone: [phone]   Fax:  [phone]  Name: Nathan Hernandez MRN: [id number] Date of Birth: 12/18/1960   Gar Ponto MS, PT 08/15/19 3:56 PM   PHYSICAL THERAPY DISCHARGE SUMMARY  Visits from Start of Care: 2  Current functional level related to goals / functional outcomes Unknown pt. stopped coming   Remaining deficits: Unknown   Education / Equipment: HEP Plan:                                                    Patient goals were not met. Patient is being discharged due to not returning since the last visit.  ?????    .    Nathan Halleck MS, PT 10/08/19 2:40 PM

## 2019-08-21 ENCOUNTER — Ambulatory Visit: Payer: Medicaid Other | Admitting: Physical Therapy

## 2019-08-22 ENCOUNTER — Other Ambulatory Visit: Payer: Self-pay

## 2019-08-22 ENCOUNTER — Ambulatory Visit (INDEPENDENT_AMBULATORY_CARE_PROVIDER_SITE_OTHER): Payer: Medicaid Other | Admitting: Orthopedic Surgery

## 2019-08-22 DIAGNOSIS — M1712 Unilateral primary osteoarthritis, left knee: Secondary | ICD-10-CM

## 2019-08-23 ENCOUNTER — Encounter: Payer: Self-pay | Admitting: Orthopedic Surgery

## 2019-08-23 DIAGNOSIS — M1712 Unilateral primary osteoarthritis, left knee: Secondary | ICD-10-CM

## 2019-08-23 MED ORDER — LIDOCAINE HCL 1 % IJ SOLN
5.0000 mL | INTRAMUSCULAR | Status: AC | PRN
  Administered 2019-08-23: 5 mL

## 2019-08-23 MED ORDER — BUPIVACAINE HCL 0.25 % IJ SOLN
4.0000 mL | INTRAMUSCULAR | Status: AC | PRN
  Administered 2019-08-23: 4 mL via INTRA_ARTICULAR

## 2019-08-23 MED ORDER — METHYLPREDNISOLONE ACETATE 40 MG/ML IJ SUSP
40.0000 mg | INTRAMUSCULAR | Status: AC | PRN
  Administered 2019-08-23: 40 mg via INTRA_ARTICULAR

## 2019-08-23 NOTE — Progress Notes (Signed)
Office Visit Note   Patient: Nathan Hernandez           Date of Birth: [birth year]           MRN: [id number] Visit Date: 08/22/2019 Requested by: Kerin Perna, NP [address] [place],  [place] [address] PCP: Kerin Perna, NP  Subjective: Chief Complaint  Patient presents with  . Left Knee - Pain    HPI: Obrien Huskins is a 59 y.o. male who presents to the office complaining of left knee pain.  Patient notes increased pain left knee over the last several weeks.  He has had difficulty weightbearing and has to ambulate with a cane.  He notes a history of left knee arthritis, complaining that his left knee is "bone-on-bone".  He has had increased swelling over the last 2 weeks.  He notes stiffness in the morning that gets better throughout the day.  His knee occasionally gives out on him.  He wears a light weight knee brace that provides some support.  He is not taking any medication for pain.  He has had 1 prior knee injection about 6 years ago provided good relief..                ROS:  All systems reviewed are negative as they relate to the chief complaint within the history of present illness.  Patient denies fevers or chills.  Assessment & Plan: Visit Diagnoses:  1. Unilateral primary osteoarthritis, left knee     Plan: Patient is a 59 year old male who presents complaining of left knee pain.  He has end-stage tricompartmental arthritis, worst in the medial compartment.  He has had increased swelling over the last several weeks along with increased pain.  He has had good relief from the 1 injection he had in his knee about 6 years ago.  We will proceed with another left knee injection today, per patient's request.  Patient's knee was aspirated of about 55 cc of fluid and injected with cortisone.  We will preapprove patient for gel injections.  Patient was also provided with a hinged knee brace today.  He will follow-up for left knee gel injections whenever the cortisone  injection wears off.  Patient agrees with plan.  This patient is diagnosed with osteoarthritis of the knee(s).    Radiographs show evidence of joint space narrowing, osteophytes, subchondral sclerosis and/or subchondral cysts.  This patient has knee pain which interferes with functional and activities of daily living.    This patient has experienced inadequate response, adverse effects and/or intolerance with conservative treatments such as acetaminophen, NSAIDS, topical creams, physical therapy or regular exercise, knee bracing and/or weight loss.   This patient has experienced inadequate response or has a contraindication to intra articular steroid injections for at least 3 months.   This patient is not scheduled to have a total knee replacement within 6 months of starting treatment with viscosupplementation.   Follow-Up Instructions: No follow-ups on file.   Orders:  No orders of the defined types were placed in this encounter.  No orders of the defined types were placed in this encounter.     Procedures: Large Joint Inj: L knee on 08/23/2019 8:13 PM Indications: diagnostic evaluation, joint swelling and pain Details: 18 G 1.5 in needle, superolateral approach  Arthrogram: No  Medications: 5 mL lidocaine 1 %; 40 mg methylPREDNISolone acetate 40 MG/ML; 4 mL bupivacaine 0.25 % Outcome: tolerated well, no immediate complications Procedure, treatment alternatives, risks and benefits explained, specific risks  discussed. Consent was given by the patient. Immediately prior to procedure a time out was called to verify the correct patient, procedure, equipment, support staff and site/side marked as required. Patient was prepped and draped in the usual sterile fashion.       Clinical Data: No additional findings.  Objective: Vital Signs: There were no vitals taken for this visit.  Physical Exam:  Constitutional: Patient appears well-developed HEENT:  Head:  Normocephalic Eyes:EOM are normal Neck: Normal range of motion Cardiovascular: Normal rate Pulmonary/chest: Effort normal Neurologic: Patient is alert Skin: Skin is warm Psychiatric: Patient has normal mood and affect  Ortho Exam:  Left knee Exam Tender to palpation over the medial and lateral joint lines, most over the medial Significant effusion Extensor mechanism intact No TTP over the quad tendon, patellar tendon, pes anserinus, patella, tibial tubercle, LCL/MCL insertions Stable to varus/valgus stresses.  Stable to anterior/posterior drawer Extension to 0 degrees Flexion > 90 degrees  Specialty Comments:  No specialty comments available.  Imaging: No results found.   PMFS History: Patient Active Problem List   Diagnosis Date Noted  . Carotid artery disease (HCC) 11/25/2017  . Carotid stenosis 11/24/2017  . Hyperlipidemia 11/21/2017  . Marijuana use 11/21/2017  . Acute bilat watershed infarction [facility]) 11/20/2017  . Dyslipidemia 11/20/2017  . Hypertension 11/20/2017  . Polysubstance abuse (H[facility]) 11/20/2017  . HLD (hyperlipidemia) 05/20/2014  . Osteoarthritis of right knee 11/27/2013  . Establishing care with new doctor, encounter for 04/05/2013  . Poor vision 04/05/2013  . High blood pressure 04/05/2013  . Knee pain, acute 04/05/2013   Past Medical History:  Diagnosis Date  . HLD (hyperlipidemia)   . Hypertension   . Osteoarthritis   . Polysubstance abuse ([facility])   . Stroke [facility])     Family History  Problem Relation Age of Onset  . Diabetes Mother   . Hypertension Mother   . Hypertension Father   . Diabetes Father     Past Surgical History:  Procedure Laterality Date  . GROIN DEBRIDEMENT Left 11/24/2017   Procedure: EVACUATION OF LEFT GROIN HEMATOMA, REPAIR OF LEFT FEMORAL ARTERY BRACH;  Surgeon: Larina Earthly, MD;  Location: [facility] OR;  Service: Vascular;  Laterality: Left;  . KNEE SURGERY    . TRANSCAROTID ARTERY REVASCULARIZATION (TCAR)  11/24/2017  .  TRANSCAROTID ARTERY REVASCULARIZATION Right 11/24/2017   Procedure: TRANSCAROTID ARTERY REVASCULARIZATION;  Surgeon: Nada Libman, MD;  Location: [facility] OR;  Service: Vascular;  Laterality: Right;   Social History   Occupational History  . Not on file  Tobacco Use  . Smoking status: Light Tobacco Smoker    Packs/day: 1.00    Types: Cigars  . Smokeless tobacco: Never Used  Substance and Sexual Activity  . Alcohol use: Yes    Alcohol/week: 1.0 standard drinks    Types: 1 Cans of beer per week    Comment: 2 to 3 per week  . Drug use: Yes    Frequency: 7.0 times per week    Types: Marijuana    Comment: once a month usually for pain   . Sexual activity: Not on file

## 2019-09-05 ENCOUNTER — Other Ambulatory Visit: Payer: Self-pay | Admitting: Adult Health

## 2019-09-05 DIAGNOSIS — I639 Cerebral infarction, unspecified: Secondary | ICD-10-CM

## 2019-09-05 MED FILL — EZETIMIBE 10 MG TAB: 10 | 90 days supply | Qty: 90 | Fill #0

## 2019-09-05 MED FILL — ATORVASTATIN 80 MG TABLET: 80 | 30 days supply | Qty: 30 | Fill #2

## 2019-09-05 MED FILL — CLOPIDOGREL 75 MG TABLET: 75 | 30 days supply | Qty: 30 | Fill #0

## 2019-09-05 NOTE — Telephone Encounter (Signed)
Refilled x 2 months with note to pharmacy: needs to schedule FU.

## 2019-09-17 IMAGING — MR MR CERVICAL SPINE W/O CM
9 of 16 series · 21 of 48 positions shown · non-contrast
Comparison: Head CT 11/20/2017

CLINICAL DATA: Left-sided weakness for several months. Numbness and
tingling in the extremities.

EXAM:
MRI HEAD WITHOUT CONTRAST
MRA HEAD WITHOUT CONTRAST
MRI CERVICAL SPINE WITHOUT CONTRAST
TECHNIQUE: Multiplanar, multiecho pulse sequences of the brain and surrounding
structures, and cervical spine, to include the craniocervical
junction and cervicothoracic junction, were obtained without
intravenous contrast. Angiographic images of the head were obtained
using MRA technique without contrast.

[Series 3: DWI · axial · 3.0mm · 1.09mm/px · z∈[-31,+112]mm · 5 of 98 slices shown (1 of 2)]
[im 1/98]
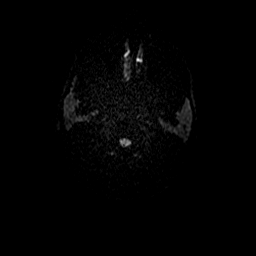
[im 25/98]
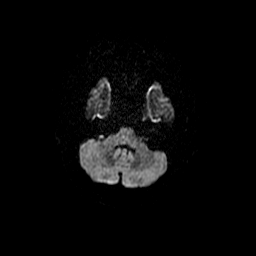
[im 49/98]
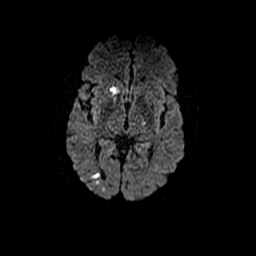
[im 73/98]
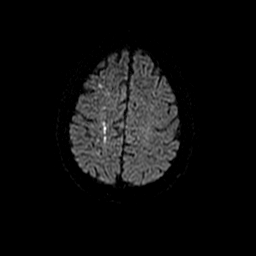
[im 98/98]
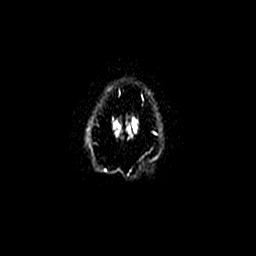

[Series 4: DWI · coronal · 5.0mm · 1.09mm/px · 4 of 70 slices shown (2 of 2)]
[im 1/70]
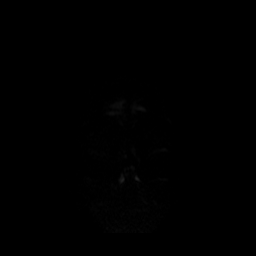
[im 24/70]
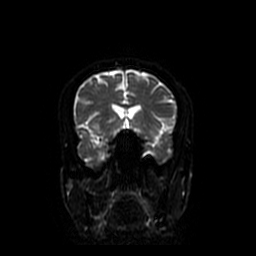
[im 47/70]
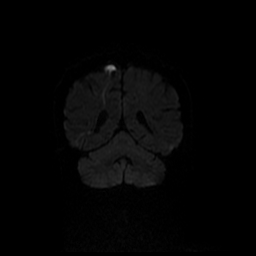
[im 70/70]
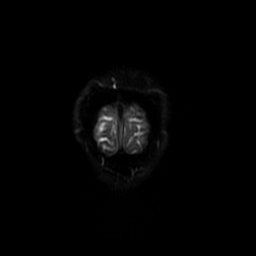

[Series 5: T1 · sagittal · 5.0mm · 0.47mm/px · 1 of 23 slices shown (1 of 2)]
[im 1/23]
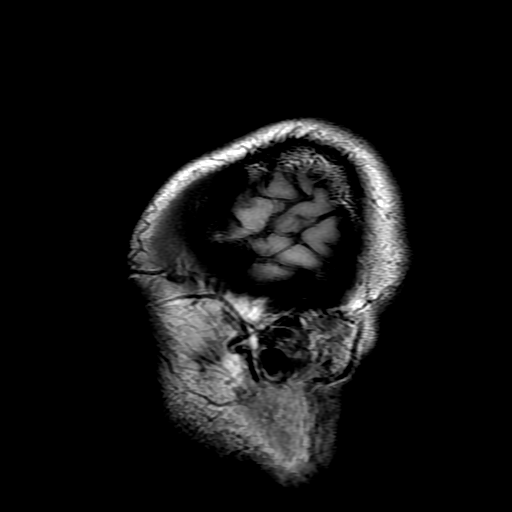

[Series 6: T2 · axial · 5.0mm · 0.43mm/px · z∈[-25,+112]mm · 2 of 24 slices shown (1 of 4)]
[im 1/24]
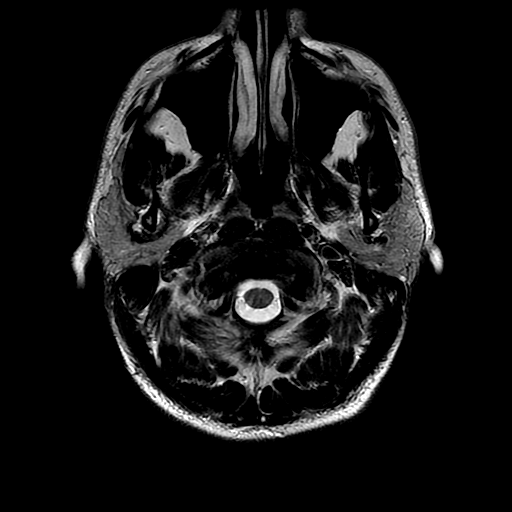
[im 24/24]
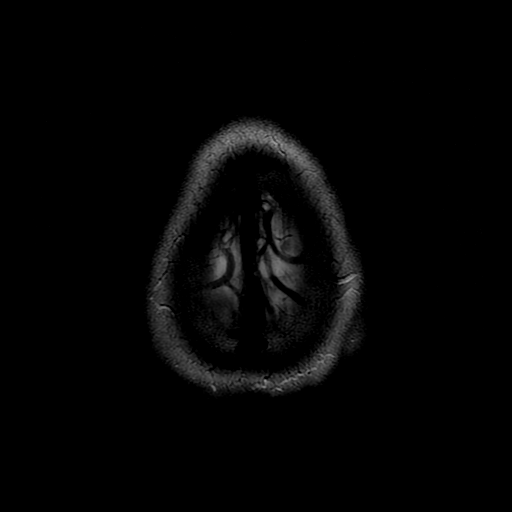

[Series 7: FLAIR · axial · 5.0mm · 0.43mm/px · z∈[-25,+112]mm · 2 of 24 slices shown]
[im 1/24]
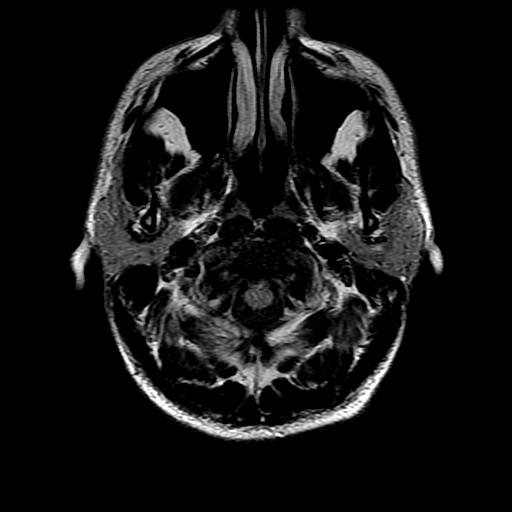
[im 24/24]
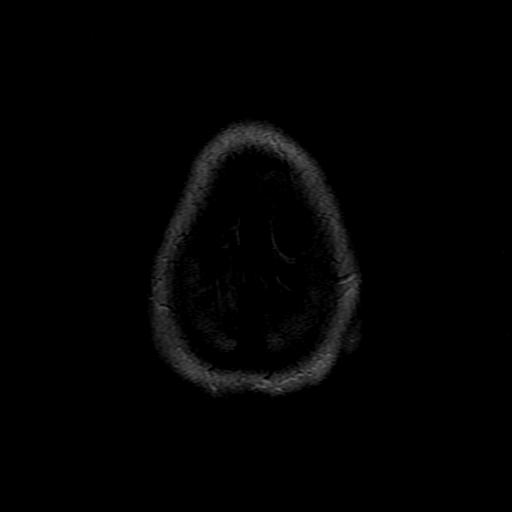

[Series 12: T2 · coronal · 5.0mm · 0.43mm/px · 2 of 30 slices shown (2 of 4)]
[im 1/30]
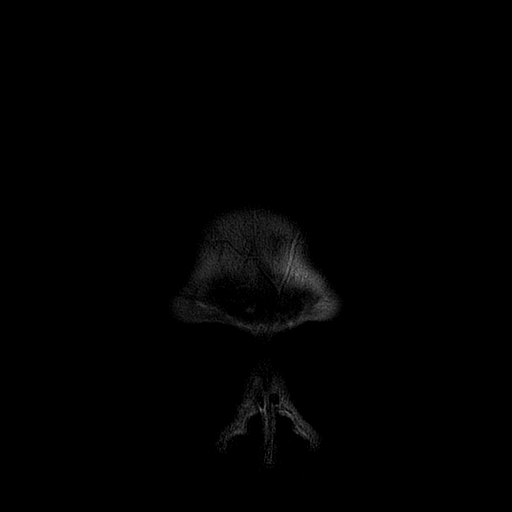
[im 30/30]
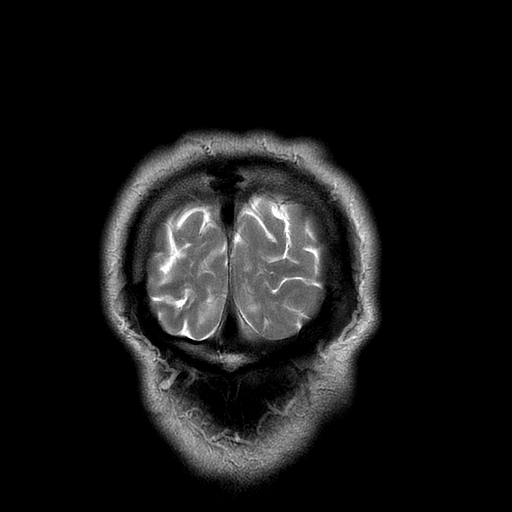

[Series 13: T2 · sagittal · 3.0mm · 0.43mm/px · 1 of 13 slices shown (3 of 4)]
[im 1/13]
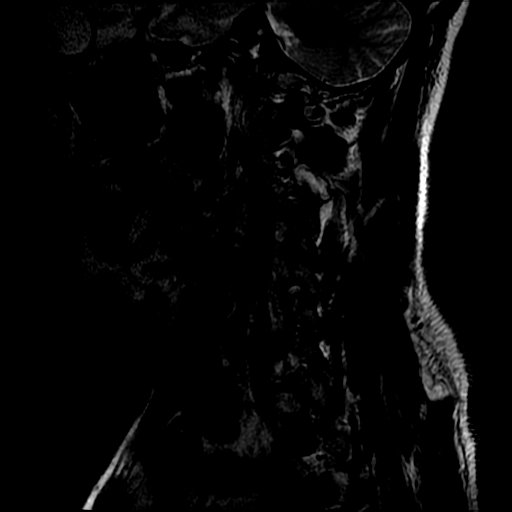

[Series 14: T1 · sagittal · 3.0mm · 0.43mm/px · 1 of 13 slices shown (2 of 2)]
[im 1/13]
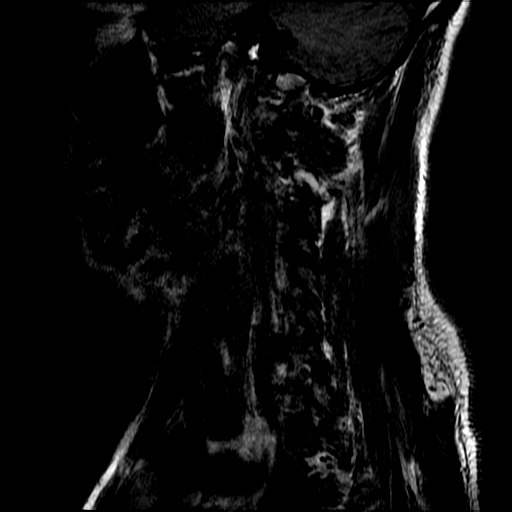

[Series 16: T2 · axial · 3.0mm · 0.39mm/px · z∈[-203,-79]mm · 3 of 38 slices shown (4 of 4)]
[im 1/38]
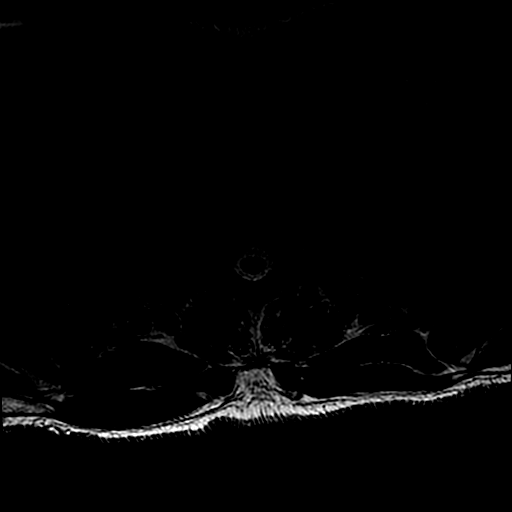
[im 19/38]
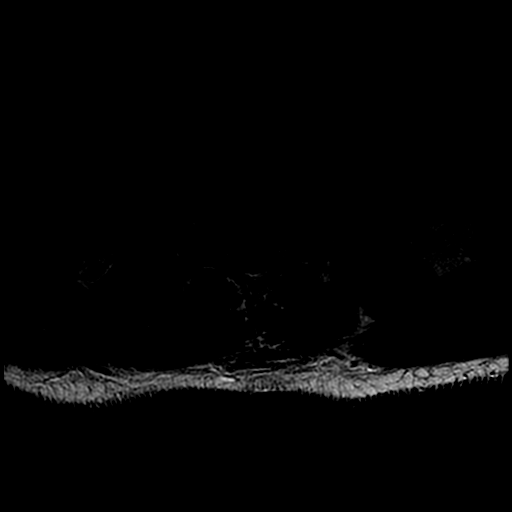
[im 38/38]
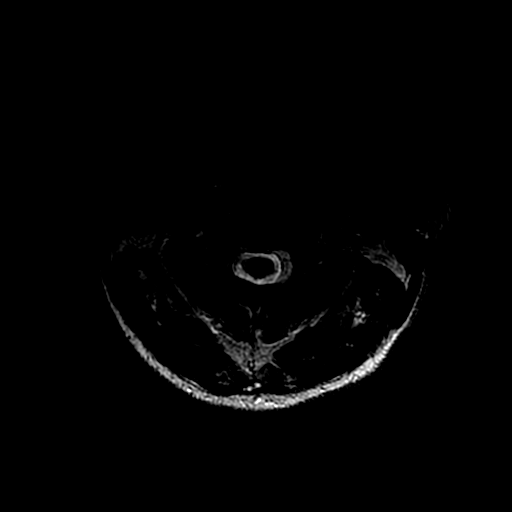

[21 of 48 positions shown; findings below may reference images not displayed]

FINDINGS: MRI HEAD FINDINGS

Brain: Small scattered acute to early subacute cortical and white
matter infarcts are present in the right cerebral hemisphere which
appear to largely be along superficial and deep watershed zones. The
largest confluent area of infarction involves 2 cm of right
frontoparietal cortex at the vertex, corresponding to the
abnormality described on CT. Multiple sagittally oriented infarcts
are present in the right centrum semiovale. Small right
parieto-occipital cortical infarcts are noted in the posterior
MCA/watershed territory. There are a couple of punctate acute right
temporal lobe cortical infarcts, and there is a small acute infarct
involving the right basal ganglia and anterior limb of internal
capsule. No acute left cerebral hemispheric or posterior fossa
infarcts are identified.

Chronic microhemorrhages are noted in the posterior left frontal
lobe at the level of the corona radiata and in the right cerebellum.
There are small cortical infarcts in the posterior right temporal
lobe and lateral right occipital lobe as well as small white matter
infarcts in the right centrum semiovale which have a more late
subacute to chronic appearance. Chronic lacunar infarcts are also
noted in the basal ganglia bilaterally and in the right pons. The
ventricles are normal in size. No mass, midline shift, or
extra-axial fluid collection is present.

Vascular: Abnormal appearance of the distal right internal carotid
artery with some is centric.

Skull and upper cervical spine: Unremarkable bone marrow signal.

Sinuses/Orbits: Unremarkable orbits. Minimal mucosal thickening in
the frontal, ethmoid, and right sphenoid sinuses. Clear mastoid air
cells.

Other: None.

MRA HEAD FINDINGS

The visualized distal vertebral arteries are patent to the basilar
and codominant. There is mild left V4 stenosis at the
vertebrobasilar junction. Patent PICA, AICA, and SCA origins are
visualized bilaterally. The basilar artery is patent with motion
artifact through its proximal portion and no evidence of significant
stenosis. There is a predominantly fetal type origin of the right
PCA, possibly with a diminutive and diseased right P1 segment. The
large right posterior communicating artery is widely patent, as is
the left PCA.

The included distal cervical and intracranial segments of both
internal carotid arteries are patent to the carotid termini.
However, the right ICA is diffusely small in caliber compared to the
left. There is abnormal signal diffusely surrounding the right ICA
as it enters the skull base as well as throughout the petrous
portion. Flow related enhancement is mildly diminished throughout
the distal right petrous ICA extending to the petrous-cavernous
junction, and there is superimposed mild additional narrowing
throughout this region. The ACAs and MCAs are patent without
evidence of proximal branch occlusion or significant proximal
stenosis, although the right ACA and right MCA appears slightly
attenuated with less robust flow diffusely compared to the
contralateral side. No aneurysm is identified.

MRI CERVICAL SPINE FINDINGS

The study is mildly motion degraded.

Alignment: Cervical spine straightening.  No significant listhesis.

Vertebrae: No fracture, suspicious osseous lesion, or significant
marrow edema. Type 2 degenerative endplate changes from C5-T1.

Cord: Normal signal and morphology.

Posterior Fossa, vertebral arteries, paraspinal tissues: Preserved
vertebral artery flow voids. Abnormal appearance of the proximal
right ICA flow void. Unremarkable paraspinal soft tissues.

Disc levels:

C2-3: Asymmetric right facet arthrosis with likely ankylosis across
the joint. Mild uncovertebral spurring. Mild right neural foraminal
stenosis. No spinal stenosis.

C3-4: Moderate disc space narrowing. Disc bulging, uncovertebral
spurring, and mild facet arthrosis result in mild spinal stenosis
and moderate to severe bilateral neural foraminal stenosis.

C4-5: Mild disc bulging, mild uncovertebral spurring, and mild facet
arthrosis result in mild-to-moderate right and moderate left neural
foraminal stenosis and borderline spinal stenosis.

C5-6: Right greater than left uncovertebral hypertrophy results in
severe right and moderate to severe left neural foraminal stenosis.
Minimal disc bulging does not result in significant spinal stenosis.

C6-7: Uncovertebral spurring results in moderate to severe bilateral
neural foraminal stenosis. No spinal stenosis.

C7-T1: Uncovertebral spurring and mild disc bulging result in
moderate to severe bilateral neural foraminal stenosis without
spinal stenosis.

T1-2: Bilateral foraminal disc protrusions result in moderate
bilateral neural foraminal stenosis without spinal stenosis.
IMPRESSION: 1. Multiple small infarcts throughout the right cerebral hemisphere
primarily in a watershed distribution and varying in age from acute
to late subacute/chronic.
2. Chronic bilateral basal ganglia and pontine lacunar infarcts.
3. Diffusely abnormal appearance of the distal cervical and proximal
intracranial portions of the right ICA including mild diffuse
narrowing. Head and neck CTA is recommended to further evaluate for
a possible dissection or flow limiting proximal stenosis.
4. Diffuse cervical spondylosis with moderate to severe multilevel
neural foraminal stenosis as above.
5. Mild spinal stenosis at C3-4.

## 2019-10-10 ENCOUNTER — Other Ambulatory Visit (INDEPENDENT_AMBULATORY_CARE_PROVIDER_SITE_OTHER): Payer: Self-pay | Admitting: Family Medicine

## 2019-10-10 DIAGNOSIS — E782 Mixed hyperlipidemia: Secondary | ICD-10-CM

## 2019-10-10 MED FILL — CLOPIDOGREL 75 MG TABLET: 75 | 30 days supply | Qty: 30 | Fill #1

## 2019-10-16 ENCOUNTER — Other Ambulatory Visit: Payer: Self-pay | Admitting: *Deleted

## 2019-10-16 DIAGNOSIS — I6523 Occlusion and stenosis of bilateral carotid arteries: Secondary | ICD-10-CM

## 2019-10-18 ENCOUNTER — Telehealth (HOSPITAL_COMMUNITY): Payer: Self-pay

## 2019-10-18 NOTE — Telephone Encounter (Signed)

## 2019-10-21 ENCOUNTER — Other Ambulatory Visit: Payer: Self-pay

## 2019-10-21 ENCOUNTER — Ambulatory Visit (HOSPITAL_COMMUNITY)
Admission: RE | Admit: 2019-10-21 | Discharge: 2019-10-21 | Disposition: A | Discharge status: Home / Self Care | Payer: Medicaid Other | Source: Ambulatory Visit | Attending: Surgery | Admitting: Surgery

## 2019-10-21 DIAGNOSIS — I6523 Occlusion and stenosis of bilateral carotid arteries: Secondary | ICD-10-CM | POA: Insufficient documentation

## 2019-10-23 ENCOUNTER — Ambulatory Visit (INDEPENDENT_AMBULATORY_CARE_PROVIDER_SITE_OTHER): Payer: Medicaid Other | Admitting: Physician Assistant

## 2019-10-23 ENCOUNTER — Other Ambulatory Visit: Payer: Self-pay

## 2019-10-23 VITALS — Temp 98.0°F | Ht 72.0 in | Wt 180.0 lb

## 2019-10-23 DIAGNOSIS — I6523 Occlusion and stenosis of bilateral carotid arteries: Secondary | ICD-10-CM | POA: Diagnosis not present

## 2019-10-23 NOTE — Progress Notes (Signed)
Virtual Visit via Telephone Note  I connected with Steele Stracener on 10/23/2019  by telephone and verified that I was speaking with the correct person using two identifiers. Patient was located at home. I am located at [facility] office.   The limitations of evaluation and management by telemedicine and the availability of in person appointments have been previously discussed with the patient and are documented in the patients chart. The patient expressed understanding and consented to proceed.  PCP: Grayce Sessions, NP  Chief Complaint: carotid stenosis  History of Present Illness: Nathan Hernandez is a 59 y.o. male with history of right TCAR by Dr. Myra Gianotti on 11/24/17. At the time he had symptomatic stenosis with CVA resulting in left arm and leg weakness. His surgery was complicated by post op hematoma and retroperitoneal bleed. With Physical therapy his left sided weakness has mostly resolved.  He presents today for annual follow up and carotid duplex. He denies any TIA or stroke like symptoms. He denies any headaches, dizziness, changes in vision, weakness or numbness of upper or lower extremities, or difficulty speaking. He continues to take Aspirin, Plavix and Statin.  He does have issues with his left knee which have been present for several years. He recently had a fall due to this and was seen in the ER earlier this year with a periorbital hematoma. He had no intracranial bleed. He denies any dizziness or loss of consciousness at the time of this event. Fall was purely mechanical in nature. No recent falls   Past Medical History:  Diagnosis Date  . HLD (hyperlipidemia)   . Hypertension   . Osteoarthritis   . Polysubstance abuse (HCC)   . Stroke [facility])     Past Surgical History:  Procedure Laterality Date  . GROIN DEBRIDEMENT Left 11/24/2017   Procedure: EVACUATION OF LEFT GROIN HEMATOMA, REPAIR OF LEFT FEMORAL ARTERY BRACH;  Surgeon: Larina Earthly, MD;  Location: [facility] OR;   Service: Vascular;  Laterality: Left;  . KNEE SURGERY    . TRANSCAROTID ARTERY REVASCULARIZATION (TCAR)  11/24/2017  . TRANSCAROTID ARTERY REVASCULARIZATION Right 11/24/2017   Procedure: TRANSCAROTID ARTERY REVASCULARIZATION;  Surgeon: Nada Libman, MD;  Location: [facility] OR;  Service: Vascular;  Laterality: Right;    Current Meds  Medication Sig  . aspirin 325 MG tablet Take 1 tablet (325 mg total) by mouth daily.  . Aspirin-Acetaminophen-Caffeine (GOODY HEADACHE PO) Take 2 packets by mouth daily as needed (headache/pain).  Marland Kitchen atorvastatin (LIPITOR) 80 MG tablet TAKE 1 TABLET (80 MG TOTAL) BY MOUTH DAILY AT 6 PM.  . clopidogrel (PLAVIX) 75 MG tablet TAKE 1 TABLET (75 MG TOTAL) BY MOUTH DAILY.  Marland Kitchen ezetimibe (ZETIA) 10 MG tablet Take 1 tablet (10 mg total) by mouth daily.  Marland Kitchen ibuprofen (ADVIL) 200 MG tablet Take 400-600 mg by mouth every 6 (six) hours as needed for headache (pain).    12 system ROS was negative unless otherwise noted in HPI   Observations/Objective: VAS Carotid Duplex Bilaterally 10/21/19 Summary:  Right Carotid: Patent internal carotid artery stent with no evidence of restenosis in the right ICA.  Left Carotid: Velocities in the left ICA are consistent with a 40-59% stenosis.  Vertebrals: Bilateral vertebral arteries demonstrate antegrade flow.  Subclavians: Normal flow hemodynamics were seen in bilateral subclavian arteries.    Assessment and Plan: S/p right TCAR for symptomatic ICA stenosis with CVA. Remains asymptomatic since. He has had no new neurological changes and denies any TIA or CVA symptoms. His carotid  duplex shows patient right ICA stent and minimal left 40-59% stenosis in the left ICA which has been stable. He is on Aspirin, Plavix and Statin. Advised him to follow up earlier if he has any new issues or concerns. Discussed concerning signs and symptoms of stroke and he knows to present to ER immediately should any of these occur. He will return for follow up  in 1 year with carotid duplex  Follow Up Instructions:   Follow up in 1 year(s)   I discussed the assessment and treatment plan with the patient. The patient was provided an opportunity to ask questions and all were answered. The patient agreed with the plan and demonstrated an understanding of the instructions.   The patient was advised to call back or seek an in-person evaluation if the symptoms worsen or if the condition fails to improve as anticipated.  I spent 12 minutes with the patient via telephone encounter.   Signed, Karoline Caldwell [facility] Office: [phone]  10/23/2019, 9:22 AM

## 2019-11-07 ENCOUNTER — Other Ambulatory Visit: Payer: Self-pay | Admitting: Adult Health

## 2019-11-07 DIAGNOSIS — I639 Cerebral infarction, unspecified: Secondary | ICD-10-CM

## 2019-11-12 ENCOUNTER — Other Ambulatory Visit: Payer: Self-pay | Admitting: Primary Care

## 2019-11-12 DIAGNOSIS — I639 Cerebral infarction, unspecified: Secondary | ICD-10-CM

## 2019-11-12 NOTE — Telephone Encounter (Signed)
Duplicate

## 2019-11-12 NOTE — Telephone Encounter (Signed)
Sent to PCP ?

## 2019-12-06 MED FILL — EZETIMIBE 10 MG TAB: 10 | 90 days supply | Qty: 90 | Fill #1

## 2019-12-17 MED FILL — CLOPIDOGREL 75 MG TABLET: 75 | 30 days supply | Qty: 30 | Fill #1

## 2020-01-03 ENCOUNTER — Other Ambulatory Visit (INDEPENDENT_AMBULATORY_CARE_PROVIDER_SITE_OTHER): Payer: Self-pay | Admitting: Family Medicine

## 2020-01-03 DIAGNOSIS — E782 Mixed hyperlipidemia: Secondary | ICD-10-CM

## 2020-01-03 MED FILL — ATORVASTATIN 80 MG TABLET: 80 | 90 days supply | Qty: 90 | Fill #0

## 2020-01-20 ENCOUNTER — Other Ambulatory Visit: Payer: Self-pay | Admitting: Primary Care

## 2020-01-20 DIAGNOSIS — I639 Cerebral infarction, unspecified: Secondary | ICD-10-CM

## 2020-01-20 NOTE — Telephone Encounter (Signed)
Requested medication (s) are due for refill today: no  Requested medication (s) are on the active medication list: yes  Last refill:  12/17/2019  Future visit scheduled: no  Notes to clinic:  overdue for follow up appointment     Requested Prescriptions  Pending Prescriptions Disp Refills   clopidogrel (PLAVIX) 75 MG tablet [Pharmacy Med Name: CLOPIDOGREL 75 MG TABLET 75 Tablet] 30 tablet 1    Sig: TAKE 1 TABLET (75 MG TOTAL) BY MOUTH DAILY. CALL AND SCHEDULE A FOLLOW UP APPT WITH MD      There is no refill protocol information for this order

## 2020-01-23 MED FILL — CLOPIDOGREL 75 MG TABLET: 75 | 30 days supply | Qty: 30 | Fill #0

## 2020-02-19 ENCOUNTER — Other Ambulatory Visit: Payer: Self-pay

## 2020-02-19 ENCOUNTER — Encounter (INDEPENDENT_AMBULATORY_CARE_PROVIDER_SITE_OTHER): Payer: Self-pay | Admitting: Primary Care

## 2020-02-19 ENCOUNTER — Ambulatory Visit (INDEPENDENT_AMBULATORY_CARE_PROVIDER_SITE_OTHER): Payer: Self-pay | Admitting: Primary Care

## 2020-02-19 VITALS — BP 112/72 | HR 50 | Temp 98.6°F | Ht 72.0 in | Wt 181.4 lb

## 2020-02-19 DIAGNOSIS — M24849 Other specific joint derangements of unspecified hand, not elsewhere classified: Secondary | ICD-10-CM

## 2020-02-19 DIAGNOSIS — I10 Essential (primary) hypertension: Secondary | ICD-10-CM

## 2020-02-19 NOTE — Progress Notes (Signed)
Carpal tunnel flare up a couple weeks ago- has never had this issue. But has a brother with same diagnosis

## 2020-02-19 NOTE — Progress Notes (Signed)
Established Patient Office Visit  Subjective:  Patient ID: Nathan Hernandez, male    DOB: [birth year]  Age: 59 y.o. MRN: [id number]  CC:  Chief Complaint  Patient presents with  . Carpal Tunnel  . Medication Refill    ezetimibe     HPI Nathan Hernandez presents for carpal tunnel flare up on going for approximately 2 weeks a couple weeks. Concerns with gripping, extension with fingers causing pain.   Past Medical History:  Diagnosis Date  . HLD (hyperlipidemia)   . Hypertension   . Osteoarthritis   . Polysubstance abuse (HCC)   . Stroke [facility])     Past Surgical History:  Procedure Laterality Date  . GROIN DEBRIDEMENT Left 11/24/2017   Procedure: EVACUATION OF LEFT GROIN HEMATOMA, REPAIR OF LEFT FEMORAL ARTERY BRACH;  Surgeon: Larina Earthly, MD;  Location: [facility] OR;  Service: Vascular;  Laterality: Left;  . KNEE SURGERY    . TRANSCAROTID ARTERY REVASCULARIZATION (TCAR)  11/24/2017  . TRANSCAROTID ARTERY REVASCULARIZATION Right 11/24/2017   Procedure: TRANSCAROTID ARTERY REVASCULARIZATION;  Surgeon: Nada Libman, MD;  Location: [facility] OR;  Service: Vascular;  Laterality: Right;    Family History  Problem Relation Age of Onset  . Diabetes Mother   . Hypertension Mother   . Hypertension Father   . Diabetes Father     Social History   Socioeconomic History  . Marital status: Divorced    Spouse name: Not on file  . Number of children: Not on file  . Years of education: Not on file  . Highest education level: Not on file  Occupational History  . Not on file  Tobacco Use  . Smoking status: Former Smoker    Types: Cigars    Quit date: 06/21/2019    Years since quitting: 0.6  . Smokeless tobacco: Never Used  Vaping Use  . Vaping Use: Never used  Substance and Sexual Activity  . Alcohol use: Yes    Alcohol/week: 1.0 standard drink    Types: 1 Cans of beer per week    Comment: 2 to 3 per week  . Drug use: Yes    Frequency: 7.0 times per week    Types: Marijuana     Comment: once a month usually for pain   . Sexual activity: Not on file  Other Topics Concern  . Not on file  Social History Narrative  . Not on file   Social Determinants of Health   Financial Resource Strain:   . Difficulty of Paying Living Expenses: Not on file  Food Insecurity:   . Worried About Programme researcher, broadcasting/film/video in the Last Year: Not on file  . Ran Out of Food in the Last Year: Not on file  Transportation Needs:   . Lack of Transportation (Medical): Not on file  . Lack of Transportation (Non-Medical): Not on file  Physical Activity:   . Days of Exercise per Week: Not on file  . Minutes of Exercise per Session: Not on file  Stress:   . Feeling of Stress : Not on file  Social Connections:   . Frequency of Communication with Friends and Family: Not on file  . Frequency of Social Gatherings with Friends and Family: Not on file  . Attends Religious Services: Not on file  . Active Member of Clubs or Organizations: Not on file  . Attends Banker Meetings: Not on file  . Marital Status: Not on file  Intimate Partner Violence:   . Fear  of Current or Ex-Partner: Not on file  . Emotionally Abused: Not on file  . Physically Abused: Not on file  . Sexually Abused: Not on file    Outpatient Medications Prior to Visit  Medication Sig Dispense Refill  . aspirin 325 MG tablet Take 1 tablet (325 mg total) by mouth daily. 30 tablet 5  . atorvastatin (LIPITOR) 80 MG tablet TAKE 1 TABLET (80 MG TOTAL) BY MOUTH DAILY AT 6 PM. 90 tablet 0  . clopidogrel (PLAVIX) 75 MG tablet TAKE 1 TABLET (75 MG TOTAL) BY MOUTH DAILY. CALL AND SCHEDULE A FOLLOW UP APPT WITH MD 30 tablet 1  . ezetimibe (ZETIA) 10 MG tablet Take 1 tablet (10 mg total) by mouth daily. 90 tablet 1  . ibuprofen (ADVIL) 200 MG tablet Take 400-600 mg by mouth every 6 (six) hours as needed for headache (pain).    . Aspirin-Acetaminophen-Caffeine (GOODY HEADACHE PO) Take 2 packets by mouth daily as needed  (headache/pain).     No facility-administered medications prior to visit.    No Known Allergies  ROS Review of Systems  Musculoskeletal:       Pain in hands fingers with numbness and tingling   All other systems reviewed and are negative.     Objective:    Physical Exam Vitals reviewed.  Constitutional:      Appearance: Normal appearance.  HENT:     Head: Normocephalic.     Right Ear: Tympanic membrane normal.     Left Ear: Tympanic membrane normal.     Nose: Nose normal.  Eyes:     Extraocular Movements: Extraocular movements intact.  Cardiovascular:     Rate and Rhythm: Normal rate and regular rhythm.     Pulses: Normal pulses.     Heart sounds: Normal heart sounds.  Pulmonary:     Effort: Pulmonary effort is normal.     Breath sounds: Normal breath sounds.  Abdominal:     General: Bowel sounds are normal.     Palpations: Abdomen is soft.  Musculoskeletal:        General: Normal range of motion.  Skin:    General: Skin is warm and dry.  Neurological:     Mental Status: He is alert and oriented to person, place, and time.  Psychiatric:        Mood and Affect: Mood normal.        Behavior: Behavior normal.        Thought Content: Thought content normal.        Judgment: Judgment normal.     BP 112/72 (BP Location: Left Arm, Patient Position: Sitting, Cuff Size: Normal)   Pulse (!) 50   Temp 98.6 F (37 C) (Temporal)   Ht 6' (1.829 m)   Wt 181 lb 6.4 oz (82.3 kg)   SpO2 97%   BMI 24.60 kg/m  Wt Readings from Last 3 Encounters:  02/19/20 181 lb 6.4 oz (82.3 kg)  10/23/19 180 lb (81.6 kg)  01/10/19 171 lb 6.4 oz (77.7 kg)     Health Maintenance Due  Topic Date Due  . Fecal DNA (Cologuard)  Never done  . INFLUENZA VACCINE  01/05/2020    There are no preventive care reminders to display for this patient.  No results found for: TSH Lab Results  Component Value Date   WBC 5.4 07/02/2019   HGB 15.4 07/02/2019   HCT 46.7 07/02/2019   MCV 86  07/02/2019   PLT 319 07/02/2019   Lab Results  Component Value Date   NA 138 07/02/2019   K 4.2 07/02/2019   CO2 20 07/02/2019   GLUCOSE 97 07/02/2019   BUN 9 07/02/2019   CREATININE 0.88 07/02/2019   BILITOT 0.5 07/02/2019   ALKPHOS 83 07/02/2019   AST 18 07/02/2019   ALT 15 07/02/2019   PROT 7.5 07/02/2019   ALBUMIN 4.8 07/02/2019   CALCIUM 9.5 07/02/2019   ANIONGAP 6 11/25/2017   Lab Results  Component Value Date   CHOL 198 07/02/2019   Lab Results  Component Value Date   HDL 74 07/02/2019   Lab Results  Component Value Date   LDLCALC 112 (H) 07/02/2019   Lab Results  Component Value Date   TRIG 67 07/02/2019   Lab Results  Component Value Date   CHOLHDL 2.7 07/02/2019   Lab Results  Component Value Date   HGBA1C 5.6 11/21/2017      Assessment & Plan:  Callum was seen today for carpal tunnel and medication refill.  Diagnoses and all orders for this visit:  Thumb joint locking Negative Phalen's and tinel test - unable to replicate or maneuver thumb to duplicate loccking -     Ambulatory referral to Orthopedic Surgery  Essential hypertension Counseled on blood pressure goal of less than 130/80, low-sodium, DASH diet, medication compliance, 150 minutes of moderate intensity exercise per week. Discussed medication compliance, adverse effects.   Follow-up: Return in about 6 months (around 08/18/2020) for labs /bp.    Grayce Sessions, NP

## 2020-02-21 ENCOUNTER — Other Ambulatory Visit (INDEPENDENT_AMBULATORY_CARE_PROVIDER_SITE_OTHER): Payer: Self-pay | Admitting: Primary Care

## 2020-02-27 MED FILL — CLOPIDOGREL 75 MG TABLET: 75 | 30 days supply | Qty: 30 | Fill #1

## 2020-02-27 MED FILL — EZETIMIBE 10 MG TABS: 10 | 90 days supply | Qty: 90 | Fill #0

## 2020-03-02 ENCOUNTER — Ambulatory Visit (INDEPENDENT_AMBULATORY_CARE_PROVIDER_SITE_OTHER): Payer: Medicaid Other | Admitting: Orthopedic Surgery

## 2020-03-02 DIAGNOSIS — M65311 Trigger thumb, right thumb: Secondary | ICD-10-CM | POA: Diagnosis not present

## 2020-03-02 DIAGNOSIS — M79641 Pain in right hand: Secondary | ICD-10-CM | POA: Diagnosis not present

## 2020-03-04 ENCOUNTER — Encounter: Payer: Self-pay | Admitting: Orthopedic Surgery

## 2020-03-04 DIAGNOSIS — M65311 Trigger thumb, right thumb: Secondary | ICD-10-CM | POA: Diagnosis not present

## 2020-03-04 DIAGNOSIS — M79641 Pain in right hand: Secondary | ICD-10-CM | POA: Diagnosis not present

## 2020-03-04 MED ORDER — METHYLPREDNISOLONE ACETATE 40 MG/ML IJ SUSP
13.3300 mg | INTRAMUSCULAR | Status: AC | PRN
  Administered 2020-03-04: 13.33 mg

## 2020-03-04 MED ORDER — BUPIVACAINE HCL 0.25 % IJ SOLN
0.3300 mL | INTRAMUSCULAR | Status: AC | PRN
  Administered 2020-03-04: .33 mL

## 2020-03-04 MED ORDER — LIDOCAINE HCL 1 % IJ SOLN
3.0000 mL | INTRAMUSCULAR | Status: AC | PRN
  Administered 2020-03-04: 3 mL

## 2020-03-04 NOTE — Progress Notes (Signed)
Office Visit Note   Patient: Nathan Hernandez           Date of Birth: 09/24/1960           MRN: [id number] Visit Date: 03/02/2020 Requested by: Nathan Sessions, NP [address] [place],  [place] [address] PCP: Nathan Sessions, NP  Subjective: Chief Complaint  Patient presents with   trigger finger    HPI: Nathan Hernandez is a 59 year old right-hand-dominant patient with right thumb pain of 2 weeks duration.  Denies any history of injury.  States that his thumb has been locking up for 2 weeks.  Does not take any medication for the problem.  He is on disability for stroke he had.  He does take Plavix.  Denies any numbness and tingling in the thumb.              ROS: All systems reviewed are negative as they relate to the chief complaint within the history of present illness.  Patient denies  fevers or chills.   Assessment & Plan: Visit Diagnoses:  1. Trigger thumb, right thumb     Plan: Impression is right thumb pain with stenosing tenosynovitis at the A1 pulley.  Ultrasound-guided injection performed today.  I think that is got about a 50-50 chance of helping his pain and stopping the locking.  If not he would need to consider outpatient trigger digit release.  Follow-up as needed  Follow-Up Instructions: Return if symptoms worsen or fail to improve.   Orders:  No orders of the defined types were placed in this encounter.  No orders of the defined types were placed in this encounter.     Procedures: Hand/UE Inj: R thumb A1 for trigger finger on 03/04/2020 11:59 AM Indications: therapeutic Details: 25 G needle, ultrasound-guided volar approach Medications: 0.33 mL bupivacaine 0.25 %; 13.33 mg methylPREDNISolone acetate 40 MG/ML; 3 mL lidocaine 1 % Outcome: tolerated well, no immediate complications Procedure, treatment alternatives, risks and benefits explained, specific risks discussed. Consent was given by the patient. Immediately prior to procedure a time out was  called to verify the correct patient, procedure, equipment, support staff and site/side marked as required. Patient was prepped and draped in the usual sterile fashion.       Clinical Data: No additional findings.  Objective: Vital Signs: There were no vitals taken for this visit.  Physical Exam:   Constitutional: Patient appears well-developed HEENT:  Head: Normocephalic Eyes:EOM are normal Neck: Normal range of motion Cardiovascular: Normal rate Pulmonary/chest: Effort normal Neurologic: Patient is alert Skin: Skin is warm Psychiatric: Patient has normal mood and affect    Ortho Exam: Ortho exam demonstrates intact EPL FPL function right thumb.  Does have tenderness of the A1 pulley.  Collateral ligaments are stable at the MCP joint.  Wrist range of motion is intact with palpable radial pulse.   carpal tunnel compression testing negative.  Specialty Comments:  No specialty comments available.  Imaging: No results found.   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/20/2017   Dyslipidemia 11/20/2017   Hypertension 11/20/2017   Polysubstance abuse (HCC) 11/20/2017   HLD (hyperlipidemia) 05/20/2014   Osteoarthritis of right knee 11/27/2013   Establishing care with new doctor, encounter for 04/05/2013   Poor vision 04/05/2013   High blood pressure 04/05/2013   Knee  pain, acute 04/05/2013   Past Medical History:  Diagnosis Date   HLD (hyperlipidemia)    Hypertension    Osteoarthritis    Polysubstance abuse (HCC)    Stroke (HCC)     Family History  Problem Relation Age of Onset   Diabetes Mother    Hypertension Mother    Hypertension Father    Diabetes Father     Past Surgical History:  Procedure Laterality Date   GROIN DEBRIDEMENT Left  11/24/2017   Procedure: EVACUATION OF LEFT GROIN HEMATOMA, REPAIR OF LEFT FEMORAL ARTERY BRACH;  Surgeon: Larina Earthly, MD;  Location: [facility] OR;  Service: Vascular;  Laterality: Left;   KNEE SURGERY     TRANSCAROTID ARTERY REVASCULARIZATION (TCAR)  11/24/2017   TRANSCAROTID ARTERY REVASCULARIZATION Right 11/24/2017   Procedure: TRANSCAROTID ARTERY REVASCULARIZATION;  Surgeon: Nada Libman, MD;  Location: [facility] OR;  Service: Vascular;  Laterality: Right;   Social History   Occupational History   Not on file  Tobacco Use   Smoking status: Former Smoker    Types: Cigars    Quit date: 06/21/2019    Years since quitting: 0.7   Smokeless tobacco: Never Used  Vaping Use   Vaping Use: Never used  Substance and Sexual Activity   Alcohol use: Yes    Alcohol/week: 1.0 standard drink    Types: 1 Cans of beer per week    Comment: 2 to 3 per week   Drug use: Yes    Frequency: 7.0 times per week    Types: Marijuana    Comment: once a month usually for pain    Sexual activity: Not on file

## 2020-03-26 ENCOUNTER — Other Ambulatory Visit (INDEPENDENT_AMBULATORY_CARE_PROVIDER_SITE_OTHER): Payer: Self-pay | Admitting: Family Medicine

## 2020-03-26 ENCOUNTER — Other Ambulatory Visit: Payer: Self-pay | Admitting: Primary Care

## 2020-03-26 DIAGNOSIS — E782 Mixed hyperlipidemia: Secondary | ICD-10-CM

## 2020-03-26 DIAGNOSIS — I639 Cerebral infarction, unspecified: Secondary | ICD-10-CM

## 2020-03-26 MED FILL — CLOPIDOGREL 75 MG TABLET: 75 | 30 days supply | Qty: 30 | Fill #0

## 2020-03-30 ENCOUNTER — Other Ambulatory Visit (INDEPENDENT_AMBULATORY_CARE_PROVIDER_SITE_OTHER): Payer: Self-pay | Admitting: Primary Care

## 2020-03-30 ENCOUNTER — Other Ambulatory Visit (INDEPENDENT_AMBULATORY_CARE_PROVIDER_SITE_OTHER): Payer: Self-pay

## 2020-03-30 DIAGNOSIS — Z1211 Encounter for screening for malignant neoplasm of colon: Secondary | ICD-10-CM | POA: Diagnosis not present

## 2020-03-30 MED FILL — ATORVASTATIN CALCIUM 80 MG: 80 | 90 days supply | Qty: 90 | Fill #0

## 2020-04-01 LAB — FECAL OCCULT BLOOD, IMMUNOCHEMICAL: Fecal Occult Bld: NEGATIVE

## 2020-04-23 ENCOUNTER — Ambulatory Visit (INDEPENDENT_AMBULATORY_CARE_PROVIDER_SITE_OTHER): Payer: Medicaid Other

## 2020-04-23 ENCOUNTER — Other Ambulatory Visit: Payer: Self-pay

## 2020-04-23 DIAGNOSIS — Z23 Encounter for immunization: Secondary | ICD-10-CM | POA: Diagnosis not present

## 2020-04-28 MED FILL — CLOPIDOGREL 75 MG TABLET: 75 | 30 days supply | Qty: 30 | Fill #1

## 2020-05-19 MED FILL — EZETIMIBE 10 MG TABS: 10 | 90 days supply | Qty: 90 | Fill #1

## 2020-06-03 ENCOUNTER — Other Ambulatory Visit: Payer: Self-pay | Admitting: Primary Care

## 2020-06-03 DIAGNOSIS — I639 Cerebral infarction, unspecified: Secondary | ICD-10-CM

## 2020-06-04 MED FILL — CLOPIDOGREL 75 MG TABLET: 75 | 30 days supply | Qty: 30 | Fill #0

## 2020-06-14 ENCOUNTER — Other Ambulatory Visit: Payer: Self-pay

## 2020-06-14 ENCOUNTER — Emergency Department (HOSPITAL_COMMUNITY): Payer: Medicaid Other

## 2020-06-14 ENCOUNTER — Emergency Department (HOSPITAL_COMMUNITY)
Admission: EM | Admit: 2020-06-14 | Discharge: 2020-07-07 | Disposition: E | Payer: Medicaid Other | Attending: Emergency Medicine | Admitting: Emergency Medicine

## 2020-06-14 ENCOUNTER — Encounter (HOSPITAL_COMMUNITY): Payer: Self-pay

## 2020-06-14 DIAGNOSIS — Z87891 Personal history of nicotine dependence: Secondary | ICD-10-CM | POA: Diagnosis not present

## 2020-06-14 DIAGNOSIS — Z79899 Other long term (current) drug therapy: Secondary | ICD-10-CM | POA: Insufficient documentation

## 2020-06-14 DIAGNOSIS — I10 Essential (primary) hypertension: Secondary | ICD-10-CM | POA: Diagnosis not present

## 2020-06-14 DIAGNOSIS — I619 Nontraumatic intracerebral hemorrhage, unspecified: Secondary | ICD-10-CM | POA: Insufficient documentation

## 2020-06-14 DIAGNOSIS — R4020 Unspecified coma: Secondary | ICD-10-CM | POA: Diagnosis present

## 2020-06-14 DIAGNOSIS — J9811 Atelectasis: Secondary | ICD-10-CM | POA: Diagnosis not present

## 2020-06-14 DIAGNOSIS — Z20822 Contact with and (suspected) exposure to covid-19: Secondary | ICD-10-CM | POA: Insufficient documentation

## 2020-06-14 DIAGNOSIS — Z7982 Long term (current) use of aspirin: Secondary | ICD-10-CM | POA: Diagnosis not present

## 2020-06-14 DIAGNOSIS — I629 Nontraumatic intracranial hemorrhage, unspecified: Secondary | ICD-10-CM

## 2020-06-14 DIAGNOSIS — I6522 Occlusion and stenosis of left carotid artery: Secondary | ICD-10-CM | POA: Diagnosis not present

## 2020-06-14 LAB — ETHANOL: Alcohol, Ethyl (B): <10 mg/dL (ref <10)

## 2020-06-14 LAB — CBC WITH DIFFERENTIAL/PLATELET
Abs Immature Granulocytes: 0.09 10*3/uL — ABNORMAL HIGH (ref 0.00–0.07)
Basophils Absolute: 0.1 10*3/uL (ref 0.0–0.1)
Basophils Relative: 1 %
Eosinophils Absolute: 0.1 10*3/uL (ref 0.0–0.5)
Eosinophils Relative: 0 %
HCT: 46.3 % (ref 39.0–52.0)
Hemoglobin: 15.1 g/dL (ref 13.0–17.0)
Immature Granulocytes: 1 %
Lymphocytes Relative: 11 %
Lymphs Abs: 2.1 10*3/uL (ref 0.7–4.0)
MCH: 27.5 pg (ref 26.0–34.0)
MCHC: 32.6 g/dL (ref 30.0–36.0)
MCV: 84.2 fL (ref 80.0–100.0)
Monocytes Absolute: 2.7 10*3/uL — ABNORMAL HIGH (ref 0.1–1.0)
Monocytes Relative: 14 %
Neutro Abs: 14.6 10*3/uL — ABNORMAL HIGH (ref 1.7–7.7)
Neutrophils Relative %: 73 %
Platelets: 330 10*3/uL (ref 150–400)
RBC: 5.5 MIL/uL (ref 4.22–5.81)
RDW: 15 % (ref 11.5–15.5)
WBC: 19.7 10*3/uL — ABNORMAL HIGH (ref 4.0–10.5)
nRBC: 0 % (ref 0.0–0.2)

## 2020-06-14 LAB — RAPID URINE DRUG SCREEN, HOSP PERFORMED
Amphetamines: NOT DETECTED
Barbiturates: NOT DETECTED
Benzodiazepines: NOT DETECTED
Cocaine: NOT DETECTED
Opiates: NOT DETECTED
Tetrahydrocannabinol: POSITIVE — AB

## 2020-06-14 LAB — PROTIME-INR
INR: 1 (ref 0.8–1.2)
Prothrombin Time: 13.1 seconds (ref 11.4–15.2)

## 2020-06-14 LAB — COMPREHENSIVE METABOLIC PANEL
ALT: 78 U/L — ABNORMAL HIGH (ref 0–44)
AST: 72 U/L — ABNORMAL HIGH (ref 15–41)
Albumin: 4.2 g/dL (ref 3.5–5.0)
Alkaline Phosphatase: 76 U/L (ref 38–126)
Anion gap: 17 — ABNORMAL HIGH (ref 5–15)
BUN: 8 mg/dL (ref 6–20)
CO2: 19 mmol/L — ABNORMAL LOW (ref 22–32)
Calcium: 8.9 mg/dL (ref 8.9–10.3)
Chloride: 99 mmol/L (ref 98–111)
Creatinine, Ser: 0.97 mg/dL (ref 0.61–1.24)
GFR, Estimated: >60 mL/min (ref >60)
Glucose, Bld: 198 mg/dL — ABNORMAL HIGH (ref 70–99)
Potassium: 3.8 mmol/L (ref 3.5–5.1)
Sodium: 135 mmol/L (ref 135–145)
Total Bilirubin: 0.7 mg/dL (ref 0.3–1.2)
Total Protein: 7.7 g/dL (ref 6.5–8.1)

## 2020-06-14 LAB — I-STAT ARTERIAL BLOOD GAS, ED
Acid-base deficit: 3 mmol/L — ABNORMAL HIGH (ref 0.0–2.0)
Acid-base deficit: 7 mmol/L — ABNORMAL HIGH (ref 0.0–2.0)
Bicarbonate: 19.9 mmol/L — ABNORMAL LOW (ref 20.0–28.0)
Bicarbonate: 17.3 mmol/L — ABNORMAL LOW (ref 20.0–28.0)
Calcium, Ion: 1.13 mmol/L — ABNORMAL LOW (ref 1.15–1.40)
Calcium, Ion: 1.08 mmol/L — ABNORMAL LOW (ref 1.15–1.40)
HCT: 52 % (ref 39.0–52.0)
HCT: 51 % (ref 39.0–52.0)
Hemoglobin: 17.7 g/dL — ABNORMAL HIGH (ref 13.0–17.0)
Hemoglobin: 17.3 g/dL — ABNORMAL HIGH (ref 13.0–17.0)
O2 Saturation: 98 %
O2 Saturation: 91 %
Potassium: 3.2 mmol/L — ABNORMAL LOW (ref 3.5–5.1)
Potassium: 3.4 mmol/L — ABNORMAL LOW (ref 3.5–5.1)
Sodium: 137 mmol/L (ref 135–145)
Sodium: 137 mmol/L (ref 135–145)
TCO2: 21 mmol/L — ABNORMAL LOW (ref 22–32)
TCO2: 18 mmol/L — ABNORMAL LOW (ref 22–32)
pCO2 arterial: 30 mmHg — ABNORMAL LOW (ref 32.0–48.0)
pCO2 arterial: 30.9 mmHg — ABNORMAL LOW (ref 32.0–48.0)
pH, Arterial: 7.43 (ref 7.350–7.450)
pH, Arterial: 7.357 (ref 7.350–7.450)
pO2, Arterial: 95 mmHg (ref 83.0–108.0)
pO2, Arterial: 62 mmHg — ABNORMAL LOW (ref 83.0–108.0)

## 2020-06-14 LAB — RESP PANEL BY RT-PCR (FLU A&B, COVID) ARPGX2
Influenza A by PCR: NEGATIVE
Influenza B by PCR: NEGATIVE
SARS Coronavirus 2 by RT PCR: NEGATIVE

## 2020-06-14 LAB — ACETAMINOPHEN LEVEL: Acetaminophen (Tylenol), Serum: <10 ug/mL — ABNORMAL LOW (ref 10–30)

## 2020-06-14 LAB — TROPONIN I (HIGH SENSITIVITY)
Troponin I (High Sensitivity): 602 ng/L (ref <18)
Troponin I (High Sensitivity): 544 ng/L (ref <18)

## 2020-06-14 LAB — SALICYLATE LEVEL: Salicylate Lvl: <7 mg/dL — ABNORMAL LOW (ref 7.0–30.0)

## 2020-06-14 LAB — CBG MONITORING, ED: Glucose-Capillary: 244 mg/dL — ABNORMAL HIGH (ref 70–99)

## 2020-06-14 LAB — AMMONIA: Ammonia: 41 umol/L — ABNORMAL HIGH (ref 9–35)

## 2020-06-14 LAB — POC SARS CORONAVIRUS 2 AG -  ED: SARS Coronavirus 2 Ag: NEGATIVE

## 2020-06-14 LAB — LACTIC ACID, PLASMA: Lactic Acid, Venous: 6 mmol/L (ref 0.5–1.9)

## 2020-06-14 MED ORDER — SUCCINYLCHOLINE CHLORIDE 200 MG/10ML IV SOSY
PREFILLED_SYRINGE | INTRAVENOUS | Status: AC
  Filled 2020-06-14: qty 10

## 2020-06-14 MED ORDER — EPINEPHRINE 1 MG/10ML IJ SOSY
PREFILLED_SYRINGE | INTRAMUSCULAR | Status: DC | PRN
  Administered 2020-06-14: 1 via INTRAVENOUS

## 2020-06-14 MED ORDER — IOHEXOL 350 MG/ML SOLN
75.0000 mL | Freq: Once | INTRAVENOUS | Status: AC | PRN
  Administered 2020-06-14: 75 mL via INTRAVENOUS

## 2020-06-14 MED ORDER — LORAZEPAM 2 MG/ML IJ SOLN
INTRAMUSCULAR | Status: AC
  Administered 2020-06-14: 2 mg
  Filled 2020-06-14: qty 1

## 2020-06-14 MED ORDER — SODIUM CHLORIDE 3 % IV BOLUS
250.0000 mL | Freq: Once | INTRAVENOUS | Status: AC
  Administered 2020-06-14: 250 mL via INTRAVENOUS

## 2020-06-14 MED ORDER — SODIUM CHLORIDE 3 % IV SOLN
INTRAVENOUS | Status: DC
  Filled 2020-06-14 (×3): qty 500

## 2020-06-14 MED ORDER — LEVETIRACETAM IN NACL 1000 MG/100ML IV SOLN
1000.0000 mg | INTRAVENOUS | Status: AC
  Administered 2020-06-14 (×2): 1000 mg via INTRAVENOUS
  Filled 2020-06-14: qty 100

## 2020-06-14 MED ORDER — CLEVIDIPINE BUTYRATE 0.5 MG/ML IV EMUL
0.0000 mg/h | INTRAVENOUS | Status: DC
  Administered 2020-06-14 (×2): 1 mg/h via INTRAVENOUS
  Filled 2020-06-14: qty 50

## 2020-06-14 MED ORDER — MANNITOL 20 % IV SOLN
50.0000 g | Freq: Once | Status: AC
  Administered 2020-06-14: 50 g via INTRAVENOUS
  Filled 2020-06-14: qty 250

## 2020-06-14 MED ORDER — ROCURONIUM BROMIDE 10 MG/ML (PF) SYRINGE
PREFILLED_SYRINGE | INTRAVENOUS | Status: AC
  Filled 2020-06-14: qty 10

## 2020-06-14 MED ORDER — ETOMIDATE 2 MG/ML IV SOLN
INTRAVENOUS | Status: AC
  Filled 2020-06-14: qty 20

## 2020-06-14 MED ORDER — PROPOFOL 1000 MG/100ML IV EMUL
5.0000 ug/kg/min | INTRAVENOUS | Status: DC
  Administered 2020-06-14: 5 ug/kg/min via INTRAVENOUS

## 2020-06-16 LAB — I-STAT CHEM 8, ED
BUN: 9 mg/dL (ref 6–20)
Calcium, Ion: 0.97 mmol/L — ABNORMAL LOW (ref 1.15–1.40)
Chloride: 101 mmol/L (ref 98–111)
Creatinine, Ser: 0.7 mg/dL (ref 0.61–1.24)
Glucose, Bld: 200 mg/dL — ABNORMAL HIGH (ref 70–99)
HCT: 51 % (ref 39.0–52.0)
Hemoglobin: 17.3 g/dL — ABNORMAL HIGH (ref 13.0–17.0)
Potassium: 7.4 mmol/L (ref 3.5–5.1)
Sodium: 133 mmol/L — ABNORMAL LOW (ref 135–145)
TCO2: 24 mmol/L (ref 22–32)

## 2020-06-19 LAB — CULTURE, BLOOD (ROUTINE X 2)
Culture: NO GROWTH
Culture: NO GROWTH

## 2020-07-07 NOTE — Progress Notes (Signed)
Call from RN that pt's SATs were in the 70s. Upon RRT's arrival, pt had a clear rhythm change and then proceeded with bradycardia in the 30s. Vent adjusted to 100% FIO2 and RRT notified ED MD of pt's apparent deterioration as code status at this point was not clear. Family at bedside, asked for CPR to be started. CODE BLUE initiated.

## 2020-07-07 NOTE — Progress Notes (Signed)
I spoke with Wynonia Sours, the patient's wife.  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  I explained to her that surgery has been shown to not help with this type of hemorrhage.   She understands that the patient's prognosis is very poor.  I have answered all her questions.

## 2020-07-07 NOTE — ED Provider Notes (Signed)
[facility] Provider Note   CSN: [id number] Arrival date & time: 06/24/2020  1334     History Chief Complaint  Patient presents with  . Loss of Consciousness    Nathan Hernandez is a 60 y.o. male.  The history is provided by the EMS personnel and medical records. No language interpreter was used.  Altered Mental Status Presenting symptoms: unresponsiveness   Severity:  Severe Most recent episode:  Today Episode history:  Unable to specify Timing:  Constant Progression:  Unchanged Chronicity:  New Context: not dementia   Associated symptoms: vomiting   Associated symptoms: no abdominal pain        Past Medical History:  Diagnosis Date  . HLD (hyperlipidemia)   . Hypertension   . Osteoarthritis   . Polysubstance abuse (HCC)   . Stroke [facility](HCC)     Patient Active Problem List   Diagnosis Date Noted  . Carotid artery disease (HCC) 11/25/2017  . Carotid stenosis 11/24/2017  . Hyperlipidemia 11/21/2017  . Marijuana use 11/21/2017  . Acute bilat watershed infarction [facility](HCC) 11/20/2017  . Dyslipidemia 11/20/2017  . Hypertension 11/20/2017  . Polysubstance abuse (HCC) 11/20/2017  . HLD (hyperlipidemia) 05/20/2014  . Osteoarthritis of right knee 11/27/2013  . Establishing care with new doctor, encounter for 04/05/2013  . Poor vision 04/05/2013  . High blood pressure 04/05/2013  . Knee pain, acute 04/05/2013    Past Surgical History:  Procedure Laterality Date  . GROIN DEBRIDEMENT Left 11/24/2017   Procedure: EVACUATION OF LEFT GROIN HEMATOMA, REPAIR OF LEFT FEMORAL ARTERY BRACH;  Surgeon: Larina EarthlyEarly, Todd F, MD;  Location: [facility] OR;  Service: Vascular;  Laterality: Left;  . KNEE SURGERY    . TRANSCAROTID ARTERY REVASCULARIZATION (TCAR)  11/24/2017  . TRANSCAROTID ARTERY REVASCULARIZATION Right 11/24/2017   Procedure: TRANSCAROTID ARTERY REVASCULARIZATION;  Surgeon: Nada LibmanBrabham, Vance W, MD;  [facility];  Service: Vascular;  Laterality:  Right;       Family History  Problem Relation Age of Onset  . Diabetes Mother   . Hypertension Mother   . Hypertension Father   . Diabetes Father     Social History   Tobacco Use  . Smoking status: Former Smoker    Types: Cigars    Quit date: 06/21/2019    Years since quitting: 0.9  . Smokeless tobacco: Never Used  Vaping Use  . Vaping Use: Never used  Substance Use Topics  . Alcohol use: Yes    Alcohol/week: 1.0 standard drink    Types: 1 Cans of beer per week    Comment: 2 to 3 per week  . Drug use: Yes    Frequency: 7.0 times per week    Types: Marijuana    Comment: once a month usually for pain     Home Medications Prior to Admission medications   Medication Sig Start Date End Date Taking? Authorizing Provider  aspirin 325 MG tablet Take 1 tablet (325 mg total) by mouth daily. 01/08/18   Loletta SpecterGomez, Roger David, PA-C  atorvastatin (LIPITOR) 80 MG tablet TAKE 1 TABLET (80 MG TOTAL) BY MOUTH DAILY AT 6 PM. 03/26/20   Hoy RegisterNewlin, Enobong, MD  clopidogrel (PLAVIX) 75 MG tablet TAKE 1 TABLET (75 MG TOTAL) BY MOUTH DAILY. CALL AND SCHEDULE A FOLLOW UP APPT WITH MD 06/03/20   Grayce SessionsEdwards, Michelle P, NP  ezetimibe (ZETIA) 10 MG tablet TAKE 1 TABLET (10 MG TOTAL) BY MOUTH DAILY. 02/21/20   Grayce SessionsEdwards, Michelle P, NP  ibuprofen (ADVIL) 200 MG tablet  Take 400-600 mg by mouth every 6 (six) hours as needed for headache (pain).    [provider]    Allergies    Patient has no known allergies.  Review of Systems   Review of Systems  Unable to perform ROS: Patient unresponsive  Gastrointestinal: Positive for vomiting. Negative for abdominal pain.    Physical Exam Updated Vital Signs BP (!) 177/130 (BP Location: Right Arm)   Pulse 86   Resp 18   SpO2 99%   Physical Exam Vitals and nursing note reviewed.  Constitutional:      Appearance: He is well-developed and well-nourished.  HENT:     Nose: No rhinorrhea.     Mouth/Throat:     Mouth: Mucous membranes are moist.      Pharynx: No oropharyngeal exudate or posterior oropharyngeal erythema.  Eyes:     Extraocular Movements:     Right eye: Abnormal extraocular motion present.     Left eye: Abnormal extraocular motion present.     Conjunctiva/sclera: Conjunctivae normal.     Pupils:     Right eye: Pupil is sluggish.     Left eye: Pupil is sluggish.     Comments: Pupils are 2 mm bilaterally with right gaze preference.  Sluggish pupils  Cardiovascular:     Rate and Rhythm: Normal rate and regular rhythm.     Heart sounds: No murmur heard.   Pulmonary:     Effort: Pulmonary effort is normal. No respiratory distress.     Breath sounds: Rhonchi present.  Chest:     Chest wall: No tenderness.  Abdominal:     Palpations: Abdomen is soft.     Tenderness: There is no abdominal tenderness.  Musculoskeletal:        General: No edema.     Cervical back: Neck supple.  Skin:    General: Skin is warm and dry.  Neurological:     Mental Status: He is unresponsive.     GCS: GCS eye subscore is 1. GCS verbal subscore is 1. GCS motor subscore is 2.     Comments: Patient has extensor posturing with some shaking and eye twitching.  Concern for active seizures on arrival he was unresponsive for  Psychiatric:        Mood and Affect: Mood and affect normal.     ED Results / Procedures / Treatments   Labs (all labs ordered are listed, but only abnormal results are displayed) Labs Reviewed  AMMONIA - Abnormal; Notable for the following components:      Result Value   Ammonia 41 (*)    All other components within normal limits  LACTIC ACID, PLASMA - Abnormal; Notable for the following components:   Lactic Acid, Venous 6.0 (*)    All other components within normal limits  RAPID URINE DRUG SCREEN, HOSP PERFORMED - Abnormal; Notable for the following components:   Tetrahydrocannabinol POSITIVE (*)    All other components within normal limits  SALICYLATE LEVEL - Abnormal; Notable for the following components:    Salicylate Lvl <7.0 (*)    All other components within normal limits  ACETAMINOPHEN LEVEL - Abnormal; Notable for the following components:   Acetaminophen (Tylenol), Serum <10 (*)    All other components within normal limits  CBC WITH DIFFERENTIAL/PLATELET - Abnormal; Notable for the following components:   WBC 19.7 (*)    Neutro Abs 14.6 (*)    Monocytes Absolute 2.7 (*)    Abs Immature Granulocytes 0.09 (*)  All other components within normal limits  COMPREHENSIVE METABOLIC PANEL - Abnormal; Notable for the following components:   CO2 19 (*)    Glucose, Bld 198 (*)    AST 72 (*)    ALT 78 (*)    Anion gap 17 (*)    All other components within normal limits  I-STAT CHEM 8, ED - Abnormal; Notable for the following components:   Sodium 133 (*)    Potassium 7.4 (*)    Glucose, Bld 200 (*)    Calcium, Ion 0.97 (*)    Hemoglobin 17.3 (*)    All other components within normal limits  CBG MONITORING, ED - Abnormal; Notable for the following components:   Glucose-Capillary 244 (*)    All other components within normal limits  I-STAT ARTERIAL BLOOD GAS, ED - Abnormal; Notable for the following components:   pCO2 arterial 30.0 (*)    Bicarbonate 19.9 (*)    TCO2 21 (*)    Acid-base deficit 3.0 (*)    Potassium 3.2 (*)    Calcium, Ion 1.13 (*)    Hemoglobin 17.7 (*)    All other components within normal limits  I-STAT ARTERIAL BLOOD GAS, ED - Abnormal; Notable for the following components:   pCO2 arterial 30.9 (*)    pO2, Arterial 62 (*)    Bicarbonate 17.3 (*)    TCO2 18 (*)    Acid-base deficit 7.0 (*)    Potassium 3.4 (*)    Calcium, Ion 1.08 (*)    Hemoglobin 17.3 (*)    All other components within normal limits  TROPONIN I (HIGH SENSITIVITY) - Abnormal; Notable for the following components:   Troponin I (High Sensitivity) 602 (*)    All other components within normal limits  TROPONIN I (HIGH SENSITIVITY) - Abnormal; Notable for the following components:   Troponin I  (High Sensitivity) 544 (*)    All other components within normal limits  RESP PANEL BY RT-PCR (FLU A&B, COVID) ARPGX2  CULTURE, BLOOD (ROUTINE X 2)  CULTURE, BLOOD (ROUTINE X 2)  ETHANOL  PROTIME-INR  CBC WITH DIFFERENTIAL/PLATELET  LACTIC ACID, PLASMA  BLOOD GAS, ARTERIAL  SODIUM  SODIUM  BLOOD GAS, ARTERIAL  POC SARS CORONAVIRUS 2 AG -  ED    EKG EKG Interpretation  Date/Time:  Sunday 2020/06/20 13:48:31 EST Ventricular Rate:  59 PR Interval:    QRS Duration: 89 QT Interval:  379 QTC Calculation: 376 R Axis:   -18 Text Interpretation: Sinus rhythm Atrial premature complex LAE, consider biatrial enlargement Borderline left axis deviation Repol abnrm suggests ischemia, anterolateral When compared to prior, new ST depresisons. No STEMI Confirmed by Theda Belfast ([id number]) on Jun 20, 2020 2:07:36 PM   Radiology CT ANGIO HEAD W OR WO CONTRAST  Addendum Date: Jun 20, 2020   ADDENDUM REPORT: 2020/06/20 16:23 ADDENDUM: Not included in the impression is aneurysmal dilatation of the ascending thoracic aorta at 4 cm. Recommend annual imaging followup by CTA or MRA. This recommendation follows 2010 ACCF/AHA/AATS/ACR/ASA/SCA/SCAI/SIR/STS/SVM Guidelines for the Diagnosis and Management of Patients with Thoracic Aortic Disease. Circulation. 2010; 121: U045-W098. Aortic aneurysm NOS (ICD10-I71.9) Electronically Signed   By: Guadlupe Spanish M.D.   On: 2020-06-20 16:23   Result Date: 06/20/20 CLINICAL DATA:  Unresponsive EXAM: CT ANGIOGRAPHY HEAD AND NECK TECHNIQUE: Multidetector CT imaging of the head and neck was performed using the standard protocol during bolus administration of intravenous contrast. Multiplanar CT image reconstructions and MIPs were obtained to evaluate the vascular anatomy. Carotid stenosis measurements (when  applicable) are obtained utilizing NASCET criteria, using the distal internal carotid diameter as the denominator. CONTRAST:  3mL OMNIPAQUE IOHEXOL 350 MG/ML SOLN  COMPARISON:  None. FINDINGS: CT HEAD Brain: Large area of hemorrhage in the left frontal, parietal, and temporal lobes centered at the level of the basal ganglia. Measures approximately 9.3 x 4.7 x 6.4 cm. Mass effect is present including effacement of the left lateral ventricle and third ventricle with trapping of the right lateral ventricle. Subfalcine herniation is present with rightward midline shift measuring approximately 1.4 cm. There is intraventricular extension with hemorrhage present throughout. Sulcal and cisternal subarachnoid hemorrhage probably reflects interval recirculation. There is effacement of the basal cisterns. Small chronic infarcts of the right pons and right basal ganglia and adjacent white matter. Vascular: No hyperdense vessel.There is atherosclerotic calcification at the skull base. Skull: Calvarium is unremarkable. Sinuses/Orbits: Left maxillary sinus opacified. Other: None. Review of the MIP images confirms the above findings CTA NECK Aortic arch: Great vessel origins are patent. Right carotid system: Patent. There is a patent stent spanning the common carotid bifurcation and proximal internal carotid without significant in stent stenosis. Left carotid system: Patent. Atherosclerotic wall thickening and minimal calcified plaque along the common carotid. There is mixed plaque along the proximal internal carotid causing up to 40% stenosis, which has increased. Vertebral arteries: Patent and codominant. Skeleton: Multilevel degenerative changes of the cervical spine. Other neck: No mass or adenopathy Upper chest: Ascending thoracic aorta measures top normal at 4 cm. No apical lung mass. Review of the MIP images confirms the above findings CTA HEAD There is some curvilinear enhancement within the hematoma (series 11, images 91-97). Anterior circulation: Intracranial internal carotid arteries are patent with calcified plaque causing up to moderate stenosis. Anterior and middle cerebral  arteries are patent. Posterior circulation: Intracranial vertebral arteries are patent. Plaque results in increased moderate to marked stenosis. Basilar artery is patent with increased moderate stenosis. Posterior cerebral arteries are patent. A right posterior communicating artery is identified Venous sinuses: Not well evaluated. Review of the MIP images confirms the above findings IMPRESSION: Large area of left cerebral hemorrhage centered at the level of the basal ganglia with large volume intraventricular extension. Mass effect results in 1.4 cm rightward midline shift with trapping of the right lateral ventricle. There is effacement of the basal cisterns. Sulcal and cisternal subarachnoid hemorrhage likely reflects recirculation. Focal curvilinear enhancement within the hematoma could reflect an area of active extravasation. Patent stent spanning right ICA origin. Increased mixed plaque along the proximal left ICA causing up to 40% stenosis. Plaque along the intracranial vertebral arteries causing increased moderate to marked stenosis. Increased moderate basilar artery stenosis. These results were called by telephone at the time of interpretation on 2020-07-02 at 2:41 pm to provider [facility] , who verbally acknowledged these results. Electronically Signed: By: Guadlupe Spanish M.D. On: 02-Jul-2020 15:10   CT ANGIO NECK W OR WO CONTRAST  Addendum Date: July 02, 2020   ADDENDUM REPORT: 07/02/20 16:23 ADDENDUM: Not included in the impression is aneurysmal dilatation of the ascending thoracic aorta at 4 cm. Recommend annual imaging followup by CTA or MRA. This recommendation follows 2010 ACCF/AHA/AATS/ACR/ASA/SCA/SCAI/SIR/STS/SVM Guidelines for the Diagnosis and Management of Patients with Thoracic Aortic Disease. Circulation. 2010; 121: B716-R678. Aortic aneurysm NOS (ICD10-I71.9) Electronically Signed   By: Guadlupe Spanish M.D.   On: 07/02/20 16:23   Result Date: 07-02-2020 CLINICAL DATA:  Unresponsive  EXAM: CT ANGIOGRAPHY HEAD AND NECK TECHNIQUE: Multidetector CT imaging of the head and neck  was performed using the standard protocol during bolus administration of intravenous contrast. Multiplanar CT image reconstructions and MIPs were obtained to evaluate the vascular anatomy. Carotid stenosis measurements (when applicable) are obtained utilizing NASCET criteria, using the distal internal carotid diameter as the denominator. CONTRAST:  28mL OMNIPAQUE IOHEXOL 350 MG/ML SOLN COMPARISON:  None. FINDINGS: CT HEAD Brain: Large area of hemorrhage in the left frontal, parietal, and temporal lobes centered at the level of the basal ganglia. Measures approximately 9.3 x 4.7 x 6.4 cm. Mass effect is present including effacement of the left lateral ventricle and third ventricle with trapping of the right lateral ventricle. Subfalcine herniation is present with rightward midline shift measuring approximately 1.4 cm. There is intraventricular extension with hemorrhage present throughout. Sulcal and cisternal subarachnoid hemorrhage probably reflects interval recirculation. There is effacement of the basal cisterns. Small chronic infarcts of the right pons and right basal ganglia and adjacent white matter. Vascular: No hyperdense vessel.There is atherosclerotic calcification at the skull base. Skull: Calvarium is unremarkable. Sinuses/Orbits: Left maxillary sinus opacified. Other: None. Review of the MIP images confirms the above findings CTA NECK Aortic arch: Great vessel origins are patent. Right carotid system: Patent. There is a patent stent spanning the common carotid bifurcation and proximal internal carotid without significant in stent stenosis. Left carotid system: Patent. Atherosclerotic wall thickening and minimal calcified plaque along the common carotid. There is mixed plaque along the proximal internal carotid causing up to 40% stenosis, which has increased. Vertebral arteries: Patent and codominant. Skeleton:  Multilevel degenerative changes of the cervical spine. Other neck: No mass or adenopathy Upper chest: Ascending thoracic aorta measures top normal at 4 cm. No apical lung mass. Review of the MIP images confirms the above findings CTA HEAD There is some curvilinear enhancement within the hematoma (series 11, images 91-97). Anterior circulation: Intracranial internal carotid arteries are patent with calcified plaque causing up to moderate stenosis. Anterior and middle cerebral arteries are patent. Posterior circulation: Intracranial vertebral arteries are patent. Plaque results in increased moderate to marked stenosis. Basilar artery is patent with increased moderate stenosis. Posterior cerebral arteries are patent. A right posterior communicating artery is identified Venous sinuses: Not well evaluated. Review of the MIP images confirms the above findings IMPRESSION: Large area of left cerebral hemorrhage centered at the level of the basal ganglia with large volume intraventricular extension. Mass effect results in 1.4 cm rightward midline shift with trapping of the right lateral ventricle. There is effacement of the basal cisterns. Sulcal and cisternal subarachnoid hemorrhage likely reflects recirculation. Focal curvilinear enhancement within the hematoma could reflect an area of active extravasation. Patent stent spanning right ICA origin. Increased mixed plaque along the proximal left ICA causing up to 40% stenosis. Plaque along the intracranial vertebral arteries causing increased moderate to marked stenosis. Increased moderate basilar artery stenosis. These results were called by telephone at the time of interpretation on 2020-07-02 at 2:41 pm to provider [facility] , who verbally acknowledged these results. Electronically Signed: By: Guadlupe Spanish M.D. On: 07/02/2020 15:10   DG Chest Portable 1 View  Result Date: 2020/07/02 CLINICAL DATA:  Unresponsive EXAM: PORTABLE CHEST 1 VIEW COMPARISON:  April 12, 2018, May fourth 2017, July 19, 2019. FINDINGS: The cardiomediastinal silhouette is unchanged in contour. Elevation of the RIGHT hemidiaphragm ETT tip terminates 5 cm above the carina. Enteric tube side port projects just at the GE junction. No pleural effusion. No pneumothorax. Patchy RIGHT basilar heterogeneous opacities. Scattered LEFT basilar linear opacities. Visualized abdomen  is unremarkable. No acute osseous abnormality noted. IMPRESSION: 1. Enteric tube side port projects just at the GE junction. Recommend advancement. 2. ETT tip terminates 5 cm above the carina. 3. Patchy RIGHT basilar heterogeneous opacities, nonspecific. Differential considerations include aspiration, atelectasis or less likely infection. 4. Scattered LEFT basilar atelectasis. Electronically Signed   By: Meda KlinefelterStephanie  Peacock MD   On: February 26, 2021 15:30    Procedures Procedure Name: Intubation Date/Time: 06/26/2020 3:03 PM Performed by: Heide Scalesegeler, Jerzey Komperda J, MD Pre-anesthesia Checklist: Emergency Drugs available, Suction available, Patient being monitored, Timeout performed and Patient identified Oxygen Delivery Method: Ambu bag Preoxygenation: Pre-oxygenation with 100% oxygen Induction Type: Rapid sequence Ventilation: Mask ventilation without difficulty Laryngoscope Size: Glidescope Grade View: Grade I Tube size: 7.5 mm Number of attempts: 1 Placement Confirmation: ETT inserted through vocal cords under direct vision,  CO2 detector and Breath sounds checked- equal and bilateral Secured at: 21 cm Tube secured with: ETT holder Dental Injury: Teeth and Oropharynx as per pre-operative assessment       (including critical care time)  CRITICAL CARE Performed by: Canary Brimhristopher J Xadrian Craighead Total critical care time: 65 minutes Critical care time was exclusive of separately billable procedures and treating other patients. Critical care was necessary to treat or prevent imminent or life-threatening  deterioration. Critical care was time spent personally by me on the following activities: development of treatment plan with patient and/or surrogate as well as nursing, discussions with consultants, evaluation of patient's response to treatment, examination of patient, obtaining history from patient or surrogate, ordering and performing treatments and interventions, ordering and review of laboratory studies, ordering and review of radiographic studies, pulse oximetry and re-evaluation of patient's condition.   Cardiopulmonary Resuscitation (CPR) Procedure Note Directed/Performed by: Canary Brimhristopher J Keyairra Kolinski I personally directed ancillary staff and/or performed CPR in an effort to regain return of spontaneous circulation and to maintain cardiac, neuro and systemic perfusion.    Medications Ordered in ED Medications  clevidipine (CLEVIPREX) infusion 0.5 mg/mL (1 mg/hr Intravenous New Bag/Given 07/03/2020 1612)  succinylcholine (ANECTINE) 200 MG/10ML syringe (0 mg  Hold 06/16/2020 1547)  sodium chloride (hypertonic) 3 % solution ( Intravenous New Bag/Given 07/02/2020 1614)  propofol (DIPRIVAN) 1000 MG/100ML infusion (5 mcg/kg/min Intravenous New Bag/Given 06/09/2020 1522)  EPINEPHrine (ADRENALIN) 1 MG/10ML injection (1 Syringe Intravenous Given 06/22/2020 1702)  LORazepam (ATIVAN) 2 MG/ML injection (2 mg  Given 06/22/2020 1436)  levETIRAcetam (KEPPRA) IVPB 1000 mg/100 mL premix (0 mg Intravenous Stopped 06/26/2020 1436)  iohexol (OMNIPAQUE) 350 MG/ML injection 75 mL (75 mLs Intravenous Contrast Given 06/19/2020 1426)  rocuronium bromide 100 MG/10ML SOSY (  Given 07/05/2020 1444)  etomidate (AMIDATE) 2 MG/ML injection (  Given 06/28/2020 1444)  sodium chloride 3% (hypertonic) IV bolus 250 mL (0 mLs Intravenous Stopped 06/16/2020 1538)  mannitol 20 % IVPB 50 g (0 g Intravenous Stopped 07/03/2020 1613)    ED Course  I have reviewed the triage vital signs and the nursing notes.  Pertinent labs & imaging results that were available during my  care of the patient were reviewed by me and considered in my medical decision making (see chart for details).    MDM Rules/Calculators/A&P                          Nathan LegacyMichael Hernandez is a 60 y.o. male with a past medical history significant for hypertension, hyperlipidemia, polysubstance abuse, carotid disease status post transcarotid artery revascularization and prior stroke who presents for altered mental status.  According to  EMS, patient was found down on the floor in his bedroom with a pile of vomit next to him.  Unknown last known well.  Family is reportedly on the way but have not yet spoken to them.  Patient was found to be hypertensive in route with a low GCS.  He had pinpoint pupils so EMS gave him 2 of Narcan however there was no response.  Patient had some extensor posturing in route.  On arrival, GCS is 4 with extensor posturing.  He does have some shaking and eye twitching with a right gaze preference.  He was no longer vomiting but he was hypoxic.  He is now on a nonrebreather and his oxygen saturations are staying in the 90s.  He is not tachycardic.  He was given 2 of Ativan and his seizures appear to stop.  Patient may be postictal.  We will hold on intubation to see if he starts waking up as his oxygen saturations are now being maintained on nonrebreather.  Blood pressure started to rise and was around 230 systolic.  Decision made to start him on Cleviprex in the event that this is a intracranial hemorrhage as I suspect with his gaze preference.  Rest of exam patient was unresponsive to any pain.  Lungs were coarse bilaterally.  Abdomen was nontender.  Pupils were looking to the right and were 2 mm bilaterally.  Sluggish.  Patient quickly went to CT scanner where he was found to have a massive intracranial hemorrhage.  Neurology is coming to the bedside and request to be intubated now for airway protection.  We will intubate.  Just received information that the patient's wife's name is  Britta Mccreedy and her number is [phone] and the patient's son, Hulan Amato, is somewhere in the emergency department.  Will secure airway and discussed with neurology of plan and informed family.  3:02 PM Patient was intubated without difficulty.  Patient was tachycardic after intubation.  We will get postintubation x-ray and start propofol for sedation.  Neurosurgery will leave a note on the patient with the recommendations.  Patient will be mated to ICU for further management.  Rapid COVID test was negative but 2-hour test was also sent due to being admitted and getting intubated.  5:08 PM While awaiting admission to the ICU, patient began to bradycardia down.  Heart rate went to the 30s before he went into asystole.  We rushed into the room and family initially said they wanted to do CPR.  CPR was started and he received approximately 2 minutes of compressions.  As were getting ready to push epinephrine, the patient's 2 children came into the room and said they made decision that they want to terminate resuscitative efforts.  Pulse was checked and he did not have a pulse.  Patient had no spontaneous breathing and did not respond to pain.  He was still was in asystole.  Time of death was declared at 5:03 PM.  Family was all at the bedside at time of death.  Will try to call the PCP to discuss death certificate.  5:17 PM Secretary team tried to call the PCP without success.  We will try again later.  Was unable to get a hold of PCP.  Either PCP office or emergency team will fill out the death certificate as the patient had a known cause of death with intracerebral hemorrhage leading to herniation.    Final Clinical Impression(s) / ED Diagnoses Final diagnoses:  Nontraumatic intracerebral hemorrhage, unspecified cerebral location, unspecified laterality (  HCC)    Clinical Impression: 1. Nontraumatic intracerebral hemorrhage, unspecified cerebral location, unspecified laterality (HCC)      Disposition: Expired  This note was prepared with assistance of Dragon voice recognition software. Occasional wrong-word or sound-a-like substitutions may have occurred due to the inherent limitations of voice recognition software.     Kahleb Mcclane, Canary Brim, MD 06-19-2020 2130

## 2020-07-07 NOTE — Consult Note (Signed)
I was contacted by Dr. Rush Landmark regarding this patient.   The patient is in the process of being intubated. I have reviewed his head CT.   Unfortunately he has a large left basal ganglia hemorrhage with severe brainstem/ mid brain compression and midline shift.  There is some interventricular blood and mild right ventricular enlargement.  There is no significant hydrocephalus.  Unfortunately this almost certainly is a fatal bleed.  No operative intervention is indicated presently.   I discussed this with Dr. Rush Landmark.  Please call if I can be of further assistance.

## 2020-07-07 NOTE — ED Notes (Signed)
21 at lip

## 2020-07-07 NOTE — Consult Note (Signed)
Neurology Consultation Reason for Consult: seizure disorder/ large left cerebral  bleed Requesting Physician: tegeler   CC: left cerebral bleed.   History is obtained from:patient's chart as well as patient's wife via telephone.   HPI: Nathan Hernandez is a 60 y.o. male  With history of hypertension, multiple right sided small watershed infarcts as well as chronic bilateral basal ganglia strokes, known bilateal carotid stenosis s/p right TCAR on 11/24/17 and known left ICA stenosis 40-59%, who presents by ambulance to the ED after being found down at home today with vomit near him. He was unresponsive. In the ED he was unresponsive, placed on non- rebreather. He was seen with small localized seizures and was given a loading dose of keppra. He was taken for CT scan of head which showed a large, presumptively hypertensive left basal ganglia hemorrhage, ICH score 4.    On approach he was unresponsive to verbal and noxious stimuli.  We spoke to his wife via phone as she is en route to this hospital.    LKW: per wife, this am tPA given?: No  ICH Score: 4  Time performed: 1450 GCS: 3-4 is 2 points Infratentorial: No.. If yes, 1 point Volume: >30cc is 1 point  Age: 60 y.o.. >80 is 1 point Intraventricular extension is 1 point  Score:4  A Score of 4 points has a 30 day mortality of 97%. Stroke. 2001 Apr;32(4):891-7.    ROS: Unable to obtain due to altered mental status.   Past Medical History:  Diagnosis Date  . HLD (hyperlipidemia)   . Hypertension   . Osteoarthritis   . Polysubstance abuse (HCC)   . Stroke [facility])       Family History  Problem Relation Age of Onset  . Diabetes Mother   . Hypertension Mother   . Hypertension Father   . Diabetes Father       Social History:  reports that he quit smoking about a year ago. His smoking use included cigars. He has never used smokeless tobacco. He reports current alcohol use of about 1.0 standard drink of alcohol per week. He  reports current drug use. Frequency: 7.00 times per week. Drug: Marijuana.    Exam: Current vital signs: BP (!) 177/130 (BP Location: Right Arm)   Pulse 86   Resp 18   SpO2 99%  Vital signs in last 24 hours: Pulse Rate:  [86] 86 (01/09 1340) Resp:  [18] 18 (01/09 1340) BP: (177)/(130) 177/130 (01/09 1340) SpO2:  [99 %] 99 % (01/09 1340)   Physical Exam  GCS 4 Constitutional: Appears well-developed and well-nourished.  Psych: nonresponsive to verbal stimuli. Eyes: No scleral injection HENT: No OP obstrucion MSK: no joint deformities.  Cardiovascular: sinus tachycardia  Respiratory: intubated GI: Soft.  No distension. There is no tenderness.  Skin: WDI  Neuro: Mental Status: Patient is intubated. Unresponsive toverbal and noxious stimuli. Patient is unable to give a clear and coherent history.    Cranial Nerves: II: pupils are not reacti e to light III,IV, VI: has right gaze preference.  V: Facial sensation is symmetric to temperature VII: Facial movement is symmetric.  VIII: unable to assess  X: unable to assess  XI: unable to assess  XII:  Unable to assess  Motor: Has minimal extensor posturing at right arm to deep noxious stimuli. Sensory: Unable to assess Deep Tendon Reflexes: 2+ and symmetric in the biceps and patellae.   Plantars: Toes are mute Cerebellar: Unable to assess   NIHSS total 36  I have reviewed labs in epic and the results pertinent to this consultation are: Results for Nathan, Hernandez (MRN [id number]) as of 06-16-2020 14:59  Ref. Range 06-16-2020 14:04  Sodium Latest Ref Range: 135 - 145 mmol/L 137  Potassium Latest Ref Range: 3.5 - 5.1 mmol/L 3.2 (L)   Results for Nathan, Hernandez (MRN [id number]) as of 06-16-20 14:59  Ref. Range 2020-06-16 14:00  Glucose Latest Ref Range: 70 - 99 mg/dL 762 (H)  BUN Latest Ref Range: 6 - 20 mg/dL 9  Creatinine Latest Ref Range: 0.61 - 1.24 mg/dL 2.63  Calcium Ionized Latest Ref Range: 1.15 - 1.40  mmol/L 0.97 (L)   Results for Nathan, Hernandez (MRN [id number]) as of 06/16/20 14:59  Ref. Range June 16, 2020 14:00  Hemoglobin Latest Ref Range: 13.0 - 17.0 g/dL 38.9 (H)  HCT Latest Ref Range: 39.0 - 52.0 % 51.0   I have reviewed the images obtained:  Ct without contrast head shows Large area of hemorrhage in the left frontal, parietal, andtemporal lobes centered at the level of the basal ganglia. Measures approximately 9.3 x 4.7 x 6.4 cm. Mass effect is present with right shift deviation and trapping of the right lateral ventricle.  CTA head and neck shows patent span of Right ICA origin and left ICA causing up to 40% stenosis.      Impression: 60 year old male with history of hypertension, multiple prior strokes, right TCAR and known left ICA stenosis admitted with large cerebral bleed, ICH score 4.   Recommendations: -  Spoke with neurosurgery: no plans for surgical intervention.they will come to speak with family regarding plan for care -Hypertonic saline infusions to goal sodium 155, 3% bolus then infusion. Consider 23% after central line placement. Cerebral edema Protocol in place. Mannitol 20% 1mg /kg ordered for cerebral edema. - we had family discussion regarding plan of care. Seizure precautions.  We will continue to follow.

## 2020-07-07 DEATH — deceased

## 2020-08-18 ENCOUNTER — Ambulatory Visit (INDEPENDENT_AMBULATORY_CARE_PROVIDER_SITE_OTHER): Payer: Medicaid Other | Admitting: Primary Care

## 2023-06-06 ENCOUNTER — Other Ambulatory Visit (HOSPITAL_COMMUNITY): Payer: Self-pay
